# Patient Record
Sex: Male | Born: 1935 | Race: Black or African American | Hispanic: No | Smoking: Never smoker
Health system: Southern US, Community
[De-identification: ages and names within clinical notes are randomized; demographics above are authoritative.]

## PROBLEM LIST (undated history)

## (undated) DIAGNOSIS — R600 Localized edema: Secondary | ICD-10-CM

## (undated) DIAGNOSIS — I5189 Other ill-defined heart diseases: Secondary | ICD-10-CM

## (undated) DIAGNOSIS — M1990 Unspecified osteoarthritis, unspecified site: Secondary | ICD-10-CM

## (undated) DIAGNOSIS — G459 Transient cerebral ischemic attack, unspecified: Secondary | ICD-10-CM

## (undated) DIAGNOSIS — E785 Hyperlipidemia, unspecified: Secondary | ICD-10-CM

## (undated) DIAGNOSIS — R7303 Prediabetes: Secondary | ICD-10-CM

## (undated) DIAGNOSIS — I429 Cardiomyopathy, unspecified: Secondary | ICD-10-CM

## (undated) DIAGNOSIS — E559 Vitamin D deficiency, unspecified: Secondary | ICD-10-CM

## (undated) DIAGNOSIS — I10 Essential (primary) hypertension: Secondary | ICD-10-CM

## (undated) DIAGNOSIS — H409 Unspecified glaucoma: Secondary | ICD-10-CM

## (undated) DIAGNOSIS — D649 Anemia, unspecified: Secondary | ICD-10-CM

## (undated) DIAGNOSIS — F0390 Unspecified dementia without behavioral disturbance: Secondary | ICD-10-CM

## (undated) HISTORY — PX: TONSILLECTOMY: SUR1361

## (undated) HISTORY — DX: Other ill-defined heart diseases: I51.89

## (undated) HISTORY — DX: Hyperlipidemia, unspecified: E78.5

## (undated) HISTORY — DX: Unspecified glaucoma: H40.9

## (undated) HISTORY — DX: Prediabetes: R73.03

## (undated) HISTORY — PX: TRANSURETHRAL RESECTION OF PROSTATE: SHX73

## (undated) HISTORY — PX: CATARACT EXTRACTION: SUR2

## (undated) HISTORY — DX: Anemia, unspecified: D64.9

## (undated) HISTORY — PX: CERVICAL LAMINECTOMY: SHX94

## (undated) HISTORY — DX: Cardiomyopathy, unspecified: I42.9

## (undated) HISTORY — DX: Vitamin D deficiency, unspecified: E55.9

---

## 1985-12-13 HISTORY — PX: LUMBAR SPINE SURGERY: SHX701

## 1998-04-21 ENCOUNTER — Other Ambulatory Visit: Admission: RE | Admit: 1998-04-21 | Discharge: 1998-04-21 | Payer: Self-pay | Admitting: *Deleted

## 1998-04-22 ENCOUNTER — Ambulatory Visit (HOSPITAL_COMMUNITY): Admission: RE | Admit: 1998-04-22 | Discharge: 1998-04-22 | Payer: Self-pay | Admitting: *Deleted

## 1998-04-25 ENCOUNTER — Other Ambulatory Visit: Admission: RE | Admit: 1998-04-25 | Discharge: 1998-04-25 | Payer: Self-pay | Admitting: *Deleted

## 2001-06-02 ENCOUNTER — Encounter: Payer: Self-pay | Admitting: Internal Medicine

## 2001-06-02 ENCOUNTER — Ambulatory Visit (HOSPITAL_COMMUNITY): Admission: RE | Admit: 2001-06-02 | Discharge: 2001-06-02 | Payer: Self-pay | Admitting: Internal Medicine

## 2001-06-26 ENCOUNTER — Ambulatory Visit (HOSPITAL_COMMUNITY): Admission: RE | Admit: 2001-06-26 | Discharge: 2001-06-26 | Payer: Self-pay | Admitting: Gastroenterology

## 2003-09-12 ENCOUNTER — Encounter: Payer: Self-pay | Admitting: Orthopedic Surgery

## 2003-09-13 HISTORY — PX: TOTAL KNEE ARTHROPLASTY: SHX125

## 2003-09-17 ENCOUNTER — Inpatient Hospital Stay (HOSPITAL_COMMUNITY): Admission: RE | Admit: 2003-09-17 | Discharge: 2003-09-20 | Payer: Self-pay | Admitting: Orthopedic Surgery

## 2003-09-17 ENCOUNTER — Encounter: Payer: Self-pay | Admitting: Orthopedic Surgery

## 2003-09-20 ENCOUNTER — Inpatient Hospital Stay (HOSPITAL_COMMUNITY)
Admission: RE | Admit: 2003-09-20 | Discharge: 2003-09-27 | Payer: Self-pay | Admitting: Physical Medicine & Rehabilitation

## 2004-06-17 ENCOUNTER — Ambulatory Visit (HOSPITAL_COMMUNITY): Admission: RE | Admit: 2004-06-17 | Discharge: 2004-06-17 | Payer: Self-pay | Admitting: Internal Medicine

## 2005-10-28 ENCOUNTER — Emergency Department (HOSPITAL_COMMUNITY): Admission: EM | Admit: 2005-10-28 | Discharge: 2005-10-28 | Payer: Self-pay | Admitting: Emergency Medicine

## 2007-06-09 ENCOUNTER — Encounter: Admission: RE | Admit: 2007-06-09 | Discharge: 2007-06-09 | Payer: Self-pay | Admitting: Orthopedic Surgery

## 2009-07-14 ENCOUNTER — Encounter: Admission: RE | Admit: 2009-07-14 | Discharge: 2009-07-14 | Payer: Self-pay | Admitting: Internal Medicine

## 2011-04-30 NOTE — Procedures (Signed)
[facility]  Patient:    Joshua Norton, Joshua Norton                         MRN: [id number] Proc. Date: 06/26/01 Attending:  Petra Kuba, M.D. CC:         Marinus Maw, M.D.   Procedure Report  PROCEDURE:  Colonoscopy.  INDICATIONS:  Patient with a history of colon polyps due for repeat screening.  INFORMED CONSENT:  Consent was signed after risk, benefits, methods and options were thoroughly discussed on multiple occasions in the past.  MEDICINES USED:  Demerol 40 mg, Versed 5 mg.  DESCRIPTION OF PROCEDURE:  Rectal inspection was pertinent for external hemorrhoids.  Digital exam was negative.  The video colonoscope was inserted and easily advanced around the colon to the cecum.  This did require some left abdominal pressure but no position changes.  The cecum was identified by the appendiceal orifice and the ileocecal valve.  No obvious abnormality was seen on insertion.  The scope was slowly withdrawn.  The prep was fairly adequate. Die require lots of washing and suctioning to get adequate visualization.  On slow withdrawal through the colon, other than some occasional diverticula, no polyps were seen.  Once back in the colon, the scope was retroflexed, pertinent for some internal hemorrhoids.  The scope was straightened and readvanced a short ways up the sigmoid.  Air was suctioned and the scope removed.  The patient tolerated the procedure well, and there was no obvious immediate complication.  ENDOSCOPIC DIAGNOSES: 1. External and internal hemorrhoids. 2. Fairly adequate prep. 3. Occasional diverticula, more in the mid colon 4. Otherwise within normal limits to the cecum.  PLAN:  Yearly rectals and guaiacs per Dr. Oneta Rack.  Happy to see back p.r.n. Based on other medical problems, consider repeat screening in five years. DD:  06/26/01 TD:  06/27/01 Job: [id number]

## 2011-04-30 NOTE — Op Note (Signed)
NAME:  VERLE, WHEELING NO.:  [id number]   MEDICAL RECORD NO.:  [id number]                   PATIENT TYPE:  INP   LOCATION:  2899                                 FACILITY:  [facility]   PHYSICIAN:  Burnard Bunting, M.D.                 DATE OF BIRTH:  [birth year]   DATE OF PROCEDURE:  09/17/2003  DATE OF DISCHARGE:                                 OPERATIVE REPORT   PREOPERATIVE DIAGNOSIS:  Left knee arthritis.   POSTOPERATIVE DIAGNOSIS:  Left knee arthritis.   OPERATION PERFORMED:  Left total knee arthroplasty.   SURGEON:  Burnard Bunting, M.D.   ASSISTANT:  Jerolyn Shin. Tresa Res, M.D.   ANESTHESIA:  General endotracheal plus postoperative femoral nerve block  plus intra-articular Marcaine and morphine.   ESTIMATED BLOOD LOSS:  .   DRAINS:  Hemovac times one.   TOURNIQUET TIME:  One hour and 42 minutes at .   DESCRIPTION OF PROCEDURE:  The patient was brought to the operating room  where general endotracheal anesthesia was induced.  Preoperative IV  antibiotics were administered.  The left leg including the foot was prepped  with DuraPrep solution and draped in a sterile manner.  The operative field  was covered with Ioban.  The leg was elevated and exsanguinated with the  Esmarch wrap.  The tourniquet was inflated.  Anterior incision was made.  Median parapatellar arthrotomy was performed.  Precise location of the  arthrotomy was marked with a #1 Vicryl suture at the superomedial border of  the patella.  The lateral aspect of the fat pad was then excised for  visualization.  The lateral patellofemoral ligament was released, distal  anterior aspect of the femur was cleared of soft tissue for visualization of  the anterior Chamfer cut.  The ACL and PCL were released.  Medial periosteal  stripping was performed because of the patient's preoperative varus  contracture.  At this time the knee was flexed and patella was everted.  Distal femoral cut  12 mm was then performed in 5 degrees of valgus.  Collateral ligaments were protected.  The rotational alignment guide was  then placed and the femur was noted to fit well with a size 11.  External  rotation was placed parallel to the epicondylar axis.  The anterior,  posterior and Chamfer cuts were then performed.  At this time the tibia was  then prepared with collateral ligaments well retracted.  A size 9 tibial  tray was noted to fit well on the tibial surface.  Intramedullary alignment  was utilized.  A 10 mm resection was made off of the least affected lateral  tibial plateau.  At this time the box cut was made on the femur.  The  remnant PCL was excised.  The patellar cut was then made using free hand  technique.  A 28 mm cut was then taken down  to a 20 mm patella was taken  down to a 15 mm patella.  A size 9 patellar trial fit nicely on the patella.  At this time the trial inserts were placed.  The patient had excellent range  of motion with no lift off and good patellar tracking.  Rotation of the  tibial tray was marked.  A keel punch was performed.  Cut bony surfaces were  irrigated, components were then cemented into position with a size 9 tibial  tray, 11 femur, 9 patella with 10 mm inserts. Excess cement was removed.  Cement was allowed to harden and the real 10 polyethylene insert was then  placed into position.  Again the patella had good tracking.  The knee had  about 2 to 3 degrees of hyperextension and the collateral ligament stability  was excellent in both full extension, 30 degrees of flexion, 90 degrees of  flexion.  At this time tourniquet was released.  Bleedings points  encountered were controlled using electrocautery.  Joint was thoroughly  irrigated.  __________ patellar arthrotomy was closed with the knee in 30  degrees of flexion using #1 Vicryl suture.  At this time the Hemovac drain  was placed.  The skin was then closed using interrupted inverted 2-0 Vicryl   followed by skin staples.  Knee immobilizer was placed.  It should be noted  that 20mL of 0.25% Marcaine without epinephrine and 8 mg of morphine was  injected into the knee prior to placing the bulky compressive dressing and  knee immobilizer.                                                Burnard Bunting, M.D.    GSD/MEDQ  D:  09/17/2003  T:  09/17/2003  Job:  [id number]

## 2011-04-30 NOTE — Discharge Summary (Signed)
   NAME:  Joshua Norton, Joshua Norton NO.:  [id number]   MEDICAL RECORD NO.:  [id number]                   PATIENT TYPE:  INP   LOCATION:  5017                                 FACILITY:  [facility]   PHYSICIAN:  Burnard Bunting, M.D.                 DATE OF BIRTH:  [birth year]   DATE OF ADMISSION:  09/17/2003  DATE OF DISCHARGE:  09/20/2003                                 DISCHARGE SUMMARY   DISCHARGE DIAGNOSIS:  Left knee arthritis.   SECONDARY DIAGNOSES:  1. Hypertension.  2. Coronary artery disease.  3. Gout.   OPERATIONS/PROCEDURES:  Left total knee replacement performed on September 17, 2003.   HOSPITAL COURSE:  The patient was admitted to the orthopaedic service on  September 17, 2003.  At that time, he underwent left total knee replacement.  The patient tolerated the procedure well without any complications,  transferred to the recovery room in stable condition.  The patient had a  little chest pain postoperatively which was evaluated by cardiology  consultants who felt like it was not cardiac in origin.  The patient was  started on Coumadin for DVT prophylaxis.  The patient's hematocrit was 38 on  postoperative day #2.  He was started on physical therapy and CPM for knee  range of motion.  The patient was up to 65 degrees on the CPM by  postoperative day #3.  Hemoglobin at that time was 10.9.  His incision was  intact at that time.  He was transferred to rehab in good condition on  September 20, 2003.   DISCHARGE MEDICATIONS:  1. Coumadin.  2. Percocet.  3. Preoperative medications.   I will follow up with him in a week.  He should continue weightbearing as  tolerated.                                                 Burnard Bunting, M.D.    GSD/MEDQ  D:  10/06/2003  T:  10/06/2003  Job:  [id number]

## 2011-04-30 NOTE — Discharge Summary (Signed)
NAME:  Joshua Norton, Joshua Norton NO.:  [id number]   MEDICAL RECORD NO.:  [id number]                   PATIENT TYPE:  IPS   LOCATION:  4148                                 FACILITY:  [facility]   PHYSICIAN:  Ellwood Dense, M.D.                DATE OF BIRTH:  11-09-1919   DATE OF ADMISSION:  09/20/2003  DATE OF DISCHARGE:  09/27/2003                                 DISCHARGE SUMMARY   DISCHARGE DIAGNOSES:  1. Left total knee arthroplasty, secondary to degenerative joint disease,     September 17, 2003.  2. Pain management, Coumadin for deep venous thrombosis prophylaxis.  3. History of transient ischemic attack.  4. Hypertension.   HISTORY OF PRESENT ILLNESS:  An 77 year old male, admitted September 17, 2003,  with end-stage degenerative joint disease of the left knee and no relief  with conservative care.   HOSPITAL COURSE:  Underwent a left total knee arthroplasty, September 17, 2003, per Dr. August Saucer.  Placed on Coumadin for deep venous thrombosis  prophylaxis, weightbearing as tolerated.   Postoperative nonspecific chest pain.  Card[facility] services from [facility] consulted.  Cardiac enzymes negative.  EKG:  Normal  sinus rhythm.  Not felt to be cardiac-related.  No further episodes  reported.   He was total assist for transfers, ambulating 10 feet with a rolling walker.   Latest chemistries with INR 1.4; hemoglobin 10.9.  Chest x-ray negative.   Admitted for a comprehensive rehab program.   PAST MEDICAL HISTORY:  See discharge diagnoses.   PAST SURGICAL HISTORY:  1. Cervical spine surgery.  2. Rectal surgery.  3. Tonsillectomy.   ALLERGIES:  None.   SOCIAL HISTORY:  No alcohol or tobacco.   MEDICATIONS PRIOR TO ADMISSION:  1. Toprol XL 50 mg daily.  2. Imdur 60 mg daily.  3. Tiazac 240 mg daily.  4. Accupril 40 mg daily.  5. Aspirin 325 mg daily.  6. Lasix 40 mg daily.   SOCIAL HISTORY:  Lives with grand-daughter and two  great grandchildren in  [place].  Independent with a cane prior to admission and driving.  One-  level home, two steps to entry.  Grand-daughter with multiple sclerosis and  limited assistance.  Grandson next door to assist as needed.   HOSPITAL COURSE/REHABILITATION:  Patient did well while on rehabilitation  services with therapies initiated on a b.i.d. basis.  The following issues  are followed on patient's rehab course:   1. Pertaining to Mr. Knutzen left total knee arthroplasty:  Surgical site     healing nicely.  No signs of infection.  He was weightbearing as     tolerated.  Active range of motion, 85-90 degrees, ambulating extended     distances with a rolling walker.   1. Pain control with the use of Tylox and good results.  He remained on     Coumadin for deep  venous thrombosis prophylaxis, with latest INR of 2.1.     He would complete Coumadin protocol.   1. He had a history of transient ischemic attack.  He would resume aspirin     therapy after Coumadin completed.  Blood pressures monitored on multiple     home medications of Toprol, Imdur, Cardizem, Lasix and Lisinopril.  No     report of orthostatic blood pressure changes.   He had no bladder or bowel disturbances.  Latest urinalysis study showed no  growth.   Overall for his function mobility, he remained independent in his room.  Home Health therapies have been arranged.  Modified independence for  activities of daily living.   LABORATORY DATA:  Latest labs showed an INR of 2.1; hemoglobin 11.7,  hematocrit 34.2; sodium 136, potassium 4.1, BUN 9, creatinine 1.0.   DISCHARGE MEDICATIONS:  1. Include Coumadin daily, latest dose of 5 mg to be completed on October 18, 2003.  2. Toprol XL 50 mg daily.  3. Imdur 60 mg daily.  4. Cardizem 240 mg daily.  5. Lisinopril 40 mg daily.  6. Lasix 40 mg daily.  7. Protonix 40 mg daily.  8. Tylox as needed, pain.   ACTIVITY:  As tolerated.   DIET:  Was  regular.   WOUND CARE:  Cleanse incision daily warm soap and water.    SPECIAL INSTRUCTIONS:  Home Health nurse check prothrombin time on Monday,  September 30, 2003, per protocol.   FOLLOW UP:  Patient should follow up with Dr. Rise Paganini.  Call for  appointment.      Joshua Norton, P.A.                     Ellwood Dense, M.D.    DA/MEDQ  D:  09/26/2003  T:  09/26/2003  Job:  [id number]   cc:   Rise Paganini, M.D.   Elisabeth Most, M.D.  [facility]

## 2011-08-13 ENCOUNTER — Emergency Department (HOSPITAL_COMMUNITY)
Admission: EM | Admit: 2011-08-13 | Discharge: 2011-08-13 | Disposition: A | Discharge status: Home / Self Care | Payer: Medicare HMO | Attending: Emergency Medicine | Admitting: Emergency Medicine

## 2011-08-13 ENCOUNTER — Emergency Department (HOSPITAL_COMMUNITY): Payer: Medicare HMO

## 2011-08-13 DIAGNOSIS — K219 Gastro-esophageal reflux disease without esophagitis: Secondary | ICD-10-CM | POA: Insufficient documentation

## 2011-08-13 DIAGNOSIS — Z79899 Other long term (current) drug therapy: Secondary | ICD-10-CM | POA: Insufficient documentation

## 2011-08-13 DIAGNOSIS — R110 Nausea: Secondary | ICD-10-CM | POA: Insufficient documentation

## 2011-08-13 DIAGNOSIS — I10 Essential (primary) hypertension: Secondary | ICD-10-CM | POA: Insufficient documentation

## 2011-08-13 DIAGNOSIS — R079 Chest pain, unspecified: Secondary | ICD-10-CM | POA: Insufficient documentation

## 2011-08-13 DIAGNOSIS — E7800 Pure hypercholesterolemia, unspecified: Secondary | ICD-10-CM | POA: Insufficient documentation

## 2011-08-13 LAB — DIFFERENTIAL
Basophils Relative: 1 % (ref 0–1)
Eosinophils Absolute: 0.1 10*3/uL (ref 0.0–0.7)
Lymphs Abs: 1.3 10*3/uL (ref 0.7–4.0)
Monocytes Relative: 11 % (ref 3–12)
Neutro Abs: 4.7 10*3/uL (ref 1.7–7.7)
Neutrophils Relative %: 68 % (ref 43–77)

## 2011-08-13 LAB — CBC
Hemoglobin: 14.6 g/dL (ref 13.0–17.0)
MCH: 31.4 pg (ref 26.0–34.0)
Platelets: 250 10*3/uL (ref 150–400)
RBC: 4.65 MIL/uL (ref 4.22–5.81)
WBC: 6.9 10*3/uL (ref 4.0–10.5)

## 2011-08-13 LAB — POCT I-STAT, CHEM 8
BUN: 15 mg/dL (ref 6–23)
Chloride: 97 mEq/L (ref 96–112)
HCT: 45 % (ref 39.0–52.0)
Sodium: 133 mEq/L — ABNORMAL LOW (ref 135–145)
TCO2: 27 mmol/L (ref 0–100)

## 2011-08-13 LAB — POCT I-STAT TROPONIN I: Troponin i, poc: 0.04 ng/mL (ref 0.00–0.08)

## 2012-06-07 ENCOUNTER — Inpatient Hospital Stay (HOSPITAL_COMMUNITY)
Admission: EM | Admit: 2012-06-07 | Discharge: 2012-06-10 | DRG: 293 | Disposition: A | Discharge status: Home Health | Payer: Medicare HMO | Attending: Cardiology | Admitting: Cardiology

## 2012-06-07 ENCOUNTER — Emergency Department (HOSPITAL_COMMUNITY): Payer: Medicare HMO

## 2012-06-07 ENCOUNTER — Encounter (HOSPITAL_COMMUNITY): Payer: Self-pay | Admitting: Radiology

## 2012-06-07 DIAGNOSIS — M1990 Unspecified osteoarthritis, unspecified site: Secondary | ICD-10-CM | POA: Diagnosis present

## 2012-06-07 DIAGNOSIS — I10 Essential (primary) hypertension: Secondary | ICD-10-CM | POA: Diagnosis present

## 2012-06-07 DIAGNOSIS — I509 Heart failure, unspecified: Secondary | ICD-10-CM

## 2012-06-07 DIAGNOSIS — Z96659 Presence of unspecified artificial knee joint: Secondary | ICD-10-CM

## 2012-06-07 DIAGNOSIS — I498 Other specified cardiac arrhythmias: Secondary | ICD-10-CM | POA: Diagnosis present

## 2012-06-07 HISTORY — DX: Unspecified osteoarthritis, unspecified site: M19.90

## 2012-06-07 HISTORY — DX: Essential (primary) hypertension: I10

## 2012-06-07 HISTORY — DX: Localized edema: R60.0

## 2012-06-07 LAB — COMPREHENSIVE METABOLIC PANEL
ALT: 10 U/L (ref 0–53)
Alkaline Phosphatase: 67 U/L (ref 39–117)
BUN: 20 mg/dL (ref 6–23)
CO2: 29 mEq/L (ref 19–32)
Chloride: 90 mEq/L — ABNORMAL LOW (ref 96–112)
GFR calc Af Amer: 58 mL/min — ABNORMAL LOW (ref >90)
Glucose, Bld: 89 mg/dL (ref 70–99)
Potassium: 3.9 mEq/L (ref 3.5–5.1)
Sodium: 131 mEq/L — ABNORMAL LOW (ref 135–145)
Total Bilirubin: 0.6 mg/dL (ref 0.3–1.2)
Total Protein: 7.2 g/dL (ref 6.0–8.3)

## 2012-06-07 LAB — CBC
Hemoglobin: 15.8 g/dL (ref 13.0–17.0)
MCV: 86.4 fL (ref 78.0–100.0)
Platelets: 228 10*3/uL (ref 150–400)
RBC: 5.15 MIL/uL (ref 4.22–5.81)
WBC: 4.6 10*3/uL (ref 4.0–10.5)

## 2012-06-07 LAB — CBC WITH DIFFERENTIAL/PLATELET
Eosinophils Absolute: 0.1 10*3/uL (ref 0.0–0.7)
Hemoglobin: 14.4 g/dL (ref 13.0–17.0)
Lymphocytes Relative: 39 % (ref 12–46)
Lymphs Abs: 1.4 10*3/uL (ref 0.7–4.0)
MCH: 30.9 pg (ref 26.0–34.0)
Monocytes Relative: 11 % (ref 3–12)
Neutro Abs: 1.6 10*3/uL — ABNORMAL LOW (ref 1.7–7.7)
Neutrophils Relative %: 45 % (ref 43–77)
Platelets: 198 10*3/uL (ref 150–400)
RBC: 4.66 MIL/uL (ref 4.22–5.81)
WBC: 3.6 10*3/uL — ABNORMAL LOW (ref 4.0–10.5)

## 2012-06-07 LAB — CREATININE, SERUM
Creatinine, Ser: 1.08 mg/dL (ref 0.50–1.35)
GFR calc Af Amer: 68 mL/min — ABNORMAL LOW (ref >90)

## 2012-06-07 LAB — PRO B NATRIURETIC PEPTIDE: Pro B Natriuretic peptide (BNP): 1327 pg/mL — ABNORMAL HIGH (ref 0–450)

## 2012-06-07 LAB — CARDIAC PANEL(CRET KIN+CKTOT+MB+TROPI)
CK, MB: 3 ng/mL (ref 0.3–4.0)
Relative Index: INVALID (ref 0.0–2.5)
Total CK: 92 U/L (ref 7–232)

## 2012-06-07 MED ORDER — METOPROLOL SUCCINATE ER 100 MG PO TB24
100.0000 mg | ORAL_TABLET | Freq: Every day | ORAL | Status: DC
  Administered 2012-06-08 – 2012-06-09 (×2): 100 mg via ORAL
  Filled 2012-06-07 (×4): qty 1

## 2012-06-07 MED ORDER — HEPARIN SODIUM (PORCINE) 5000 UNIT/ML IJ SOLN
5000.0000 [IU] | Freq: Three times a day (TID) | INTRAMUSCULAR | Status: DC
  Administered 2012-06-07 – 2012-06-10 (×8): 5000 [IU] via SUBCUTANEOUS
  Filled 2012-06-07 (×11): qty 1

## 2012-06-07 MED ORDER — FUROSEMIDE 10 MG/ML IJ SOLN
40.0000 mg | Freq: Two times a day (BID) | INTRAMUSCULAR | Status: DC
  Administered 2012-06-07 – 2012-06-08 (×2): 40 mg via INTRAVENOUS
  Filled 2012-06-07 (×4): qty 4

## 2012-06-07 MED ORDER — POTASSIUM CHLORIDE CRYS ER 20 MEQ PO TBCR
40.0000 meq | EXTENDED_RELEASE_TABLET | Freq: Every day | ORAL | Status: DC
  Administered 2012-06-07 – 2012-06-10 (×4): 40 meq via ORAL
  Filled 2012-06-07 (×4): qty 2

## 2012-06-07 MED ORDER — ONDANSETRON HCL 4 MG/2ML IJ SOLN: 4.0000 mg | Freq: Four times a day (QID) | INTRAMUSCULAR | Status: DC | PRN

## 2012-06-07 MED ORDER — ASPIRIN EC 81 MG PO TBEC
81.0000 mg | DELAYED_RELEASE_TABLET | Freq: Every day | ORAL | Status: DC
  Administered 2012-06-08 – 2012-06-10 (×3): 81 mg via ORAL
  Filled 2012-06-07 (×4): qty 1

## 2012-06-07 MED ORDER — ISOSORBIDE MONONITRATE ER 60 MG PO TB24
120.0000 mg | ORAL_TABLET | Freq: Every day | ORAL | Status: DC
  Administered 2012-06-08 – 2012-06-10 (×3): 120 mg via ORAL
  Filled 2012-06-07 (×4): qty 2

## 2012-06-07 MED ORDER — POLYETHYLENE GLYCOL 3350 17 G PO PACK
17.0000 g | PACK | Freq: Every day | ORAL | Status: DC
  Administered 2012-06-08 – 2012-06-09 (×2): 17 g via ORAL
  Filled 2012-06-07 (×4): qty 1

## 2012-06-07 MED ORDER — SODIUM CHLORIDE 0.9 % IJ SOLN
3.0000 mL | Freq: Two times a day (BID) | INTRAMUSCULAR | Status: DC
  Administered 2012-06-07 – 2012-06-10 (×6): 3 mL via INTRAVENOUS

## 2012-06-07 MED ORDER — CLONIDINE HCL 0.1 MG PO TABS
0.1000 mg | ORAL_TABLET | Freq: Every day | ORAL | Status: DC
  Administered 2012-06-07 – 2012-06-10 (×4): 0.1 mg via ORAL
  Filled 2012-06-07 (×4): qty 1

## 2012-06-07 MED ORDER — QUINAPRIL HCL 10 MG PO TABS: 40.0000 mg | ORAL_TABLET | Freq: Every day | ORAL | Status: DC

## 2012-06-07 MED ORDER — AMLODIPINE BESYLATE 5 MG PO TABS
5.0000 mg | ORAL_TABLET | Freq: Every day | ORAL | Status: DC
Start: 2012-06-08 — End: 2012-06-10
  Administered 2012-06-08 – 2012-06-10 (×3): 5 mg via ORAL
  Filled 2012-06-07 (×4): qty 1

## 2012-06-07 MED ORDER — DOCUSATE SODIUM 100 MG PO CAPS
100.0000 mg | ORAL_CAPSULE | Freq: Two times a day (BID) | ORAL | Status: DC
Start: 2012-06-07 — End: 2012-06-10
  Administered 2012-06-07 – 2012-06-10 (×6): 100 mg via ORAL
  Filled 2012-06-07 (×8): qty 1

## 2012-06-07 MED ORDER — METOLAZONE 2.5 MG PO TABS
2.5000 mg | ORAL_TABLET | Freq: Every day | ORAL | Status: DC
  Administered 2012-06-07 – 2012-06-10 (×4): 2.5 mg via ORAL
  Filled 2012-06-07 (×5): qty 1

## 2012-06-07 MED ORDER — PANTOPRAZOLE SODIUM 40 MG PO TBEC
40.0000 mg | DELAYED_RELEASE_TABLET | Freq: Every day | ORAL | Status: DC
  Administered 2012-06-07 – 2012-06-09 (×3): 40 mg via ORAL
  Filled 2012-06-07 (×3): qty 1

## 2012-06-07 MED ORDER — SODIUM CHLORIDE 0.9 % IJ SOLN: 3.0000 mL | INTRAMUSCULAR | Status: DC | PRN

## 2012-06-07 MED ORDER — LISINOPRIL 40 MG PO TABS
40.0000 mg | ORAL_TABLET | Freq: Every day | ORAL | Status: DC
  Administered 2012-06-08 – 2012-06-09 (×2): 40 mg via ORAL
  Filled 2012-06-07 (×3): qty 1

## 2012-06-07 MED ORDER — SODIUM CHLORIDE 0.9 % IV SOLN: 250.0000 mL | INTRAVENOUS | Status: DC | PRN

## 2012-06-07 MED ORDER — ACETAMINOPHEN 325 MG PO TABS: 650.0000 mg | ORAL_TABLET | ORAL | Status: DC | PRN

## 2012-06-07 NOTE — ED Notes (Signed)
Legs elevated 

## 2012-06-07 NOTE — ED Provider Notes (Signed)
History     CSN: [id number]  Arrival date & time 06/07/12  1253   First MD Initiated Contact with Patient 06/07/12 1342      Chief Complaint  Patient presents with  . Extremity Pain    (Consider location/radiation/quality/duration/timing/severity/associated sxs/prior treatment) Patient is a 78 y.o. male presenting with shortness of breath. The history is provided by the patient (pt complains of right arm pain.  swelling to legs and sob). No language interpreter was used.  Shortness of Breath  The current episode started today. The problem occurs frequently. The problem has been gradually improving. The problem is moderate. Nothing relieves the symptoms. Nothing aggravates the symptoms. Associated symptoms include shortness of breath. Pertinent negatives include no chest pain and no cough. The Heimlich maneuver was not attempted. He has not inhaled smoke recently. He has had no prior steroid use. He has had prior hospitalizations. He has had no prior intubations.    History reviewed. No pertinent past medical history.  History reviewed. No pertinent past surgical history.  History reviewed. No pertinent family history.  History  Substance Use Topics  . Smoking status: Not on file  . Smokeless tobacco: Not on file  . Alcohol Use: Not on file      Review of Systems  Constitutional: Negative for fatigue.  HENT: Negative for congestion, sinus pressure and ear discharge.   Eyes: Negative for discharge.  Respiratory: Positive for shortness of breath. Negative for cough.   Cardiovascular: Negative for chest pain.  Gastrointestinal: Negative for abdominal pain and diarrhea.  Genitourinary: Negative for frequency and hematuria.  Musculoskeletal: Negative for back pain.       Swelling in legs  Skin: Negative for rash.  Neurological: Negative for seizures and headaches.  Hematological: Negative.   Psychiatric/Behavioral: Negative for hallucinations.    Allergies  Review of  patient's allergies indicates no known allergies.  Home Medications   Current Outpatient Rx  Name Route Sig Dispense Refill  . AMLODIPINE BESYLATE 5 MG PO TABS Oral Take 5 mg by mouth daily.    Marland Kitchen CLONIDINE HCL 0.1 MG PO TABS Oral Take 0.1 mg by mouth daily.    Marland Kitchen VITAMIN B-12 PO Oral Take 1 tablet by mouth daily.    . FUROSEMIDE 40 MG PO TABS Oral Take 40 mg by mouth 2 (two) times daily.    . INDOMETHACIN 50 MG PO CAPS Oral Take 50 mg by mouth daily.    . ISOSORBIDE MONONITRATE ER 120 MG PO TB24 Oral Take 120 mg by mouth daily.    Marland Kitchen METOLAZONE 2.5 MG PO TABS Oral Take 2.5 mg by mouth daily as needed. For high blood pressure    . METOPROLOL SUCCINATE ER 100 MG PO TB24 Oral Take 100 mg by mouth daily. Take with or immediately following a meal.    . PANTOPRAZOLE SODIUM 40 MG PO TBEC Oral Take 40 mg by mouth daily.    . QUINAPRIL HCL 40 MG PO TABS Oral Take 40 mg by mouth at bedtime.    Marland Kitchen VITAMIN D (ERGOCALCIFEROL) PO Oral Take 1 tablet by mouth daily.      BP 172/87  Pulse 54  Temp 98.4 F (36.9 C) (Oral)  Resp 18  SpO2 98%  Physical Exam  Constitutional: He is oriented to person, place, and time. He appears well-developed.  HENT:  Head: Normocephalic and atraumatic.  Eyes: Conjunctivae and EOM are normal. No scleral icterus.  Neck: Neck supple. No thyromegaly present.  Cardiovascular: Normal rate and  regular rhythm.  Exam reveals no gallop and no friction rub.   No murmur heard. Pulmonary/Chest: No stridor. He has no wheezes. He has no rales. He exhibits no tenderness.  Abdominal: He exhibits no distension. There is no tenderness. There is no rebound.  Musculoskeletal: Normal range of motion. He exhibits edema.       3 plus edema in lower extr.  Lymphadenopathy:    He has no cervical adenopathy.  Neurological: He is oriented to person, place, and time. Coordination normal.  Skin: No rash noted. No erythema.  Psychiatric: He has a normal mood and affect. His behavior is normal.      ED Course  Procedures (including critical care time)  Labs Reviewed  CBC WITH DIFFERENTIAL - Abnormal; Notable for the following:    WBC 3.6 (*)     MCHC 36.1 (*)     Neutro Abs 1.6 (*)     Basophils Relative 2 (*)     All other components within normal limits  COMPREHENSIVE METABOLIC PANEL - Abnormal; Notable for the following:    Sodium 131 (*)     Chloride 90 (*)     GFR calc non Af Amer 50 (*)     GFR calc Af Amer 58 (*)     All other components within normal limits  PRO B NATRIURETIC PEPTIDE - Abnormal; Notable for the following:    Pro B Natriuretic peptide (BNP) 1327.0 (*)     All other components within normal limits  TROPONIN I   Dg Chest Portable 1 View  06/07/2012  *RADIOLOGY REPORT*  Clinical Data: Swelling of the extremities.  Shortness of breath.  PORTABLE CHEST - 1 VIEW  Comparison: Chest x-ray 08/13/2011.  Findings: Lung volumes are normal.  There are some small nodular densities seen in the right lung, most pronounced at the right base, favored to represent calcified granulomas (similar to prior study from 08/13/2011).  No other larger more suspicious appearing pulmonary nodules or masses are otherwise identified.  No acute consolidative airspace disease.  Linear opacity in the right base is favored to reflect subsegmental atelectasis.  No definite pleural effusions (right costophrenic sulcus is excluded from the margin of the image).  Pulmonary venous congestion without frank pulmonary edema.  Mild cardiomegaly is unchanged. The patient is rotated to the left on today's exam, resulting in distortion of the mediastinal contours and reduced diagnostic sensitivity and specificity for mediastinal pathology.  Atherosclerosis of the thoracic aorta.  IMPRESSION: 1.  Mild cardiomegaly with pulmonary venous congestion without frank pulmonary edema. 2.  Atherosclerosis. 3.  Multiple small dense right-sided pulmonary nodules are favored to represent calcified granulomas.  These are  similar to prior study 08/13/2011.  Original Report Authenticated By: Florencia Reasons, M.D.     No diagnosis found. Cardiology to see pt   Date: 06/07/2012  Rate55  Rhythm: normal sinus rhythm  With pvc  QRS Axis: left  Intervals: normal  ST/T Wave abnormalities: nonspecific ST changes  Conduction Disutrbances:left bundle branch block  Narrative Interpretation:   Old EKG Reviewed: unchanged   MDM          Benny Lennert, MD 06/07/12 1540

## 2012-06-07 NOTE — ED Notes (Signed)
MD at bedside. 

## 2012-06-07 NOTE — H&P (Signed)
[facility]    ADMISSION HISTORY & PHYSICAL   Primary Cardiologist: Nanetta Batty, M.D. Chief Complaint:   Worsening Swelling  Shortness of Breath  HPI:  This is a 78 y.o. male with a past medical history significant for hypertension, chronic edema and osteoarthritis who presented [place] emergency room today due to right arm pain.  He states that he started having this pain over the weekend. It is localized in the left elbow and radiated down toward the left hand it was a sharp pinching type pain. Is not currently having symptoms now. In addition to this symptom he also noticed it over the past weekend he started having worsening of his regular edema, it is actually improved now. He is noted no worsening shortness of breath than from his baseline but denies any PND or orthopnea. He denies any chest pressure or pain with rest or exertion he does note shortness of breath at rest and with exertion. Although he has PVCs on his EKG he denies any palpitations. He has not complained of any symptoms of syncope/near-syncope, or TIA or CVA, but he has noted some intermittent lightheadedness. He also notes intermittent nausea, and chronic constipation same he has maybe one bowel movement a week. He denies any melena, hematochezia, hematuria or dysuria. Cardiovascular ROS: positive for - dyspnea on exertion, edema and shortness of breath negative for - chest pain, irregular heartbeat, loss of consciousness, murmur, orthopnea, paroxysmal nocturnal dyspnea, rapid heart rate or -- he does not feel the PVCs noted on ECG  PMHx:  Past Medical History  Diagnosis Date  . Edema of both legs Pre 2010  . HTN (hypertension), benign     On multiple medications  . Osteoarthritis (arthritis due to wear and tear of joints)     S/p L Knee Arthroplasty   Past Surgical History  Procedure Date  . Left knee arthroplasty 09/2003    Back Surgery many years ago  FAMHx:  Family History    Problem Relation Age of Onset  . Family history unknown: Yes.  But at 78 years old, this would be noncontributory.   SOCHx:   reports that he has never smoked. He has never used smokeless tobacco. He reports that he does not drink alcohol. His drug history not on file.  ALLERGIES:  No Known Allergies  ROS: A comprehensive review of systems was performed -- Pertinent items are noted in HPI.  Otherwise recent uses was negative.  HOME MEDS: Amlodipine 5 mg by mouth daily, Clonidine 0.1 mg daily, vitamin B 12 1 tablet daily, furosemide 40 mm by mouth twice a day, indomethacin 50 mg when necessary, Imdur 120 mg by mouth daily, Zaroxolyn 2.5 mg by mouth twice weekly, metoprolol succinate 100 mg daily, pantoprazole 40 mg by mouth each bedtime, quinapril 40 mg by mouth each bedtime, vitamin D 1 tab daily  LABS/IMAGING: Reviewed Results for orders placed during the hospital encounter of 06/07/12 (from the past 48 hour(s))  CBC WITH DIFFERENTIAL     Status: Abnormal   Collection Time   06/07/12  1:52 PM      Component Value Range Comment   WBC 3.6 (*) 4.0 - 10.5 K/uL    RBC 4.66  4.22 - 5.81 MIL/uL    Hemoglobin 14.4  13.0 - 17.0 g/dL    HCT 40.9  81.1 - 91.4 %    MCV 85.6  78.0 - 100.0 fL    MCH 30.9  26.0 - 34.0 pg  MCHC 36.1 (*) 30.0 - 36.0 g/dL    RDW 96.0  45.4 - 09.8 %    Platelets 198  150 - 400 K/uL    Neutrophils Relative 45  43 - 77 %    Neutro Abs 1.6 (*) 1.7 - 7.7 K/uL    Lymphocytes Relative 39  12 - 46 %    Lymphs Abs 1.4  0.7 - 4.0 K/uL    Monocytes Relative 11  3 - 12 %    Monocytes Absolute 0.4  0.1 - 1.0 K/uL    Eosinophils Relative 4  0 - 5 %    Eosinophils Absolute 0.1  0.0 - 0.7 K/uL    Basophils Relative 2 (*) 0 - 1 %    Basophils Absolute 0.1  0.0 - 0.1 K/uL   COMPREHENSIVE METABOLIC PANEL     Status: Abnormal   Collection Time   06/07/12  1:52 PM      Component Value Range Comment   Sodium 131 (*) 135 - 145 mEq/L    Potassium 3.9  3.5 - 5.1 mEq/L     Chloride 90 (*) 96 - 112 mEq/L    CO2 29  19 - 32 mEq/L    Glucose, Bld 89  70 - 99 mg/dL    BUN 20  6 - 23 mg/dL    Creatinine, Ser 1.19  0.50 - 1.35 mg/dL    Calcium 9.6  8.4 - 14.7 mg/dL    Total Protein 7.2  6.0 - 8.3 g/dL    Albumin 3.7  3.5 - 5.2 g/dL    AST 14  0 - 37 U/L    ALT 10  0 - 53 U/L    Alkaline Phosphatase 67  39 - 117 U/L    Total Bilirubin 0.6  0.3 - 1.2 mg/dL    GFR calc non Af Amer 50 (*) >90 mL/min    GFR calc Af Amer 58 (*) >90 mL/min   PRO B NATRIURETIC PEPTIDE     Status: Abnormal   Collection Time   06/07/12  1:53 PM      Component Value Range Comment   Pro B Natriuretic peptide (BNP) 1327.0 (*) 0 - 450 pg/mL   TROPONIN I     Status: Normal   Collection Time   06/07/12  1:53 PM      Component Value Range Comment   Troponin I <0.30  <0.30 ng/mL    Dg Chest Portable 1 View  06/07/2012  *RADIOLOGY REPORT*  Clinical Data: Swelling of the extremities.  Shortness of breath.  PORTABLE CHEST - 1 VIEW  Comparison: Chest x-ray 08/13/2011.  Findings: Lung volumes are normal.  There are some small nodular densities seen in the right lung, most pronounced at the right base, favored to represent calcified granulomas (similar to prior study from 08/13/2011).  No other larger more suspicious appearing pulmonary nodules or masses are otherwise identified.  No acute consolidative airspace disease.  Linear opacity in the right base is favored to reflect subsegmental atelectasis.  No definite pleural effusions (right costophrenic sulcus is excluded from the margin of the image).  Pulmonary venous congestion without frank pulmonary edema.  Mild cardiomegaly is unchanged. The patient is rotated to the left on today's exam, resulting in distortion of the mediastinal contours and reduced diagnostic sensitivity and specificity for mediastinal pathology.  Atherosclerosis of the thoracic aorta.  IMPRESSION: 1.  Mild cardiomegaly with pulmonary venous congestion without frank pulmonary edema.  2.  Atherosclerosis. 3.  Multiple small dense right-sided pulmonary nodules are favored to represent calcified granulomas.  These are similar to prior study 08/13/2011.  Original Report Authenticated By: Florencia Reasons, M.D.   -- personally reviewed  ECG: Sinus bradycardia  rate 49 beats a minute with PVCs in the form of Ventricular Bigeminy, there is a Left Bundle Branch Pattern with Left Axis Deviation.  EXAM: VITALS: Blood pressure 139/95, pulse 60, temperature 98.4 F (36.9 C), temperature source Oral, resp. rate 18, SpO2 98.00%. General appearance: alert, cooperative, appears stated age, no distress, mildly obese and Pleasant mood and affect Neck: no adenopathy, no carotid bruit, supple, symmetrical, trachea midline, thyroid not enlarged, symmetric, no tenderness/mass/nodules and No clear JVD noted but there is jugular venous pulsations noted along the angle of the jaw and left side. Lungs: clear to auscultation bilaterally, normal percussion bilaterally and Nonlabored, no wheezes rales rhonchi. Heart: Bradycardic with PVCs, normal S1 split S2, no murmurs or gallops. Nondisplaced PMI Abdomen: Soft, nondistended, mildly tender with firm palpable loops of bowel. Clear to percussion with no palpable hepato- spleno-megaly Extremities: edema 4+ bilateral lower extremities below the knees with minimal edema above the knees. and Dry scaly skin but no venous stasis changes noted Pulses: 2+ and symmetric Palpable despite pitting edema Skin: Skin color, texture, turgor normal. No rashes or lesions or Dry scaly skin of the lower extremity Neurologic: Alert and oriented X 3, normal strength and tone. Normal symmetric reflexes. Normal coordination and gait Cranial nerves: normal Most distal examination of the right arm he shows me where the symptom began just distal to the ulnar articulation of the elbow radiating to the wrist.  It is not reproducible by movement or palpation.  IMPRESSION: Mr.  Joshua Norton is presenting with worsening edema and shortness of breath. No prior knowledge of his cardiac function detail to determine if this is CHF what the etiology is.  I do not feel in his right arm pain is of cardiac in nature it is more musculoskeletal versus neurologic impingement. The fact that he had some shortness of breath with pulmonary vascular congestion on chest x-ray raises the question of possible CHF - although I don't have any documentation of cardiac dysfunction.  PLAN:  Admit for overnight observation.  Rule out myocardial injury with cardiac biomarkers and check a 2-D echocardiogram in the morning.  Will change Lasix to IV with Zaroxolyn premedication.  Bilateral leg Ace wraps with SCDs to assist with diuresis.  Blood pressure somewhat elevated, but he has not had his medications today. We will continue all home medications.  Marykay Lex, M.D., M.S. [facility] [address] [place], [place]  [address]  [phone] Pager # [phone]  06/07/2012 6:48 PM

## 2012-06-07 NOTE — ED Notes (Signed)
Advised the patient has bigeminal PVCs with a heart rate of 56.

## 2012-06-07 NOTE — ED Notes (Signed)
Pt presents with bilateral swelling and extremity pain X Saturday. Pt was picked up from pmd  Office. Sinus brady on monitor and 2L.

## 2012-06-08 LAB — CARDIAC PANEL(CRET KIN+CKTOT+MB+TROPI)
CK, MB: 3 ng/mL (ref 0.3–4.0)
Relative Index: INVALID (ref 0.0–2.5)
Total CK: 99 U/L (ref 7–232)

## 2012-06-08 LAB — BASIC METABOLIC PANEL
CO2: 27 mEq/L (ref 19–32)
Chloride: 93 mEq/L — ABNORMAL LOW (ref 96–112)
Creatinine, Ser: 1.08 mg/dL (ref 0.50–1.35)
GFR calc Af Amer: 68 mL/min — ABNORMAL LOW (ref >90)
Sodium: 132 mEq/L — ABNORMAL LOW (ref 135–145)

## 2012-06-08 LAB — TSH: TSH: 0.97 u[IU]/mL (ref 0.350–4.500)

## 2012-06-08 MED ORDER — FUROSEMIDE 40 MG PO TABS
40.0000 mg | ORAL_TABLET | Freq: Two times a day (BID) | ORAL | Status: DC
  Administered 2012-06-08 – 2012-06-09 (×2): 40 mg via ORAL
  Filled 2012-06-08 (×4): qty 1

## 2012-06-08 NOTE — Progress Notes (Signed)
[facility]  Subjective: Right arm pain resolved.  LEE improved.  Breathing better.  Objective: Vital signs in last 24 hours: Temp:  [97.3 F (36.3 C)-98.4 F (36.9 C)] 98.3 F (36.8 C) (06/27 0831) Pulse Rate:  [33-83] 55  (06/27 0831) Resp:  [16-21] 19  (06/27 0831) BP: (129-172)/(48-95) 145/63 mmHg (06/27 0831) SpO2:  [96 %-100 %] 100 % (06/27 0831) Weight:  [99.7 kg (219 lb 12.8 oz)-106.595 kg (235 lb)] 99.7 kg (219 lb 12.8 oz) (06/27 0500)    Intake/Output from previous day: 06/26 0701 - 06/27 0700 In: -  Out: 1275 [Urine:1275] Intake/Output this shift: Total I/O In: 240 [P.O.:240] Out: -   Medications Current Facility-Administered Medications  Medication Dose Route Frequency Provider Last Rate Last Dose  . 0.9 %  sodium chloride infusion  250 mL Intravenous PRN Nada Boozer, NP      . acetaminophen (TYLENOL) tablet 650 mg  650 mg Oral Q4H PRN Nada Boozer, NP      . amLODipine (NORVASC) tablet 5 mg  5 mg Oral Daily Nada Boozer, NP      . aspirin EC tablet 81 mg  81 mg Oral Daily Nada Boozer, NP      . cloNIDine (CATAPRES) tablet 0.1 mg  0.1 mg Oral Daily Nada Boozer, NP   0.1 mg at 06/07/12 2046  . docusate sodium (COLACE) capsule 100 mg  100 mg Oral BID Nada Boozer, NP   100 mg at 06/07/12 2329  . furosemide (LASIX) injection 40 mg  40 mg Intravenous Q12H Nada Boozer, NP   40 mg at 06/07/12 2048  . heparin injection 5,000 Units  5,000 Units Subcutaneous Q8H Nada Boozer, NP   5,000 Units at 06/08/12 [phone]  . isosorbide mononitrate (IMDUR) 24 hr tablet 120 mg  120 mg Oral Daily Nada Boozer, NP      . lisinopril (PRINIVIL,ZESTRIL) tablet 40 mg  40 mg Oral QHS Marykay Lex, MD      . metolazone (ZAROXOLYN) tablet 2.5 mg  2.5 mg Oral Daily Nada Boozer, NP   2.5 mg at 06/07/12 2330  . metoprolol succinate (TOPROL-XL) 24 hr tablet 100 mg  100 mg Oral Daily Nada Boozer, NP      . ondansetron [facility]) injection 4 mg  4 mg Intravenous  Q6H PRN Nada Boozer, NP      . pantoprazole (PROTONIX) EC tablet 40 mg  40 mg Oral Daily Nada Boozer, NP   40 mg at 06/07/12 2046  . polyethylene glycol (MIRALAX / GLYCOLAX) packet 17 g  17 g Oral Daily Nada Boozer, NP      . potassium chloride SA (K-DUR,KLOR-CON) CR tablet 40 mEq  40 mEq Oral Daily Nada Boozer, NP   40 mEq at 06/07/12 2046  . sodium chloride 0.9 % injection 3 mL  3 mL Intravenous Q12H Nada Boozer, NP   3 mL at 06/07/12 2230  . sodium chloride 0.9 % injection 3 mL  3 mL Intravenous PRN Nada Boozer, NP      . DISCONTD: quinapril (ACCUPRIL) tablet 40 mg  40 mg Oral QHS Nada Boozer, NP        PE: General appearance: alert/oriented, cooperative and no distress. Sitting on the edge of the bed. Neck: no JVD Lungs: clear to auscultation bilaterally Heart: regular rate and rhythm, S1, S2 normal, no murmur, click, rub or gallop Abdomen: +BS,  nontender,  Distended. Extremities: 2+ LEE Pulses: Radials 2+ and symmetric Right PT 2+.  Left DP 2+. Skin: warm and dry.  Lab Results:   [facility] 06/07/12 2009 06/07/12 1352  WBC 4.6 3.6*  HGB 15.8 14.4  HCT 44.5 39.9  PLT 228 198   BMET  Basename 06/08/12 0317 06/07/12 2009 06/07/12 1352  NA 132* -- 131*  K 3.7 -- 3.9  CL 93* -- 90*  CO2 27 -- 29  GLUCOSE 129* -- 89  BUN 18 -- 20  CREATININE 1.08 1.08 1.22  CALCIUM 9.8 -- 9.6   Lipid Panel  No results found for this basename: chol, trig, hdl, cholhdl, vldl, ldlcalc   Cardiac Panel (last 3 results)  Basename 06/08/12 0316 06/07/12 2002 06/07/12 1353  CKTOTAL 89 92 --  CKMB 2.8 3.0 --  TROPONINI <0.30 <0.30 <0.30  RELINDX RELATIVE INDEX IS INVALID RELATIVE INDEX IS INVALID --    Studies/Results: @RISRSLT2 @   Assessment/Plan  Principal Problem:  *Edema of both legs -- Acute on chronic worsening Active Problems:  Chronic edema  HTN (hypertension), benign  Arm pain, right  Exertional shortness of breath  Osteoarthritis (arthritis due to wear and tear of  joints)   Plan:  - of urine yesterday.  No PO recorded.  BNP 1327 on admission.  Lasix 40mg  IV bid. Metolazone/ ASA/clonidine/amlodipine/imdur 120mg /lisinopril/Toprolol XL.  2D echo pending.  He has chronic edema.  One more day of IV lasix then switch to PO.  Monitor SCr./K+  Cardiac markers are negative.  A lot of PVCs on tele.Raynelle Fanning.  AM BNP.   LOS: 1 day    HAGER, BRYAN 06/08/2012 9:37 AM   Agree with note written by [facility]  Admitted with CHF. Diuresed. Appears euvolemic. Lungs clear. No periph edema. Transition to PO diuretics. Home AM. 2D pending.   Runell Gess 06/08/2012 1:50 PM

## 2012-06-08 NOTE — Progress Notes (Signed)
Utilization review completed.  

## 2012-06-09 DIAGNOSIS — R600 Localized edema: Secondary | ICD-10-CM

## 2012-06-09 HISTORY — DX: Localized edema: R60.0

## 2012-06-09 LAB — BASIC METABOLIC PANEL
CO2: 28 mEq/L (ref 19–32)
Calcium: 9.7 mg/dL (ref 8.4–10.5)
Creatinine, Ser: 1.26 mg/dL (ref 0.50–1.35)
Glucose, Bld: 84 mg/dL (ref 70–99)

## 2012-06-09 MED ORDER — FUROSEMIDE 40 MG PO TABS
40.0000 mg | ORAL_TABLET | Freq: Every day | ORAL | Status: DC
  Administered 2012-06-10: 40 mg via ORAL
  Filled 2012-06-09: qty 1

## 2012-06-09 NOTE — Progress Notes (Signed)
Pt. Not discharged today secondary to hypotension, pm lasix D/C'd.  If stable in am will be d/c'd needs home health PT and 3 in one [facility].

## 2012-06-09 NOTE — Progress Notes (Signed)
Pt. Bp100/50, P54, L Ingold PA notified and instructed to recheck BP in an hour.  Pt is asymptomatic.  Will cont. To monitor.  Amanda Pea, Charity fundraiser.

## 2012-06-09 NOTE — Progress Notes (Signed)
I discussed this plan with Ms. Ingold.   He needs Home Health set up prior to discharge & BP was a bit low.  Will need to adjust his OP regimen.  Marykay Lex, M.D., M.S. [facility] [address] [place], [place]  [address]  [phone] Pager # [phone]  06/09/2012 9:28 PM

## 2012-06-09 NOTE — Progress Notes (Signed)
  Echocardiogram 2D Echocardiogram has been performed.  Joshua Norton Joshua Norton 06/09/2012, 12:36 PM

## 2012-06-09 NOTE — Progress Notes (Signed)
Patient has accepted [facility] Care Management services for new CHF diagnosis management.  Patient indicated that he needs a scale for weight monitoring.  We can provide a scale upon our initial home assessment.  For any additional questions or new referrals please contact Anibal Henderson BSN RN [facility]ia[facility] at [phone].

## 2012-06-09 NOTE — Evaluation (Signed)
Physical Therapy Evaluation Patient Details Name: Joshua Norton MRN: [id number] DOB: 05-22-1919 Today's Date: 06/09/2012 Time: 7829-5621 PT Time Calculation (min): 17 min  PT Assessment / Plan / Recommendation Clinical Impression  Pt is a 78 y/o male who lives alone and has a grandson who takes care of him intermittently.  Pt demonstrates difficulty with mobility which he attributes to prolonged bed rest.  Pt will benefit from HHPT follow-up and 3 in 1 .     PT Assessment  Patient needs continued PT services    Follow Up Recommendations  Home health PT;Supervision - Intermittent    Barriers to Discharge Decreased caregiver support      Equipment Recommendations  3 in 1 bedside comode    Recommendations for Other Services     Frequency Min 3X/week    Precautions / Restrictions Precautions Precautions: Fall Restrictions Weight Bearing Restrictions: No   Pertinent Vitals/Pain No c/o pain.      Mobility  Bed Mobility Bed Mobility: Supine to Sit Supine to Sit: 5: Supervision Details for Bed Mobility Assistance: supervision for safety as this was first time working with pt.   Transfers Transfers: Sit to Stand;Stand to Sit Sit to Stand: 5: Supervision;With upper extremity assist;From bed Stand to Sit: 4: Min guard;With upper extremity assist;To chair/3-in-1 Details for Transfer Assistance: Cues for hand placement and safet technique.   Ambulation/Gait Ambulation/Gait Assistance: 4: Min guard Ambulation Distance (Feet): 100 Feet Assistive device: Rolling walker Ambulation/Gait Assistance Details: Cues for proper use of RW, difficulty with change in direction, Cues for upright trunk posture.  Gait Pattern: Step-to pattern;Decreased stride length;Narrow base of support;Shuffle;Trendelenburg Stairs: No Wheelchair Mobility Wheelchair Mobility: No    Exercises     PT Diagnosis: Difficulty walking;Generalized weakness;Abnormality of gait  PT Problem List: Decreased  strength;Decreased activity tolerance;Decreased balance;Decreased mobility;Decreased coordination PT Treatment Interventions: Gait training;DME instruction;Functional mobility training;Therapeutic activities;Therapeutic exercise;Balance training   PT Goals Acute Rehab PT Goals PT Goal Formulation: With patient Time For Goal Achievement: 06/16/12 Potential to Achieve Goals: Good Pt will go Sit to Stand: with modified independence PT Goal: Sit to Stand - Progress: Goal set today Pt will go Stand to Sit: with modified independence;with upper extremity assist PT Goal: Stand to Sit - Progress: Goal set today Pt will Ambulate: >150 feet;with least restrictive assistive device;with supervision PT Goal: Ambulate - Progress: Goal set today Pt will Go Up / Down Stairs: 1-2 stairs;with least restrictive assistive device;with supervision PT Goal: Up/Down Stairs - Progress: Met  Visit Information  Last PT Received On: 06/09/12    Subjective Data  Subjective: I feel a little weak from lying around for a week   Prior Functioning  Home Living Lives With: Alone Available Help at Discharge: Family;Available PRN/intermittently (Grandson) Type of Home: House Home Access: Level entry Home Layout: One level Bathroom Shower/Tub: Tub/shower unit;Curtain Firefighter: Standard Bathroom Accessibility: Yes How Accessible: Accessible via walker Home Adaptive Equipment: Walker - rolling;Shower chair with back Prior Function Level of Independence: Independent Able to Take Stairs?: Yes Driving: No Vocation: Retired Musician: No difficulties    Cognition  Overall Cognitive Status: Appears within functional limits for tasks assessed/performed Arousal/Alertness: Awake/alert Orientation Level: Oriented X4 / Intact Behavior During Session: WFL for tasks performed    Extremity/Trunk Assessment Right Upper Extremity Assessment RUE ROM/Strength/Tone: Deficits Left Upper Extremity  Assessment LUE ROM/Strength/Tone: Within functional levels Right Lower Extremity Assessment RLE ROM/Strength/Tone: Deficits RLE ROM/Strength/Tone Deficits: pt presents with mild LE weaknes.  Left Lower Extremity Assessment LLE ROM/Strength/Tone:  Unable to fully assess Trunk Assessment Trunk Assessment: Normal   Balance    End of Session PT - End of Session Equipment Utilized During Treatment: Gait belt Activity Tolerance: Patient tolerated treatment well Patient left: in bed;with call bell/phone within reach;with family/visitor present;with chair alarm set Nurse Communication: Mobility status  GP     Kieli Golladay 06/09/2012, 2:38 PM [place]gr[place] L. Leonetta Mcgivern DPT [phone]

## 2012-06-09 NOTE — Progress Notes (Signed)
Recheck BP130/58, P50 pt asymptomatic. L. Ingold informed.  Instructed nurse that she will hold pt's d/c until tomorrow.  Will cont. To monitor.  Amanda Pea, RN

## 2012-06-09 NOTE — Progress Notes (Addendum)
[facility]  Subjective: Right arm pain resolved.  LEE improved.  Breathing better.  Objective: Vital signs in last 24 hours: Temp:  [97.3 F (36.3 C)-98 F (36.7 C)] 97.3 F (36.3 C) (06/28 0438) Pulse Rate:  [51-62] 55  (06/28 0438) Resp:  [18] 18  (06/28 0438) BP: (113-146)/(48-78) 113/48 mmHg (06/28 0438) SpO2:  [100 %] 100 % (06/28 0438) Weight:  [98.975 kg (218 lb 3.2 oz)] 98.975 kg (218 lb 3.2 oz) (06/28 0438) Last BM Date: 06/04/12 (claims regularly move his bowels almost every week)  Intake/Output from previous day: 06/27 0701 - 06/28 0700 In: 1233 [P.O.:1230; I.V.:3] Out: 1680 [Urine:1680] Intake/Output this shift: Total I/O In: 360 [P.O.:360] Out: 225 [Urine:225]  Medications Current Facility-Administered Medications  Medication Dose Route Frequency Provider Last Rate Last Dose  . 0.9 %  sodium chloride infusion  250 mL Intravenous PRN Nada Boozer, NP      . acetaminophen (TYLENOL) tablet 650 mg  650 mg Oral Q4H PRN Nada Boozer, NP      . amLODipine (NORVASC) tablet 5 mg  5 mg Oral Daily Nada Boozer, NP   5 mg at 06/08/12 1015  . aspirin EC tablet 81 mg  81 mg Oral Daily Nada Boozer, NP   81 mg at 06/08/12 1016  . cloNIDine (CATAPRES) tablet 0.1 mg  0.1 mg Oral Daily Nada Boozer, NP   0.1 mg at 06/08/12 1015  . docusate sodium (COLACE) capsule 100 mg  100 mg Oral BID Nada Boozer, NP   100 mg at 06/08/12 2242  . furosemide (LASIX) tablet 40 mg  40 mg Oral BID Wilburt Finlay, PA   40 mg at 06/08/12 1712  . heparin injection 5,000 Units  5,000 Units Subcutaneous Q8H Nada Boozer, NP   5,000 Units at 06/09/12 0559  . isosorbide mononitrate (IMDUR) 24 hr tablet 120 mg  120 mg Oral Daily Nada Boozer, NP   120 mg at 06/08/12 1015  . lisinopril (PRINIVIL,ZESTRIL) tablet 40 mg  40 mg Oral QHS Marykay Lex, MD   40 mg at 06/08/12 2146  . metolazone (ZAROXOLYN) tablet 2.5 mg  2.5 mg Oral Daily Nada Boozer, NP   2.5 mg at 06/08/12 1016    . metoprolol succinate (TOPROL-XL) 24 hr tablet 100 mg  100 mg Oral Daily Nada Boozer, NP   100 mg at 06/08/12 1016  . ondansetron (ZOFRAN) injection 4 mg  4 mg Intravenous Q6H PRN Nada Boozer, NP      . pantoprazole (PROTONIX) EC tablet 40 mg  40 mg Oral Daily Nada Boozer, NP   40 mg at 06/08/12 1016  . polyethylene glycol (MIRALAX / GLYCOLAX) packet 17 g  17 g Oral Daily Nada Boozer, NP   17 g at 06/08/12 1016  . potassium chloride SA (K-DUR,KLOR-CON) CR tablet 40 mEq  40 mEq Oral Daily Nada Boozer, NP   40 mEq at 06/08/12 1015  . sodium chloride 0.9 % injection 3 mL  3 mL Intravenous Q12H Nada Boozer, NP   3 mL at 06/08/12 2146  . sodium chloride 0.9 % injection 3 mL  3 mL Intravenous PRN Nada Boozer, NP      . DISCONTD: furosemide (LASIX) injection 40 mg  40 mg Intravenous Q12H Nada Boozer, NP   40 mg at 06/08/12 1056   PE: General appearance: alert/oriented, cooperative and no distress. Sitting on the edge of the bed. Neck: no JVD Lungs: clear to auscultation bilaterally Heart: regular rate and  rhythm, S1, S2 normal, no murmur, click, rub or gallop Abdomen: +BS,  nontender,  Distended. Extremities: 2+ LEE Pulses: Radials 2+ and symmetric Right PT 2+.  Left DP 2+. Skin: warm and dry.  Lab Results:   [facility] 06/07/12 2009 06/07/12 1352  WBC 4.6 3.6*  HGB 15.8 14.4  HCT 44.5 39.9  PLT 228 198   BMET  Basename 06/09/12 0538 06/08/12 0317 06/07/12 2009 06/07/12 1352  NA 129* 132* -- 131*  K 4.1 3.7 -- 3.9  CL 90* 93* -- 90*  CO2 28 27 -- 29  GLUCOSE 84 129* -- 89  BUN 20 18 -- 20  CREATININE 1.26 1.08 1.08 --  CALCIUM 9.7 9.8 -- 9.6   Cardiac Panel (last 3 results)  Basename 06/08/12 1323 06/08/12 0316 06/07/12 2002  CKTOTAL 99 89 92  CKMB 3.0 2.8 3.0  TROPONINI <0.30 <0.30 <0.30  RELINDX RELATIVE INDEX IS INVALID RELATIVE INDEX IS INVALID RELATIVE INDEX IS INVALID   Studies/Results: Na - 129, Cl 90 with Cr up to 1.26  Assessment/Plan  Principal  Problem:  *Edema of both legs -- Acute on chronic worsening Active Problems:  Exertional shortness of breath  Chronic edema  HTN (hypertension), benign  Arm pain, right  Osteoarthritis (arthritis due to wear and tear of joints)   Presumed CHF exacerbation on admission - but unclear if Systolic or Diastolic as Echo yet to be performed.   Diuresed well since admission. With Na & Cl decreasing with Cr increasing, will change back to PO Lasix today. 2D echo pending - will need to see these results before d/c to ensure appropriate d/c therapy.  He has chronic edema.  & PVC are also chronic.  Baseline weakness & lives alone.  Does have grandchildren who help out, but are not around all day. Will c/s PT/OT & SW - ? If he could get help with Home Health PT/OT -- they could help place compression stockings on during the day.   LOS: 2 days   Avryl Roehm W 06/09/2012 9:04 AM

## 2012-06-10 LAB — BASIC METABOLIC PANEL
BUN: 24 mg/dL — ABNORMAL HIGH (ref 6–23)
CO2: 27 mEq/L (ref 19–32)
Chloride: 93 mEq/L — ABNORMAL LOW (ref 96–112)
Creatinine, Ser: 1.45 mg/dL — ABNORMAL HIGH (ref 0.50–1.35)
Glucose, Bld: 93 mg/dL (ref 70–99)

## 2012-06-10 MED ORDER — ASPIRIN 81 MG PO TBEC: 81.0000 mg | DELAYED_RELEASE_TABLET | Freq: Every day | ORAL | Status: AC

## 2012-06-10 MED ORDER — FUROSEMIDE 40 MG PO TABS: 40.0000 mg | ORAL_TABLET | Freq: Two times a day (BID) | ORAL | Status: DC

## 2012-06-10 MED ORDER — FUROSEMIDE 40 MG PO TABS: 40.0000 mg | ORAL_TABLET | Freq: Every day | ORAL | Status: DC

## 2012-06-10 NOTE — Progress Notes (Signed)
Cm spoke with pt concerning dc planning. Per pt choice [facility] to provide Hh services upon discharge. [facility] notified of new referral. H/P, demographics, MD orders faxed to [facility] at [phone]. Pt lives alone, grandson drops by to assist in home care. Patient has access to [facility]. No other needs requested.    Leonie Green [phone]

## 2012-06-10 NOTE — Progress Notes (Signed)
[facility]  Subjective: Felt a little SOB today but no orthopnea.  Objective: Vital signs in last 24 hours: Temp:  [96.8 F (36 C)-98.7 F (37.1 C)] 98.7 F (37.1 C) (06/29 0539) Pulse Rate:  [47-60] 60  (06/29 0539) Resp:  [18-20] 20  (06/29 0539) BP: (100-149)/(50-64) 149/64 mmHg (06/29 0539) SpO2:  [97 %-99 %] 99 % (06/29 0539) Weight:  [99.4 kg (219 lb 2.2 oz)] 99.4 kg (219 lb 2.2 oz) (06/29 0539) Last BM Date: 06/04/12  Intake/Output from previous day: 06/28 0701 - 06/29 0700 In: 1200 [P.O.:1200] Out: 1475 [Urine:1475] Intake/Output this shift: Total I/O In: 240 [P.O.:240] Out: -   Medications Current Facility-Administered Medications  Medication Dose Route Frequency Provider Last Rate Last Dose  . 0.9 %  sodium chloride infusion  250 mL Intravenous PRN Nada Boozer, NP      . acetaminophen (TYLENOL) tablet 650 mg  650 mg Oral Q4H PRN Nada Boozer, NP      . amLODipine (NORVASC) tablet 5 mg  5 mg Oral Daily Nada Boozer, NP   5 mg at 06/10/12 0956  . aspirin EC tablet 81 mg  81 mg Oral Daily Nada Boozer, NP   81 mg at 06/10/12 0956  . cloNIDine (CATAPRES) tablet 0.1 mg  0.1 mg Oral Daily Nada Boozer, NP   0.1 mg at 06/10/12 0956  . docusate sodium (COLACE) capsule 100 mg  100 mg Oral BID Nada Boozer, NP   100 mg at 06/10/12 0956  . furosemide (LASIX) tablet 40 mg  40 mg Oral Daily Nada Boozer, NP   40 mg at 06/10/12 0956  . heparin injection 5,000 Units  5,000 Units Subcutaneous Q8H Nada Boozer, NP   5,000 Units at 06/10/12 [phone]  . isosorbide mononitrate (IMDUR) 24 hr tablet 120 mg  120 mg Oral Daily Nada Boozer, NP   120 mg at 06/10/12 0956  . lisinopril (PRINIVIL,ZESTRIL) tablet 40 mg  40 mg Oral QHS Marykay Lex, MD   40 mg at 06/09/12 2225  . metolazone (ZAROXOLYN) tablet 2.5 mg  2.5 mg Oral Daily Nada Boozer, NP   2.5 mg at 06/10/12 0955  . metoprolol succinate (TOPROL-XL) 24 hr tablet 100 mg  100 mg Oral Daily Nada Boozer, NP   100 mg at 06/09/12 1044  . ondansetron (ZOFRAN) injection 4 mg  4 mg Intravenous Q6H PRN Nada Boozer, NP      . pantoprazole (PROTONIX) EC tablet 40 mg  40 mg Oral Daily Nada Boozer, NP   40 mg at 06/09/12 1248  . polyethylene glycol (MIRALAX / GLYCOLAX) packet 17 g  17 g Oral Daily Nada Boozer, NP   17 g at 06/09/12 1045  . potassium chloride SA (K-DUR,KLOR-CON) CR tablet 40 mEq  40 mEq Oral Daily Nada Boozer, NP   40 mEq at 06/10/12 0955  . sodium chloride 0.9 % injection 3 mL  3 mL Intravenous Q12H Nada Boozer, NP   3 mL at 06/10/12 0958  . sodium chloride 0.9 % injection 3 mL  3 mL Intravenous PRN Nada Boozer, NP      . DISCONTD: furosemide (LASIX) tablet 40 mg  40 mg Oral BID Wilburt Finlay, PA   40 mg at 06/09/12 1914    PE: General appearance: alert, cooperative and no distress Lungs: clear to auscultation bilaterally Heart: regular rate and rhythm, S1, S2 normal, no murmur, click, rub or gallop Abdomen: +BS, No distension or pain. Extremities: 1-2+ LEE Pulses:  Radials 2+ and symmetric Neurologic: Grossly normal  Lab Results:   Basename 06/07/12 2009 06/07/12 1352  WBC 4.6 3.6*  HGB 15.8 14.4  HCT 44.5 39.9  PLT 228 198   BMET  Basename 06/10/12 0526 06/09/12 0538 06/08/12 0317  NA 133* 129* 132*  K 4.7 4.1 3.7  CL 93* 90* 93*  CO2 27 28 27   GLUCOSE 93 84 129*  BUN 24* 20 18  CREATININE 1.45* 1.26 1.08  CALCIUM 9.6 9.7 9.8    Assessment/Plan  Principal Problem:  *Edema of both legs -- Acute on chronic worsening Active Problems:  Chronic edema  HTN (hypertension), benign  Arm pain, right  Exertional shortness of breath  Osteoarthritis (arthritis due to wear and tear of joints)   Plan:  Improved BP.  Improved Na/Cl.  Worsening SCr.  - diuresed in the last 48 hrs.  -75ml in the last 24hrs.  DC home today with BMET as OP.  Extender followup next Wednesday.  Lasix 40mg  daily.  Home dosing was  BID.   LOS: 3 days    HAGER,  BRYAN 06/10/2012 11:52 AM  ATTENDING ATTESTATION:  I have seen and examined the patient along with Wilburt Finlay, PA.  I have reviewed the chart, notes and new data.  I agree with Bryan's note.  Mr. Hufstetler is doing well today.  Sx are stable.  His Cr is just a bit elevated, so I agree with backing off of the lasix for today & perhaps tomorrow, to restart @ home dose by Monday.  I agree with OP BMP & close f/u as noted above.     He has HH set up & is ready for discharge.  Marykay Lex, M.D., M.S. [facility] [address] [place], [place]  [address]  [phone]  06/10/2012 3:17 PM

## 2012-06-10 NOTE — Progress Notes (Signed)
CSW received inappropriate referral for home health needs. CSW informed RN case manager regarding patient needs. .No further Clinical Social Work needs, signing off.   Catha Gosselin, LCSWA  Weekend coverage [phone] .06/10/2012 8:51am

## 2012-06-12 NOTE — Discharge Summary (Signed)
Physician Discharge Summary  Patient ID: Joshua Norton MRN: [id number] DOB/AGE: [id number] 76 y.o.  Admit date: 06/07/2012 Discharge date: 06/12/2012  Admission Diagnoses:    Discharge Diagnoses:  Principal Problem:  *Edema of both legs -- Acute on chronic worsening Active Problems:  Chronic edema  HTN (hypertension), benign  Arm pain, right  Exertional shortness of breath  Osteoarthritis (arthritis due to wear and tear of joints)    Discharged Condition: stable  Hospital Course:   This is a 78 y.o. male with a past medical history significant for hypertension, chronic edema and osteoarthritis who presented [place] emergency room due to right arm pain. He states that he started having this pain over the weekend. It is localized in the left elbow and radiated down toward the left hand it was a sharp pinching type pain.   In addition to this symptom he also noticed over the past weekend he started having worsening of his regular edema, it is actually improved now. He is noted no worsening shortness of breath than from his baseline but denies any PND or orthopnea. He denied any chest pressure or pain with rest or exertion he does note shortness of breath at rest and with exertion. Although he has PVCs on his EKG he denied any palpitations. He has not complained of any symptoms of syncope/near-syncope, or TIA or CVA, but he has noted some intermittent lightheadedness. He also notes intermittent nausea, and chronic constipation same he has maybe one bowel movement a week. He denied any melena, hematochezia, hematuria or dysuria.  He was admitted and started on IV lasix along with matolazone.  Bilateral Ace wraps with SCDs were applied.  We checked a 2D echo which revealed an EF of 40-45%, peak PA pressure .  Right arm pain resolved.  He continued to diurese well.  Lasix was changed to PO.  The patient became hypotensive so evening dose of lasix was held.  He was discharged the following day  in stable condition with improved BP after being seen by Dr. Herbie Baltimore.  HH was arranged.  The patient will get a BMET as an OP during his followup appt.   Outpatient consideration:  BMET(He rides the [employer] shuttle so i did not get the BMET prior to visit.)  Consults: Case manager  Significant Diagnostic Studies:   06/09/12, 2D Echo, Study Conclusions  - Left ventricle: The cavity size was moderately dilated. Wall thickness was normal. RWT is 0.33 g/m2, consistent with eccentric hypertrophy. Total LV mass is 251g. This may represent hypertensive cardiomyopathy. Systolic function was mildly to moderately reduced. The estimated ejection fraction was in the range of 40% to 45%, with beat to beat variation and significant ventricular ectopy. There is incoordinate septal motion and moderate global hypokinesis. An LV apical false tendon (normal variant) is seen. - Aortic valve: Sclerosis without stenosis. Trace to mild central regurgitation. - Mitral valve: Mildly thickened leaflets . Mild late systolic prolapse of both leaflets. Trivial MR. - Right ventricle: The cavity size was normal. Wall thickness was normal. The moderator band was prominent. Systolic function was normal. - Right atrium: The atrium was mildly dilated. - Tricuspid valve: Trivial regurgitation. - Pulmonary arteries: PA peak pressure: 32mm Hg (S). - Systemic veins: The IVC measures <1.2 cm and collapses spontaneously, suggesting dehydration and a low RA pressure of 2 mmHg.  PORTABLE CHEST - 1 VIEW  Comparison: Chest x-ray 08/13/2011.  Findings: Lung volumes are normal. There are some small nodular  densities seen in the right  lung, most pronounced at the right  base, favored to represent calcified granulomas (similar to prior  study from 08/13/2011). No other larger more suspicious appearing  pulmonary nodules or masses are otherwise identified. No acute  consolidative airspace disease. Linear opacity in the right  base  is favored to reflect subsegmental atelectasis. No definite  pleural effusions (right costophrenic sulcus is excluded from the  margin of the image). Pulmonary venous congestion without frank  pulmonary edema. Mild cardiomegaly is unchanged. The patient is  rotated to the left on today's exam, resulting in distortion of the  mediastinal contours and reduced diagnostic sensitivity and  specificity for mediastinal pathology. Atherosclerosis of the  thoracic aorta.  IMPRESSION:  1. Mild cardiomegaly with pulmonary venous congestion without  frank pulmonary edema.  2. Atherosclerosis.  3. Multiple small dense right-sided pulmonary nodules are favored  to represent calcified granulomas. These are similar to prior  study 08/13/2011.   Treatments: lasix, Matolazone   Discharge Exam: Blood pressure 130/56, pulse 82, temperature 97.4 F (36.3 C), temperature source Oral, resp. rate 20, height 5\' 7"  (1.702 m), weight 99.4 kg (219 lb 2.2 oz), SpO2 97.00%.   Disposition: 01-Home or Self Care  Discharge Orders    Future Orders Please Complete By Expires   Diet - low sodium heart healthy      Increase activity slowly        Medication List  As of 06/12/2012 11:03 AM   TAKE these medications         amLODipine 5 MG tablet   Commonly known as: NORVASC   Take 5 mg by mouth daily.      aspirin 81 MG EC tablet   Take 1 tablet (81 mg total) by mouth daily.      cloNIDine 0.1 MG tablet   Commonly known as: CATAPRES   Take 0.1 mg by mouth daily.      furosemide 40 MG tablet   Commonly known as: LASIX   Take 1 tablet (40 mg total) by mouth 2 (two) times daily.      indomethacin 50 MG capsule   Commonly known as: INDOCIN   Take 50 mg by mouth daily.      isosorbide mononitrate 120 MG 24 hr tablet   Commonly known as: IMDUR   Take 120 mg by mouth daily.      metolazone 2.5 MG tablet   Commonly known as: ZAROXOLYN   Take 2.5 mg by mouth daily as needed. For high blood pressure        metoprolol succinate 100 MG 24 hr tablet   Commonly known as: TOPROL-XL   Take 100 mg by mouth daily. Take with or immediately following a meal.      pantoprazole 40 MG tablet   Commonly known as: PROTONIX   Take 40 mg by mouth daily.      quinapril 40 MG tablet   Commonly known as: ACCUPRIL   Take 40 mg by mouth at bedtime.      VITAMIN B-12 PO   Take 1 tablet by mouth daily.      VITAMIN D (ERGOCALCIFEROL) PO   Take 1 tablet by mouth daily.           Follow-up Information    Follow up with Abelino Derrick, PA on 06/14/2012. (at 3:00 pm)    Contact information:   [address] [place] [address] [phone]          Signed: Wilburt Finlay  06/12/2012, 11:03 AM  ATTENDING ATTESTATION:  I saw and examined the patient along with Wilburt Finlay, PA on the day of discharge. I have reviewed the chart, notes and new data. I agree with Bryan's discharge summary note above.  Mr. Fetch is doing well today. Sx are stable. His Cr is just a bit elevated, so I agree with backing off of the lasix for today & perhaps tomorrow, to restart @ home dose by Monday.  I agree with OP BMP & close f/u as noted above.  He has HH set up & is ready for discharge   Varnell Donate W, M.D., M.S. [facility] [address] [place], [place]  [address]  [phone] Pager # [phone]  06/12/2012 7:01 PM

## 2013-04-23 ENCOUNTER — Encounter: Payer: Self-pay | Admitting: Cardiovascular Disease

## 2013-04-24 ENCOUNTER — Ambulatory Visit (INDEPENDENT_AMBULATORY_CARE_PROVIDER_SITE_OTHER): Payer: Medicare Other | Admitting: Cardiology

## 2013-04-24 ENCOUNTER — Encounter: Payer: Self-pay | Admitting: Cardiology

## 2013-04-24 VITALS — BP 140/60 | HR 55 | Ht 66.0 in | Wt 220.6 lb

## 2013-04-24 DIAGNOSIS — I493 Ventricular premature depolarization: Secondary | ICD-10-CM

## 2013-04-24 DIAGNOSIS — I10 Essential (primary) hypertension: Secondary | ICD-10-CM

## 2013-04-24 DIAGNOSIS — R609 Edema, unspecified: Secondary | ICD-10-CM

## 2013-04-24 DIAGNOSIS — I4949 Other premature depolarization: Secondary | ICD-10-CM

## 2013-04-24 DIAGNOSIS — I422 Other hypertrophic cardiomyopathy: Secondary | ICD-10-CM

## 2013-04-24 DIAGNOSIS — I420 Dilated cardiomyopathy: Secondary | ICD-10-CM | POA: Insufficient documentation

## 2013-04-24 LAB — BASIC METABOLIC PANEL
BUN: 17 mg/dL (ref 6–23)
CO2: 26 mEq/L (ref 19–32)
Calcium: 9.7 mg/dL (ref 8.4–10.5)
Chloride: 93 mEq/L — ABNORMAL LOW (ref 96–112)
Creat: 1.13 mg/dL (ref 0.50–1.35)
Glucose, Bld: 89 mg/dL (ref 70–99)
Potassium: 4.9 mEq/L (ref 3.5–5.3)
Sodium: 132 mEq/L — ABNORMAL LOW (ref 135–145)

## 2013-04-24 MED ORDER — METOPROLOL SUCCINATE ER 50 MG PO TB24: 50.0000 mg | ORAL_TABLET | Freq: Every day | ORAL | Status: DC

## 2013-04-24 NOTE — Assessment & Plan Note (Signed)
Compensated. Remote negative Negative nuclear study

## 2013-04-24 NOTE — Assessment & Plan Note (Signed)
This appears to be unchanged. It does  Not appear to be bothersome to the pt

## 2013-04-24 NOTE — Assessment & Plan Note (Signed)
He is in bigeminy today

## 2013-04-24 NOTE — Progress Notes (Signed)
04/24/2013 Joshua Norton   09-21-1919  [id number]  Primary Physicia MCKEOWN,WILLIAM DAVID, MD Primary Cardiologist: Dr Allyson Sabal  HPI:  79 y/o with a history of chronic edema and HTN. He lives alone but his grandson checks on him. He does not drive, he took the [employer] bus here today. Vernona Rieger saw him last week and put him on Zaroxolyn prn for edema, the pt takes it when he feels he is not urinating enough. He denies any unusual SOB or orthopnea. His LE edema is unchanged and doesn't seem to bother him much. He did not bring his medications so we will call his pharmacy as there are some discrepancies. He does see Dr Oneta Rack regularly.   Current Outpatient Prescriptions  Medication Sig Dispense Refill  . amLODipine (NORVASC) 5 MG tablet Take 5 mg by mouth daily.      Marland Kitchen aspirin EC 81 MG EC tablet Take 1 tablet (81 mg total) by mouth daily.      . AZOPT 1 % ophthalmic suspension Place 1 drop into the right eye daily.      . cloNIDine (CATAPRES) 0.1 MG tablet Take 0.1 mg by mouth daily.      . Cyanocobalamin (VITAMIN B-12 PO) Take 1 tablet by mouth daily.      . furosemide (LASIX) 40 MG tablet Take 1 tablet (40 mg total) by mouth 2 (two) times daily.  30 tablet  5  . indomethacin (INDOCIN) 50 MG capsule Take 50 mg by mouth daily.      . isosorbide mononitrate (IMDUR) 120 MG 24 hr tablet Take 120 mg by mouth daily.      . meloxicam (MOBIC) 15 MG tablet Take 1 tablet by mouth daily.      . metolazone (ZAROXOLYN) 2.5 MG tablet Take 2.5 mg by mouth daily as needed. For high blood pressure      . metoprolol succinate (TOPROL-XL) 100 MG 24 hr tablet Take 100 mg by mouth daily. Take with or immediately following a meal.      . pantoprazole (PROTONIX) 40 MG tablet Take 40 mg by mouth daily.      . quinapril (ACCUPRIL) 40 MG tablet Take 40 mg by mouth at bedtime.      Marland Kitchen VITAMIN D, ERGOCALCIFEROL, PO Take 1 tablet by mouth daily.       No current facility-administered medications for this visit.    No Known  Allergies  History   Social History  . Marital Status: Widowed    Spouse Name: N/A    Number of Children: N/A  . Years of Education: N/A   Occupational History  . Not on file.   Social History Main Topics  . Smoking status: Never Smoker   . Smokeless tobacco: Never Used  . Alcohol Use: No  . Drug Use: Not on file  . Sexually Active: Not on file   Other Topics Concern  . Not on file   Social History Narrative  . No narrative on file     Review of Systems: General: negative for chills, fever, night sweats or weight changes.  Cardiovascular: negative for chest pain, dyspnea on exertion, edema, orthopnea, palpitations, paroxysmal nocturnal dyspnea or shortness of breath Dermatological: negative for rash Respiratory: negative for cough or wheezing Urologic: negative for hematuria Abdominal: negative for nausea, vomiting, diarrhea, bright red blood per rectum, melena, or hematemesis Neurologic: negative for visual changes, syncope, or dizziness All other systems reviewed and are otherwise negative except as noted above.    Blood pressure  140/60, pulse 55, height 5\' 6"  (1.676 m), weight 220 lb 9.6 oz (100.064 kg).  General appearance: alert, cooperative, appears stated age and no distress Lungs: significant scoliosis. crackles Rt base Heart: regular rate and rhythm and 2/6 systolic murmur Extremities: 2-3 chronic pitting edema to his knees. No ulcers noted  EKG  EKG: unchanged from previous tracings. NSR SB PVCs. In Bigeminy, bradycardic  ASSESSMENT AND PLAN:   Chronic edema This appears to be unchanged. It does  Not appear to be bothersome to the pt  HTN (hypertension), benign Controlled, on multiple medications  Congestive dilated cardiomyopathy- EF 40-45% 2D 6/13 Compensated. Remote negative Negative nuclear study  PVC's (premature ventricular contractions) He is in bigeminy today     PLAN  His edema appears to be chronic and doesn't seem to bother him. He  is bradycardic and has frequent PVCs that may be secondary to this. I will check BMP today, he is on ACE and his last K+ was 5.4. I'm also going to decrease Toprol to 50mg . Follow up in 3 mos.   Leoni Goodness KPA-C 04/24/2013 11:15 AM

## 2013-04-24 NOTE — Patient Instructions (Addendum)
Your physician recommends that you schedule a follow-up appointment in: 3 mos Dr Diona Fanti Your physician recommends that you return for lab work in: [facility] drawn today

## 2013-04-24 NOTE — Assessment & Plan Note (Signed)
Controlled, on multiple medications

## 2013-04-26 ENCOUNTER — Telehealth: Payer: Self-pay | Admitting: *Deleted

## 2013-04-27 NOTE — Telephone Encounter (Signed)
Patient informed on yesterday.

## 2013-08-23 ENCOUNTER — Ambulatory Visit (INDEPENDENT_AMBULATORY_CARE_PROVIDER_SITE_OTHER): Payer: Medicare Other | Admitting: Cardiology

## 2013-08-23 ENCOUNTER — Encounter: Payer: Self-pay | Admitting: Cardiology

## 2013-08-23 VITALS — BP 100/60 | HR 56 | Ht 66.0 in | Wt 223.0 lb

## 2013-08-23 DIAGNOSIS — I10 Essential (primary) hypertension: Secondary | ICD-10-CM

## 2013-08-23 DIAGNOSIS — R609 Edema, unspecified: Secondary | ICD-10-CM

## 2013-08-23 DIAGNOSIS — I420 Dilated cardiomyopathy: Secondary | ICD-10-CM

## 2013-08-23 DIAGNOSIS — I498 Other specified cardiac arrhythmias: Secondary | ICD-10-CM

## 2013-08-23 DIAGNOSIS — M79601 Pain in right arm: Secondary | ICD-10-CM

## 2013-08-23 DIAGNOSIS — M79609 Pain in unspecified limb: Secondary | ICD-10-CM

## 2013-08-23 DIAGNOSIS — R001 Bradycardia, unspecified: Secondary | ICD-10-CM | POA: Insufficient documentation

## 2013-08-23 DIAGNOSIS — I428 Other cardiomyopathies: Secondary | ICD-10-CM

## 2013-08-23 DIAGNOSIS — R600 Localized edema: Secondary | ICD-10-CM

## 2013-08-23 MED ORDER — METOPROLOL SUCCINATE ER 50 MG PO TB24: 50.0000 mg | ORAL_TABLET | Freq: Every day | ORAL | Status: DC

## 2013-08-23 NOTE — Assessment & Plan Note (Signed)
No change 

## 2013-08-23 NOTE — Patient Instructions (Signed)
Avoid salt. Decrease Toprol to 50 mg daily. See Dr Allyson Sabal in 6 months.

## 2013-08-23 NOTE — Assessment & Plan Note (Signed)
HR in low 50s- he denies syncope or pre syncope

## 2013-08-23 NOTE — Assessment & Plan Note (Deleted)
No CHF symptoms 

## 2013-08-23 NOTE — Progress Notes (Signed)
08/23/2013 Joshua Norton   06/12/1919  [id number]  Primary Physicia MCKEOWN,WILLIAM DAVID, MD Primary Cardiologist: Dr Allyson Sabal  HPI:  79 y/o with a history of chronic edema and HTN. He lives alone but his 41 y/o grandson checks on him. He does not drive, he took the [employer] bus here today.He denies any unusual SOB or orthopnea. His LE edema is unchanged and doesn't seem to bother him much. He denies any syncope or pre syncope.  He does see Dr Oneta Rack regularly. He says he tries to avoid salt but admits he is not always compliant. His HR is in the low 50s, he is on Toprol 100 mg daily.     Current Outpatient Prescriptions  Medication Sig Dispense Refill  . amLODipine (NORVASC) 5 MG tablet Take 5 mg by mouth daily. Patient takes 1/2 tablet      . AZOPT 1 % ophthalmic suspension Place 1 drop into the right eye daily.      . cloNIDine (CATAPRES) 0.1 MG tablet Take 0.1 mg by mouth daily.      . Cyanocobalamin (VITAMIN B-12 PO) Take 1 tablet by mouth daily.      . fexofenadine (ALLEGRA) 180 MG tablet Take 180 mg by mouth daily.      . furosemide (LASIX) 40 MG tablet Take 1 tablet (40 mg total) by mouth 2 (two) times daily.  30 tablet  5  . isosorbide mononitrate (IMDUR) 120 MG 24 hr tablet Take 120 mg by mouth daily.      Marland Kitchen LUMIGAN 0.01 % SOLN       . meloxicam (MOBIC) 15 MG tablet Take 1 tablet by mouth daily.      . metolazone (ZAROXOLYN) 2.5 MG tablet Take 2.5 mg by mouth daily as needed. For high blood pressure      . pantoprazole (PROTONIX) 40 MG tablet Take 40 mg by mouth daily.      . quinapril (ACCUPRIL) 40 MG tablet Take 40 mg by mouth at bedtime.      Marland Kitchen VITAMIN D, ERGOCALCIFEROL, PO Take 1 tablet by mouth daily.       No current facility-administered medications for this visit.    No Known Allergies  History   Social History  . Marital Status: Widowed    Spouse Name: N/A    Number of Children: N/A  . Years of Education: N/A   Occupational History  . Not on file.   Social  History Main Topics  . Smoking status: Never Smoker   . Smokeless tobacco: Never Used  . Alcohol Use: No  . Drug Use: Not on file  . Sexual Activity: Not on file   Other Topics Concern  . Not on file   Social History Narrative  . No narrative on file     Review of Systems: General: negative for chills, fever, night sweats or weight changes.  Cardiovascular: negative for chest pain, dyspnea on exertion, edema, orthopnea, palpitations, paroxysmal nocturnal dyspnea or shortness of breath Dermatological: negative for rash Respiratory: negative for cough or wheezing Urologic: negative for hematuria Abdominal: negative for nausea, vomiting, diarrhea, bright red blood per rectum, melena, or hematemesis Neurologic: negative for visual changes, syncope, or dizziness All other systems reviewed and are otherwise negative except as noted above.    Blood pressure 100/60, pulse 56, height 5\' 6"  (1.676 m), weight 223 lb (101.152 kg).  General appearance: alert, cooperative and no distress Lungs: clear to auscultation bilaterally Heart: regular rate and rhythm Extremities: 2+ chronic edema bilateraly  EKG NSR, SB, LAD, IVCD  ASSESSMENT AND PLAN:   Bradycardia HR in low 50s- he denies syncope or pre syncope  HTN (hypertension), benign Controlled  Edema of both legs - No change  Congestive dilated cardiomyopathy- EF 40-45% 2D 6/13 No CHF symptoms   PLAN  I cut Joshua Norton's Toprol back to 50 mg. We talked about the importance of a low salt diet. He can follow up with Dr Allyson Sabal in 6 months.  Joshua Norton KPA-C 08/23/2013 12:07 PM

## 2013-08-23 NOTE — Assessment & Plan Note (Signed)
Controlled.  

## 2013-08-23 NOTE — Assessment & Plan Note (Signed)
No CHF symptoms 

## 2013-09-18 ENCOUNTER — Other Ambulatory Visit: Payer: Self-pay

## 2013-09-18 MED ORDER — METOPROLOL SUCCINATE ER 50 MG PO TB24: 50.0000 mg | ORAL_TABLET | Freq: Every day | ORAL | Status: DC

## 2013-09-18 NOTE — Addendum Note (Signed)
Addended by: Neta Ehlers on: 09/18/2013 06:33 PM   Modules accepted: Orders

## 2013-09-18 NOTE — Telephone Encounter (Signed)
Rx was sent to pharmacy electronically. 

## 2013-10-03 ENCOUNTER — Other Ambulatory Visit (HOSPITAL_COMMUNITY): Payer: Self-pay | Admitting: Emergency Medicine

## 2013-10-03 ENCOUNTER — Ambulatory Visit (HOSPITAL_COMMUNITY)
Admission: RE | Admit: 2013-10-03 | Discharge: 2013-10-03 | Disposition: A | Discharge status: Home / Self Care | Payer: Medicare Other | Source: Ambulatory Visit | Attending: Emergency Medicine | Admitting: Emergency Medicine

## 2013-10-03 DIAGNOSIS — R059 Cough, unspecified: Secondary | ICD-10-CM | POA: Insufficient documentation

## 2013-10-03 DIAGNOSIS — R05 Cough: Secondary | ICD-10-CM | POA: Insufficient documentation

## 2013-10-03 DIAGNOSIS — R5381 Other malaise: Secondary | ICD-10-CM | POA: Insufficient documentation

## 2013-10-19 ENCOUNTER — Ambulatory Visit: Payer: Self-pay

## 2013-11-18 ENCOUNTER — Encounter: Payer: Self-pay | Admitting: Internal Medicine

## 2013-11-18 DIAGNOSIS — H409 Unspecified glaucoma: Secondary | ICD-10-CM | POA: Insufficient documentation

## 2013-11-18 DIAGNOSIS — R7303 Prediabetes: Secondary | ICD-10-CM | POA: Insufficient documentation

## 2013-11-18 DIAGNOSIS — E559 Vitamin D deficiency, unspecified: Secondary | ICD-10-CM | POA: Insufficient documentation

## 2013-11-18 DIAGNOSIS — D649 Anemia, unspecified: Secondary | ICD-10-CM | POA: Insufficient documentation

## 2013-11-19 ENCOUNTER — Encounter (HOSPITAL_COMMUNITY): Payer: Self-pay | Admitting: Emergency Medicine

## 2013-11-19 ENCOUNTER — Emergency Department (HOSPITAL_COMMUNITY)
Admission: EM | Admit: 2013-11-19 | Discharge: 2013-11-19 | Disposition: A | Discharge status: Home / Self Care | Payer: Medicare Other | Attending: Emergency Medicine | Admitting: Emergency Medicine

## 2013-11-19 ENCOUNTER — Encounter: Payer: Self-pay | Admitting: Internal Medicine

## 2013-11-19 ENCOUNTER — Emergency Department (HOSPITAL_COMMUNITY): Payer: Medicare Other

## 2013-11-19 DIAGNOSIS — I428 Other cardiomyopathies: Secondary | ICD-10-CM | POA: Insufficient documentation

## 2013-11-19 DIAGNOSIS — M25519 Pain in unspecified shoulder: Secondary | ICD-10-CM | POA: Insufficient documentation

## 2013-11-19 DIAGNOSIS — M25511 Pain in right shoulder: Secondary | ICD-10-CM

## 2013-11-19 DIAGNOSIS — Y92009 Unspecified place in unspecified non-institutional (private) residence as the place of occurrence of the external cause: Secondary | ICD-10-CM | POA: Insufficient documentation

## 2013-11-19 DIAGNOSIS — I519 Heart disease, unspecified: Secondary | ICD-10-CM | POA: Insufficient documentation

## 2013-11-19 DIAGNOSIS — D649 Anemia, unspecified: Secondary | ICD-10-CM | POA: Insufficient documentation

## 2013-11-19 DIAGNOSIS — Z79899 Other long term (current) drug therapy: Secondary | ICD-10-CM | POA: Insufficient documentation

## 2013-11-19 DIAGNOSIS — R296 Repeated falls: Secondary | ICD-10-CM | POA: Insufficient documentation

## 2013-11-19 DIAGNOSIS — W19XXXA Unspecified fall, initial encounter: Secondary | ICD-10-CM

## 2013-11-19 DIAGNOSIS — H409 Unspecified glaucoma: Secondary | ICD-10-CM | POA: Insufficient documentation

## 2013-11-19 DIAGNOSIS — E559 Vitamin D deficiency, unspecified: Secondary | ICD-10-CM | POA: Insufficient documentation

## 2013-11-19 DIAGNOSIS — I10 Essential (primary) hypertension: Secondary | ICD-10-CM | POA: Insufficient documentation

## 2013-11-19 DIAGNOSIS — Y9389 Activity, other specified: Secondary | ICD-10-CM | POA: Insufficient documentation

## 2013-11-19 DIAGNOSIS — E785 Hyperlipidemia, unspecified: Secondary | ICD-10-CM | POA: Insufficient documentation

## 2013-11-19 DIAGNOSIS — M1990 Unspecified osteoarthritis, unspecified site: Secondary | ICD-10-CM | POA: Insufficient documentation

## 2013-11-19 DIAGNOSIS — Z888 Allergy status to other drugs, medicaments and biological substances status: Secondary | ICD-10-CM | POA: Insufficient documentation

## 2013-11-19 NOTE — ED Notes (Signed)
Per pt fell this am in the kitchen on his right arm. Denies LOC or hitting head. sts he was trying to turn the light switch on and slipped. Pt has some mobility in right arm.

## 2013-11-19 NOTE — ED Provider Notes (Signed)
CSN: [id number]     Arrival date & time 11/19/13  4098 History   First MD Initiated Contact with Patient 11/19/13 [phone]     Chief Complaint  Patient presents with  . Fall   (Consider location/radiation/quality/duration/timing/severity/associated sxs/prior Treatment) HPI Joshua Norton is a 79 y.o. male who presented to the emergency department for concern of a fall.  Patient reports reaching for light switch and falling.  No head hit or LOC.  Not using walker as he is supposed to.  No amnesia.  Immediate onset sharp pain in R shoulder.  Worse with movement.  Better with nothing.  No pain radiation.  Moderate in severity.  No other symptoms.  Past Medical History  Diagnosis Date  . Edema of both legs 06/09/2012    2D Echo - EF 40-45, left ventricle cavity moderately dilated  . HTN (hypertension), benign     On multiple medications  . Osteoarthritis (arthritis due to wear and tear of joints)     S/p L Knee Arthroplasty  . Diastolic dysfunction     Chronic  . Cardiomyopathy     EF 40-45% 2D 6/13  . Prediabetes   . Hyperlipidemia   . Anemia   . Vitamin D deficiency   . Glaucoma    Past Surgical History  Procedure Laterality Date  . Left knee arthroplasty  09/2003  . Transurethral resection of prostate    . Cataract extraction    . Cervical laminectomy     Family History  Problem Relation Age of Onset  . Breast cancer Mother   . Stroke Brother    History  Substance Use Topics  . Smoking status: Never Smoker   . Smokeless tobacco: Never Used  . Alcohol Use: No    Review of Systems  Constitutional: Negative for fever and chills.  HENT: Negative for congestion and sore throat.   Respiratory: Negative for cough.   Gastrointestinal: Negative for nausea, vomiting, abdominal pain, diarrhea and constipation.  Endocrine: Negative for polyuria.  Genitourinary: Negative for dysuria and hematuria.  Musculoskeletal: Negative for neck pain.  Skin: Negative for rash.  Neurological:  Negative for headaches.  Psychiatric/Behavioral: Negative.   All other systems reviewed and are negative.    Allergies  Doxazosin  Home Medications   Current Outpatient Rx  Name  Route  Sig  Dispense  Refill  . amLODipine (NORVASC) 5 MG tablet   Oral   Take 5 mg by mouth daily. Patient takes 1/2 tablet         . AZOPT 1 % ophthalmic suspension   Right Eye   Place 1 drop into the right eye daily.         . cloNIDine (CATAPRES) 0.1 MG tablet   Oral   Take 0.1 mg by mouth daily.         . Cyanocobalamin (VITAMIN B-12 PO)   Oral   Take 1 tablet by mouth daily.         . fexofenadine (ALLEGRA) 180 MG tablet   Oral   Take 180 mg by mouth daily.         . furosemide (LASIX) 40 MG tablet   Oral   Take 1 tablet (40 mg total) by mouth 2 (two) times daily.   30 tablet   5     Hold 06/11/12.   Marland Kitchen isosorbide mononitrate (IMDUR) 120 MG 24 hr tablet   Oral   Take 120 mg by mouth daily.         Marland Kitchen  LUMIGAN 0.01 % SOLN               . meloxicam (MOBIC) 15 MG tablet   Oral   Take 1 tablet by mouth daily.         . metolazone (ZAROXOLYN) 2.5 MG tablet   Oral   Take 2.5 mg by mouth daily as needed. For high blood pressure         . metoprolol succinate (TOPROL-XL) 50 MG 24 hr tablet   Oral   Take 1 tablet (50 mg total) by mouth daily. Take with or immediately following a meal.   90 tablet   3   . pantoprazole (PROTONIX) 40 MG tablet   Oral   Take 40 mg by mouth daily.         . quinapril (ACCUPRIL) 40 MG tablet   Oral   Take 40 mg by mouth at bedtime.         Marland Kitchen VITAMIN D, ERGOCALCIFEROL, PO   Oral   Take 1 tablet by mouth daily.          BP 148/70  Pulse 63  Temp(Src) 97.9 F (36.6 C) (Oral)  Resp 18  Ht 5\' 6"  (1.676 m)  Wt 214 lb (97.07 kg)  BMI 34.56 kg/m2  SpO2 100% Physical Exam  Nursing note and vitals reviewed. Constitutional: He is oriented to person, place, and time. He appears well-developed and well-nourished. No  distress.  HENT:  Head: Normocephalic and atraumatic.  Right Ear: External ear normal.  Left Ear: External ear normal.  Mouth/Throat: Oropharynx is clear and moist. No oropharyngeal exudate.  Eyes: Conjunctivae are normal. Pupils are equal, round, and reactive to light. Right eye exhibits no discharge.  Neck: Normal range of motion. Neck supple. No tracheal deviation present.  Cardiovascular: Normal rate, regular rhythm and intact distal pulses.   Pulmonary/Chest: Effort normal. No respiratory distress. He has no wheezes. He has no rales.  Abdominal: Soft. He exhibits no distension. There is no tenderness. There is no rebound and no guarding.  Musculoskeletal:       Right shoulder: He exhibits decreased range of motion (2/2 pain). He exhibits no bony tenderness.  Neurological: He is alert and oriented to person, place, and time.  Skin: Skin is warm and dry. No rash noted. He is not diaphoretic.  Psychiatric: He has a normal mood and affect.    ED Course  Procedures (including critical care time) Labs Review Labs Reviewed - No data to display Imaging Review No results found.  EKG Interpretation   None       MDM   1. Fall, initial encounter   2. Shoulder pain, right     Pradeep Beaubrun is a 79 y.o. male who sustained a mechanical ground level fall.  Now has isolated R shoulder pain.  No other injury.  Still able to ambulate.  No evidence of trauma to C/A/P, spine, or other extremities.  XR of R shoulder without fx or dislocation.  Sling applied for comfort.  Safe for discharge home.  Patient discharged.    Arloa Koh, MD 11/19/13 1538

## 2013-11-19 NOTE — Progress Notes (Signed)
Patient ID: Joshua Norton, male   DOB: 05/31/1919, 79 y.o.   MRN: [id number]   This encounter was created in error - please disregard.

## 2013-11-22 ENCOUNTER — Ambulatory Visit (INDEPENDENT_AMBULATORY_CARE_PROVIDER_SITE_OTHER): Payer: Medicare Other | Admitting: Emergency Medicine

## 2013-11-22 ENCOUNTER — Encounter: Payer: Self-pay | Admitting: Emergency Medicine

## 2013-11-22 VITALS — BP 122/64 | HR 64 | Temp 98.2°F | Resp 18 | Wt 217.0 lb

## 2013-11-22 DIAGNOSIS — M25519 Pain in unspecified shoulder: Secondary | ICD-10-CM

## 2013-11-22 DIAGNOSIS — M25511 Pain in right shoulder: Secondary | ICD-10-CM

## 2013-11-22 DIAGNOSIS — R5381 Other malaise: Secondary | ICD-10-CM

## 2013-11-22 LAB — CBC WITH DIFFERENTIAL/PLATELET
Eosinophils Absolute: 0.1 10*3/uL (ref 0.0–0.7)
HCT: 37.8 % — ABNORMAL LOW (ref 39.0–52.0)
Hemoglobin: 13.2 g/dL (ref 13.0–17.0)
Lymphs Abs: 1.3 10*3/uL (ref 0.7–4.0)
MCH: 30.6 pg (ref 26.0–34.0)
MCHC: 34.9 g/dL (ref 30.0–36.0)
MCV: 87.7 fL (ref 78.0–100.0)
Monocytes Absolute: 0.8 10*3/uL (ref 0.1–1.0)
Monocytes Relative: 16 % — ABNORMAL HIGH (ref 3–12)
Neutro Abs: 2.9 10*3/uL (ref 1.7–7.7)
Neutrophils Relative %: 55 % (ref 43–77)
RBC: 4.31 MIL/uL (ref 4.22–5.81)
WBC: 5.1 10*3/uL (ref 4.0–10.5)

## 2013-11-22 LAB — BASIC METABOLIC PANEL WITH GFR
BUN: 34 mg/dL — ABNORMAL HIGH (ref 6–23)
CO2: 29 mEq/L (ref 19–32)
Chloride: 92 mEq/L — ABNORMAL LOW (ref 96–112)
Creat: 1.29 mg/dL (ref 0.50–1.35)
GFR, Est Non African American: 48 mL/min — ABNORMAL LOW
Potassium: 4.3 mEq/L (ref 3.5–5.3)

## 2013-11-22 LAB — TSH: TSH: 1.068 u[IU]/mL (ref 0.350–4.500)

## 2013-11-22 NOTE — Patient Instructions (Signed)

## 2013-11-23 NOTE — ED Provider Notes (Signed)
I saw and evaluated the patient, reviewed the resident's note and I agree with the findings and plan.  EKG Interpretation   None       Pt with mechanical fall without LOC or headinjury.  Well appearing here and only pain in the right shoulder.  Normal x-ray and concern for rotator cuff injury.  Pt given f/u and d/ced home.  Gwyneth Sprout, MD 11/23/13 [phone]

## 2013-11-25 NOTE — Progress Notes (Signed)
Subjective:    Patient ID: Joshua Norton, male    DOB: March 23, 1919, 79 y.o.   MRN: [id number]  HPI Comments: 79 yo male with recent negative ER evaluation for fall onto right shoulder. Patient notes accidentally had lights turned off and he was disoriented leading him to fall. No falls since episode. He notes difficulty moving arm. He is feeling well overall.  He recently had abnormal labs and notes he does not always take Lasix AD.   He had negative workup at urology for hematuria but willhave recheck in 6 months.   Current Outpatient Prescriptions on File Prior to Visit  Medication Sig Dispense Refill  . amLODipine (NORVASC) 5 MG tablet Take 2.5 mg by mouth daily. Patient takes 1/2 tablet      . AZOPT 1 % ophthalmic suspension Place 1 drop into the right eye daily.      . cloNIDine (CATAPRES) 0.1 MG tablet Take 0.1 mg by mouth daily.      . Cyanocobalamin (VITAMIN B-12 PO) Take 1 tablet by mouth daily.      Marland Kitchen dutasteride (AVODART) 0.5 MG capsule Take 0.5 mg by mouth daily.      . fexofenadine (ALLEGRA) 180 MG tablet Take 180 mg by mouth daily.      . finasteride (PROSCAR) 5 MG tablet Take 5 mg by mouth daily.      . furosemide (LASIX) 40 MG tablet Take 1 tablet (40 mg total) by mouth 2 (two) times daily.  30 tablet  5  . isosorbide mononitrate (IMDUR) 120 MG 24 hr tablet Take 120 mg by mouth daily.      Marland Kitchen LUMIGAN 0.01 % SOLN Place 1 drop into both eyes at bedtime.       . meloxicam (MOBIC) 15 MG tablet Take 1 tablet by mouth daily.      . metolazone (ZAROXOLYN) 2.5 MG tablet Take 2.5 mg by mouth daily as needed. For high blood pressure      . metoprolol succinate (TOPROL-XL) 50 MG 24 hr tablet Take 1 tablet (50 mg total) by mouth daily. Take with or immediately following a meal.  90 tablet  3  . pantoprazole (PROTONIX) 40 MG tablet Take 40 mg by mouth daily.      . quinapril (ACCUPRIL) 40 MG tablet Take 40 mg by mouth at bedtime.      Marland Kitchen VITAMIN D, ERGOCALCIFEROL, PO Take 1 tablet by  mouth daily.       No current facility-administered medications on file prior to visit.   ALLERGIES Doxazosin  Past Medical History  Diagnosis Date  . Edema of both legs 06/09/2012    2D Echo - EF 40-45, left ventricle cavity moderately dilated  . HTN (hypertension), benign     On multiple medications  . Osteoarthritis (arthritis due to wear and tear of joints)     S/p L Knee Arthroplasty  . Diastolic dysfunction     Chronic  . Cardiomyopathy     EF 40-45% 2D 6/13  . Prediabetes   . Hyperlipidemia   . Anemia   . Vitamin D deficiency   . Glaucoma       Review of Systems  Musculoskeletal: Positive for myalgias.       Right shoulder weakness  All other systems reviewed and are negative.    BP 122/64  Pulse 64  Temp(Src) 98.2 F (36.8 C) (Temporal)  Resp 18  Wt 217 lb (98.431 kg)     Objective:   Physical Exam  Nursing note and vitals reviewed. Constitutional: He is oriented to person, place, and time. He appears well-developed and well-nourished.  HENT:  Head: Normocephalic and atraumatic.  Right Ear: External ear normal.  Left Ear: External ear normal.  Nose: Nose normal.  Eyes: Conjunctivae and EOM are normal.  Neck: Normal range of motion. Neck supple. No JVD present. No thyromegaly present.  Cardiovascular: Normal rate, regular rhythm, normal heart sounds and intact distal pulses.   2 + pitting bil LE  Pulmonary/Chest: Effort normal and breath sounds normal.  Abdominal: Soft. Bowel sounds are normal. He exhibits no distension and no mass. There is no tenderness. There is no rebound and no guarding.  Musculoskeletal: He exhibits no edema and no tenderness.  Right shoulder un able to control ROm decreased strength with lowering arm  Lymphadenopathy:    He has no cervical adenopathy.  Neurological: He is alert and oriented to person, place, and time. He has normal reflexes. No cranial nerve deficit. Coordination normal.  Skin: Skin is warm and dry.   Psychiatric: He has a normal mood and affect. His behavior is normal. Judgment and thought content normal.          Assessment & Plan:  1. Recent right shoulder injury- Stretches/ exercises explained if no improvement by Monday refer to Ortho pt w/c 2. Recent abnormal BMP- need recheck 3. Hematuria with recent Urology eval- he will continue to monitor for SX has f/u 6 month with Urology

## 2013-11-30 ENCOUNTER — Ambulatory Visit: Payer: Self-pay

## 2013-12-26 ENCOUNTER — Encounter: Payer: Self-pay | Admitting: Internal Medicine

## 2013-12-26 ENCOUNTER — Ambulatory Visit (INDEPENDENT_AMBULATORY_CARE_PROVIDER_SITE_OTHER): Payer: Medicare HMO | Admitting: Internal Medicine

## 2013-12-26 VITALS — BP 116/68 | HR 60 | Temp 97.9°F | Resp 18 | Wt 226.4 lb

## 2013-12-26 DIAGNOSIS — R7309 Other abnormal glucose: Secondary | ICD-10-CM

## 2013-12-26 DIAGNOSIS — I10 Essential (primary) hypertension: Secondary | ICD-10-CM

## 2013-12-26 DIAGNOSIS — E559 Vitamin D deficiency, unspecified: Secondary | ICD-10-CM

## 2013-12-26 DIAGNOSIS — Z79899 Other long term (current) drug therapy: Secondary | ICD-10-CM

## 2013-12-26 DIAGNOSIS — E782 Mixed hyperlipidemia: Secondary | ICD-10-CM

## 2013-12-26 LAB — CBC WITH DIFFERENTIAL/PLATELET
BASOS ABS: 0.1 10*3/uL (ref 0.0–0.1)
Basophils Relative: 1 % (ref 0–1)
Eosinophils Absolute: 0.1 10*3/uL (ref 0.0–0.7)
Eosinophils Relative: 2 % (ref 0–5)
HCT: 38.6 % — ABNORMAL LOW (ref 39.0–52.0)
HEMOGLOBIN: 13.3 g/dL (ref 13.0–17.0)
Lymphocytes Relative: 29 % (ref 12–46)
Lymphs Abs: 1.4 10*3/uL (ref 0.7–4.0)
MCH: 31.1 pg (ref 26.0–34.0)
MCHC: 34.5 g/dL (ref 30.0–36.0)
MCV: 90.4 fL (ref 78.0–100.0)
Monocytes Absolute: 0.7 10*3/uL (ref 0.1–1.0)
Monocytes Relative: 14 % — ABNORMAL HIGH (ref 3–12)
NEUTROS ABS: 2.6 10*3/uL (ref 1.7–7.7)
Neutrophils Relative %: 54 % (ref 43–77)
PLATELETS: 267 10*3/uL (ref 150–400)
RBC: 4.27 MIL/uL (ref 4.22–5.81)
RDW: 13.5 % (ref 11.5–15.5)
WBC: 4.8 10*3/uL (ref 4.0–10.5)

## 2013-12-26 LAB — LIPID PANEL
CHOL/HDL RATIO: 2.7 ratio
Cholesterol: 139 mg/dL (ref 0–200)
HDL: 52 mg/dL (ref >39)
LDL Cholesterol: 72 mg/dL (ref 0–99)
TRIGLYCERIDES: 77 mg/dL (ref <150)
VLDL: 15 mg/dL (ref 0–40)

## 2013-12-26 LAB — HEPATIC FUNCTION PANEL
ALT: 12 U/L (ref 0–53)
AST: 16 U/L (ref 0–37)
Albumin: 3.7 g/dL (ref 3.5–5.2)
Alkaline Phosphatase: 126 U/L — ABNORMAL HIGH (ref 39–117)
BILIRUBIN DIRECT: 0.2 mg/dL (ref 0.0–0.3)
BILIRUBIN INDIRECT: 0.4 mg/dL (ref 0.0–0.9)
TOTAL PROTEIN: 6.8 g/dL (ref 6.0–8.3)
Total Bilirubin: 0.6 mg/dL (ref 0.3–1.2)

## 2013-12-26 LAB — BASIC METABOLIC PANEL WITH GFR
BUN: 19 mg/dL (ref 6–23)
CALCIUM: 9 mg/dL (ref 8.4–10.5)
CO2: 28 mEq/L (ref 19–32)
Chloride: 94 mEq/L — ABNORMAL LOW (ref 96–112)
Creat: 0.96 mg/dL (ref 0.50–1.35)
GFR, EST AFRICAN AMERICAN: 79 mL/min
GFR, EST NON AFRICAN AMERICAN: 68 mL/min
Glucose, Bld: 77 mg/dL (ref 70–99)
POTASSIUM: 4.7 meq/L (ref 3.5–5.3)
Sodium: 130 mEq/L — ABNORMAL LOW (ref 135–145)

## 2013-12-26 LAB — MAGNESIUM: Magnesium: 1.7 mg/dL (ref 1.5–2.5)

## 2013-12-26 NOTE — Patient Instructions (Signed)
Hypertension As your heart beats, it forces blood through your arteries. This force is your blood pressure. If the pressure is too high, it is called hypertension (HTN) or high blood pressure. HTN is dangerous because you may have it and not know it. High blood pressure may mean that your heart has to work harder to pump blood. Your arteries may be narrow or stiff. The extra work puts you at risk for heart disease, stroke, and other problems.  Blood pressure consists of two numbers, a higher number over a lower, 110/72, for example. It is stated as "110 over 72." The ideal is below 120 for the top number (systolic) and under 80 for the bottom (diastolic). Write down your blood pressure today. You should pay close attention to your blood pressure if you have certain conditions such as:  Heart failure.  Prior heart attack.  Diabetes  Chronic kidney disease.  Prior stroke.  Multiple risk factors for heart disease. To see if you have HTN, your blood pressure should be measured while you are seated with your arm held at the level of the heart. It should be measured at least twice. A one-time elevated blood pressure reading (especially in the Emergency Department) does not mean that you need treatment. There may be conditions in which the blood pressure is different between your right and left arms. It is important to see your caregiver soon for a recheck. Most people have essential hypertension which means that there is not a specific cause. This type of high blood pressure may be lowered by changing lifestyle factors such as:  Stress.  Smoking.  Lack of exercise.  Excessive weight.  Drug/tobacco/alcohol use.  Eating less salt. Most people do not have symptoms from high blood pressure until it has caused damage to the body. Effective treatment can often prevent, delay or reduce that damage. TREATMENT  When a cause has been identified, treatment for high blood pressure is directed at the  cause. There are a large number of medications to treat HTN. These fall into several categories, and your caregiver will help you select the medicines that are best for you. Medications may have side effects. You should review side effects with your caregiver. If your blood pressure stays high after you have made lifestyle changes or started on medicines,   Your medication(s) may need to be changed.  Other problems may need to be addressed.  Be certain you understand your prescriptions, and know how and when to take your medicine.  Be sure to follow up with your caregiver within the time frame advised (usually within two weeks) to have your blood pressure rechecked and to review your medications.  If you are taking more than one medicine to lower your blood pressure, make sure you know how and at what times they should be taken. Taking two medicines at the same time can result in blood pressure that is too low. SEEK IMMEDIATE MEDICAL CARE IF:  You develop a severe headache, blurred or changing vision, or confusion.  You have unusual weakness or numbness, or a faint feeling.  You have severe chest or abdominal pain, vomiting, or breathing problems. MAKE SURE YOU:   Understand these instructions.  Will watch your condition.  Will get help right away if you are not doing well or get worse. Document Released: 11/29/2005 Document Revised: 02/21/2012 Document Reviewed: 07/19/2008 [employer] Patient Information 2014 [employer].  Diabetes and Exercise Exercising regularly is important. It is not just about losing weight. It   has many health benefits, such as:  Improving your overall fitness, flexibility, and endurance.  Increasing your bone density.  Helping with weight control.  Decreasing your body fat.  Increasing your muscle strength.  Reducing stress and tension.  Improving your overall health. People with diabetes who exercise gain additional benefits because  exercise:  Reduces appetite.  Improves the body's use of blood sugar (glucose).  Helps lower or control blood glucose.  Decreases blood pressure.  Helps control blood lipids (such as cholesterol and triglycerides).  Improves the body's use of the hormone insulin by:  Increasing the body's insulin sensitivity.  Reducing the body's insulin needs.  Decreases the risk for heart disease because exercising:  Lowers cholesterol and triglycerides levels.  Increases the levels of good cholesterol (such as high-density lipoproteins [HDL]) in the body.  Lowers blood glucose levels. YOUR ACTIVITY PLAN  Choose an activity that you enjoy and set realistic goals. Your health care provider or diabetes educator can help you make an activity plan that works for you. You can break activities into 2 or 3 sessions throughout the day. Doing so is as good as one long session. Exercise ideas include:  Taking the dog for a walk.  Taking the stairs instead of the elevator.  Dancing to your favorite song.  Doing your favorite exercise with a friend. RECOMMENDATIONS FOR EXERCISING WITH TYPE 1 OR TYPE 2 DIABETES   Check your blood glucose before exercising. If blood glucose levels are greater than 240 mg/dL, check for urine ketones. Do not exercise if ketones are present.  Avoid injecting insulin into areas of the body that are going to be exercised. For example, avoid injecting insulin into:  The arms when playing tennis.  The legs when jogging.  Keep a record of:  Food intake before and after you exercise.  Expected peak times of insulin action.  Blood glucose levels before and after you exercise.  The type and amount of exercise you have done.  Review your records with your health care provider. Your health care provider will help you to develop guidelines for adjusting food intake and insulin amounts before and after exercising.  If you take insulin or oral hypoglycemic agents, watch  for signs and symptoms of hypoglycemia. They include:  Dizziness.  Shaking.  Sweating.  Chills.  Confusion.  Drink plenty of water while you exercise to prevent dehydration or heat stroke. Body water is lost during exercise and must be replaced.  Talk to your health care provider before starting an exercise program to make sure it is safe for you. Remember, almost any type of activity is better than none. Document Released: 02/19/2004 Document Revised: 08/01/2013 Document Reviewed: 05/08/2013 [employer] Patient Information 2014 [employer].  Cholesterol Cholesterol is a white, waxy, fat-like protein needed by your body in small amounts. The liver makes all the cholesterol you need. It is carried from the liver by the blood through the blood vessels. Deposits (plaque) may build up on blood vessel walls. This makes the arteries narrower and stiffer. Plaque increases the risk for heart attack and stroke. You cannot feel your cholesterol level even if it is very high. The only way to know is by a blood test to check your lipid (fats) levels. Once you know your cholesterol levels, you should keep a record of the test results. Work with your caregiver to to keep your levels in the desired range. WHAT THE RESULTS MEAN:  Total cholesterol is a rough measure of all the cholesterol   in your blood.  LDL is the so-called bad cholesterol. This is the type that deposits cholesterol in the walls of the arteries. You want this level to be low.  HDL is the good cholesterol because it cleans the arteries and carries the LDL away. You want this level to be high.  Triglycerides are fat that the body can either burn for energy or store. High levels are closely linked to heart disease. DESIRED LEVELS:  Total cholesterol below 200.  LDL below 100 for people at risk, below 70 for very high risk.  HDL above 50 is good, above 60 is best.  Triglycerides below 150. HOW TO LOWER YOUR  CHOLESTEROL:  Diet.  Choose fish or white meat chicken and turkey, roasted or baked. Limit fatty cuts of red meat, fried foods, and processed meats, such as sausage and lunch meat.  Eat lots of fresh fruits and vegetables. Choose whole grains, beans, pasta, potatoes and cereals.  Use only small amounts of olive, corn or canola oils. Avoid butter, mayonnaise, shortening or palm kernel oils. Avoid foods with trans-fats.  Use skim/nonfat milk and low-fat/nonfat yogurt and cheeses. Avoid whole milk, cream, ice cream, egg yolks and cheeses. Healthy desserts include angel food cake, ginger snaps, animal crackers, hard candy, popsicles, and low-fat/nonfat frozen yogurt. Avoid pastries, cakes, pies and cookies.  Exercise.  A regular program helps decrease LDL and raises HDL.  Helps with weight control.  Do things that increase your activity level like gardening, walking, or taking the stairs.  Medication.  May be prescribed by your caregiver to help lowering cholesterol and the risk for heart disease.  You may need medicine even if your levels are normal if you have several risk factors. HOME CARE INSTRUCTIONS   Follow your diet and exercise programs as suggested by your caregiver.  Take medications as directed.  Have blood work done when your caregiver feels it is necessary. MAKE SURE YOU:   Understand these instructions.  Will watch your condition.  Will get help right away if you are not doing well or get worse. Document Released: 08/24/2001 Document Revised: 02/21/2012 Document Reviewed: 09/12/2013 [employer] Patient Information 2014 [employer].  Vitamin D Deficiency Vitamin D is an important vitamin that your body needs. Having too little of it in your body is called a deficiency. A very bad deficiency can make your bones soft and can cause a condition called rickets.  Vitamin D is important to your body for different reasons, such as:   It helps your body absorb 2  minerals called calcium and phosphorus.  It helps make your bones healthy.  It may prevent some diseases, such as diabetes and multiple sclerosis.  It helps your muscles and heart. You can get vitamin D in several ways. It is a natural part of some foods. The vitamin is also added to some dairy products and cereals. Some people take vitamin D supplements. Also, your body makes vitamin D when you are in the sun. It changes the sun's rays into a form of the vitamin that your body can use. CAUSES   Not eating enough foods that contain vitamin D.  Not getting enough sunlight.  Having certain digestive system diseases that make it hard to absorb vitamin D. These diseases include Crohn's disease, chronic pancreatitis, and cystic fibrosis.  Having a surgery in which part of the stomach or small intestine is removed.  Being obese. Fat cells pull vitamin D out of your blood. That means that obese people   may not have enough vitamin D left in their blood and in other body tissues.  Having chronic kidney or liver disease. RISK FACTORS Risk factors are things that make you more likely to develop a vitamin D deficiency. They include:  Being older.  Not being able to get outside very much.  Living in a nursing home.  Having had broken bones.  Having weak or thin bones (osteoporosis).  Having a disease or condition that changes how your body absorbs vitamin D.  Having dark skin.  Some medicines such as seizure medicines or steroids.  Being overweight or obese. SYMPTOMS Mild cases of vitamin D deficiency may not have any symptoms. If you have a very bad case, symptoms may include:  Bone pain.  Muscle pain.  Falling often.  Broken bones caused by a minor injury, due to osteoporosis. DIAGNOSIS A blood test is the best way to tell if you have a vitamin D deficiency. TREATMENT Vitamin D deficiency can be treated in different ways. Treatment for vitamin D deficiency depends on what is  causing it. Options include:  Taking vitamin D supplements.  Taking a calcium supplement. Your caregiver will suggest what dose is best for you. HOME CARE INSTRUCTIONS  Take any supplements that your caregiver prescribes. Follow the directions carefully. Take only the suggested amount.  Have your blood tested 2 months after you start taking supplements.  Eat foods that contain vitamin D. Healthy choices include:  Fortified dairy products, cereals, or juices. Fortified means vitamin D has been added to the food. Check the label on the package to be sure.  Fatty fish like salmon or trout.  Eggs.  Oysters.  Do not use a tanning bed.  Keep your weight at a healthy level. Lose weight if you need to.  Keep all follow-up appointments. Your caregiver will need to perform blood tests to make sure your vitamin D deficiency is going away. SEEK MEDICAL CARE IF:  You have any questions about your treatment.  You continue to have symptoms of vitamin D deficiency.  You have nausea or vomiting.  You are constipated.  You feel confused.  You have severe abdominal or back pain. MAKE SURE YOU:  Understand these instructions.  Will watch your condition.  Will get help right away if you are not doing well or get worse. Document Released: 02/21/2012 Document Revised: 03/26/2013 Document Reviewed: 02/21/2012 [employer] Patient Information 2014 [employer].  

## 2013-12-26 NOTE — Progress Notes (Signed)
Patient ID: Joshua Norton, male   DOB: [birth year], 80 y.o.   MRN: [id number]   This very nice 80 y.o. male presents for 1 month follow up of abnormal  Labs with BUN/Creat 34/1.29 and GFR calc 48 consistant with moderate renal insufficiency.with Hypertension, ASHD, Hyperlipidemia, Pre-Diabetes and Vitamin D Deficiency.    HTN predates since 57. BP has been controlled at home. Today's BP: 116/68 mmHg . In 2013, he was hospitalized and tx'd for CHF and has done well since. Patient denies any cardiac type chest pain, palpitations, dyspnea/orthopnea/PND, dizziness, claudication, or dependent edema.   Hyperlipidemia is controlled with diet & meds. Last Cholesterol was 143, Triglycerides were 81, HDL  50 and LDL  77 in Oct 2014. Patient denies myalgias or other med SE's.    Also, the patient has history of PreDiabetes/insulin resistance since Feb 2013 with an A1c 6.4 % and with last A1c of  6.0 % in Oct 2014. Patient denies any symptoms of reactive hypoglycemia, diabetic polys, paresthesias or visual blurring.   Further, Patient has history of Vitamin D Deficiency with last vitamin D of 74 in Oct 2014. Patient supplements vitamin D without any suspected side-effects.    Medication List       amLODipine 5 MG tablet  Commonly known as:  NORVASC  Take 2.5 mg by mouth daily. Patient takes 1/2 tablet     AZOPT 1 % ophthalmic suspension  Generic drug:  brinzolamide  Place 1 drop into the right eye daily.     cloNIDine 0.1 MG tablet  Commonly known as:  CATAPRES  Take 0.1 mg by mouth daily.     dutasteride 0.5 MG capsule  Commonly known as:  AVODART  Take 0.5 mg by mouth daily.     fexofenadine 180 MG tablet  Commonly known as:  ALLEGRA  Take 180 mg by mouth daily.     finasteride 5 MG tablet  Commonly known as:  PROSCAR  Take 5 mg by mouth daily.     furosemide 40 MG tablet  Commonly known as:  LASIX  Take 1 tablet (40 mg total) by mouth 2 (two) times daily.     isosorbide mononitrate  120 MG 24 hr tablet  Commonly known as:  IMDUR  Take 120 mg by mouth daily.     LUMIGAN 0.01 % Soln  Generic drug:  bimatoprost  Place 1 drop into both eyes at bedtime.     meloxicam 15 MG tablet  Commonly known as:  MOBIC  Take 1 tablet by mouth daily.     metolazone 2.5 MG tablet  Commonly known as:  ZAROXOLYN  Take 2.5 mg by mouth daily as needed. For high blood pressure     metoprolol succinate 50 MG 24 hr tablet  Commonly known as:  TOPROL-XL  Take 1 tablet (50 mg total) by mouth daily. Take with or immediately following a meal.     pantoprazole 40 MG tablet  Commonly known as:  PROTONIX  Take 40 mg by mouth daily.     quinapril 40 MG tablet  Commonly known as:  ACCUPRIL  Take 40 mg by mouth at bedtime.     VITAMIN B-12 PO  Take 1 tablet by mouth daily.     VITAMIN D (ERGOCALCIFEROL) PO  Take 1 tablet by mouth daily.         Allergies  Allergen Reactions  . Doxazosin Swelling    PMHx:   Past Medical History  Diagnosis Date  . Edema  of both legs 06/09/2012    2D Echo - EF 40-45, left ventricle cavity moderately dilated  . HTN (hypertension), benign     On multiple medications  . Osteoarthritis (arthritis due to wear and tear of joints)     S/p L Knee Arthroplasty  . Diastolic dysfunction     Chronic  . Cardiomyopathy     EF 40-45% 2D 6/13  . Prediabetes   . Hyperlipidemia   . Anemia   . Vitamin D deficiency   . Glaucoma     FHx:    Reviewed / unchanged  SHx:    Reviewed / unchanged  Systems Review: Constitutional: Denies fever, chills, wt changes, headaches, insomnia, fatigue, night sweats, change in appetite. Eyes: Denies redness, blurred vision, diplopia, discharge, itchy, watery eyes.  ENT: Denies discharge, congestion, post nasal drip, epistaxis, sore throat, earache, hearing loss, dental pain, tinnitus, vertigo, sinus pain, snoring.  CV: Denies chest pain, palpitations, irregular heartbeat, syncope, dyspnea, diaphoresis, orthopnea, PND,  claudication, edema. Respiratory: denies cough, dyspnea, DOE, pleurisy, hoarseness, laryngitis, wheezing.  Gastrointestinal: Denies dysphagia, odynophagia, heartburn, reflux, water brash, abdominal pain or cramps, nausea, vomiting, bloating, diarrhea, constipation, hematemesis, melena, hematochezia,  or hemorrhoids. Genitourinary: Denies dysuria, frequency, urgency, nocturia, hesitancy, discharge, hematuria, flank pain. Musculoskeletal: Denies arthralgias, myalgias, stiffness, jt. swelling, pain, limp, strain/sprain.  Skin: Denies pruritus, rash, hives, warts, acne, eczema, change in skin lesion(s). Neuro: No weakness, tremor, incoordination, spasms, paresthesia, or pain. Psychiatric: Denies confusion, memory loss, or sensory loss. Endo: Denies change in weight, skin, hair change.  Heme/Lymph: No excessive bleeding, bruising, orenlarged lymph nodes.  BP: 116/68  Pulse: 60  Temp: 97.9 F (36.6 C)  Resp: 18    Estimated body mass index is 36.56 kg/(m^2) as calculated from the following:   Height as of 11/19/13: 5\' 6"  (1.676 m).   Weight as of this encounter: 226 lb 6.4 oz (102.694 kg).  On Exam: Appears well nourished - in no distress. Eyes: PERRLA, EOMs, conjunctiva no swelling or erythema. Sinuses: No frontal/maxillary tenderness ENT/Mouth: EAC's clear, TM's nl w/o erythema, bulging. Nares clear w/o erythema, swelling, exudates. Oropharynx clear without erythema or exudates. Oral hygiene is good. Tongue normal, non obstructing. Hearing intact.  Neck: Supple. Thyroid nl. Car 2+/2+ without bruits, nodes or JVD. Chest: Respirations nl with BS clear & equal w/o rales, rhonchi, wheezing or stridor.  Cor: Heart sounds normal w/ regular rate and rhythm without sig. murmurs, gallops, clicks, or rubs. Peripheral pulses normal and equal  without edema.  Abdomen: Soft & bowel sounds normal. Non-tender w/o guarding, rebound, hernias, masses, or organomegaly.  Lymphatics: Unremarkable.   Musculoskeletal: Full ROM all peripheral extremities, joint stability, 5/5 strength, and normal gait.  Skin: Warm, dry without exposed rashes, lesions, ecchymosis apparent.  Neuro: Cranial nerves intact, reflexes equal bilaterally. Sensory-motor testing grossly intact. Tendon reflexes grossly intact.  Pysch: Alert & oriented x 3. Insight and judgement nl & appropriate. No ideations.  Assessment and Plan:  1. Hypertension  & ASHD - Continue monitor blood pressure at home. Continue diet/meds same.  2. Hyperlipidemia - Continue diet/meds, exercise,& lifestyle modifications. Continue monitor periodic cholesterol/liver & renal functions   3. Pre-diabetes/Insulin Resistance - Continue diet, exercise, lifestyle modifications. Monitor appropriate labs.  4. Vitamin D Deficiency - Continue supplementation.  Recommended regular exercise, BP monitoring, weight control, and discussed med and SE's. Recommended labs to assess and monitor clinical status. Further disposition pending results of labs.

## 2013-12-27 LAB — INSULIN, FASTING: Insulin fasting, serum: 8 u[IU]/mL (ref 3–28)

## 2013-12-27 LAB — VITAMIN D 25 HYDROXY (VIT D DEFICIENCY, FRACTURES): VIT D 25 HYDROXY: 72 ng/mL (ref 30–89)

## 2013-12-27 LAB — TSH: TSH: 1.188 u[IU]/mL (ref 0.350–4.500)

## 2013-12-27 LAB — HEMOGLOBIN A1C
Hgb A1c MFr Bld: 6.3 % — ABNORMAL HIGH (ref <5.7)
MEAN PLASMA GLUCOSE: 134 mg/dL — AB (ref <117)

## 2013-12-28 ENCOUNTER — Other Ambulatory Visit: Payer: Self-pay | Admitting: Internal Medicine

## 2013-12-28 ENCOUNTER — Other Ambulatory Visit: Payer: Self-pay | Admitting: Physician Assistant

## 2013-12-28 MED ORDER — MELOXICAM 15 MG PO TABS: 15.0000 mg | ORAL_TABLET | Freq: Every day | ORAL | Status: DC

## 2013-12-28 MED ORDER — ISOSORBIDE MONONITRATE ER 120 MG PO TB24: 120.0000 mg | ORAL_TABLET | Freq: Every day | ORAL | Status: DC

## 2014-03-23 ENCOUNTER — Encounter (HOSPITAL_COMMUNITY): Payer: Self-pay | Admitting: Emergency Medicine

## 2014-03-23 ENCOUNTER — Emergency Department (HOSPITAL_COMMUNITY)
Admission: EM | Admit: 2014-03-23 | Discharge: 2014-03-24 | Disposition: A | Discharge status: Home / Self Care | Payer: Medicare HMO | Attending: Emergency Medicine | Admitting: Emergency Medicine

## 2014-03-23 ENCOUNTER — Emergency Department (HOSPITAL_COMMUNITY): Payer: Medicare HMO

## 2014-03-23 DIAGNOSIS — S4980XA Other specified injuries of shoulder and upper arm, unspecified arm, initial encounter: Secondary | ICD-10-CM | POA: Insufficient documentation

## 2014-03-23 DIAGNOSIS — W19XXXA Unspecified fall, initial encounter: Secondary | ICD-10-CM

## 2014-03-23 DIAGNOSIS — W230XXA Caught, crushed, jammed, or pinched between moving objects, initial encounter: Secondary | ICD-10-CM | POA: Insufficient documentation

## 2014-03-23 DIAGNOSIS — M25511 Pain in right shoulder: Secondary | ICD-10-CM

## 2014-03-23 DIAGNOSIS — M1710 Unilateral primary osteoarthritis, unspecified knee: Secondary | ICD-10-CM | POA: Insufficient documentation

## 2014-03-23 DIAGNOSIS — D649 Anemia, unspecified: Secondary | ICD-10-CM | POA: Insufficient documentation

## 2014-03-23 DIAGNOSIS — I10 Essential (primary) hypertension: Secondary | ICD-10-CM | POA: Insufficient documentation

## 2014-03-23 DIAGNOSIS — Y990 Civilian activity done for income or pay: Secondary | ICD-10-CM | POA: Insufficient documentation

## 2014-03-23 DIAGNOSIS — S99919A Unspecified injury of unspecified ankle, initial encounter: Secondary | ICD-10-CM

## 2014-03-23 DIAGNOSIS — E559 Vitamin D deficiency, unspecified: Secondary | ICD-10-CM | POA: Insufficient documentation

## 2014-03-23 DIAGNOSIS — E785 Hyperlipidemia, unspecified: Secondary | ICD-10-CM | POA: Insufficient documentation

## 2014-03-23 DIAGNOSIS — IMO0002 Reserved for concepts with insufficient information to code with codable children: Secondary | ICD-10-CM | POA: Insufficient documentation

## 2014-03-23 DIAGNOSIS — R296 Repeated falls: Secondary | ICD-10-CM | POA: Insufficient documentation

## 2014-03-23 DIAGNOSIS — S8990XA Unspecified injury of unspecified lower leg, initial encounter: Secondary | ICD-10-CM | POA: Insufficient documentation

## 2014-03-23 DIAGNOSIS — R609 Edema, unspecified: Secondary | ICD-10-CM | POA: Insufficient documentation

## 2014-03-23 DIAGNOSIS — S46909A Unspecified injury of unspecified muscle, fascia and tendon at shoulder and upper arm level, unspecified arm, initial encounter: Secondary | ICD-10-CM | POA: Insufficient documentation

## 2014-03-23 DIAGNOSIS — Y9289 Other specified places as the place of occurrence of the external cause: Secondary | ICD-10-CM | POA: Insufficient documentation

## 2014-03-23 DIAGNOSIS — S99929A Unspecified injury of unspecified foot, initial encounter: Secondary | ICD-10-CM

## 2014-03-23 DIAGNOSIS — Z79899 Other long term (current) drug therapy: Secondary | ICD-10-CM | POA: Insufficient documentation

## 2014-03-23 DIAGNOSIS — Z791 Long term (current) use of non-steroidal anti-inflammatories (NSAID): Secondary | ICD-10-CM | POA: Insufficient documentation

## 2014-03-23 DIAGNOSIS — Z96659 Presence of unspecified artificial knee joint: Secondary | ICD-10-CM | POA: Insufficient documentation

## 2014-03-23 DIAGNOSIS — Y9389 Activity, other specified: Secondary | ICD-10-CM | POA: Insufficient documentation

## 2014-03-23 DIAGNOSIS — Z8669 Personal history of other diseases of the nervous system and sense organs: Secondary | ICD-10-CM | POA: Insufficient documentation

## 2014-03-23 NOTE — ED Notes (Signed)
Pt to department via EMS- pt reports that he fell earlier this afternoon. States that his right ankle became trapped under the door. Pt with swelling to the ankle, pulse pedal pulses. Bp- 178/88 Hr-82

## 2014-03-23 NOTE — ED Provider Notes (Signed)
CSN: [id number]     Arrival date & time 03/23/14  41 History   First MD Initiated Contact with Patient 03/23/14 1917     Chief Complaint  Patient presents with  . Fall     (Consider location/radiation/quality/duration/timing/severity/associated sxs/prior Treatment) Patient is a 80 y.o. male presenting with fall. The history is provided by the patient. No language interpreter was used.  Fall Associated symptoms include arthralgias (right shoulder and foot pain ). Pertinent negatives include no chills, fever or weakness.  Joshua Norton is a 80 y/o M with PMhx of chronic edema, hypertension, cardiomyopathy, hyperlipidemia, glaucoma presenting to the ED after a fall that occurred this evening. Patient currently lives at home alone, walks with a walker. Reported that his right leg gave out and patient fell, reported that his right foot got stuck inbetween the doorway - reported that his right foot is sore. Reported that he has been having right shoulder pain that increased after the fall today, described as just a pain. Reported that he has been having right shoulder pain since he had a fall back during Christmas time of 2014. Patient reported that he called EMS. Stated that he was on the floor for one hour secondary to discomfort. Denied head injury, loss of consciousness, neck pain, back pain, knee pain, wrist pain, chest pain, shortness of breath, difficulty breathing, numbness, tingling, sudden loss of vision, blurred vision, dizziness, syncope, weakness. PCP Dr. [name]  Past Medical History  Diagnosis Date  . Edema of both legs 06/09/2012    2D Echo - EF 40-45, left ventricle cavity moderately dilated  . HTN (hypertension), benign     On multiple medications  . Osteoarthritis (arthritis due to wear and tear of joints)     S/p L Knee Arthroplasty  . Diastolic dysfunction     Chronic  . Cardiomyopathy     EF 40-45% 2D 6/13  . Prediabetes   . Hyperlipidemia   . Anemia   . Vitamin D  deficiency   . Glaucoma    Past Surgical History  Procedure Laterality Date  . Left knee arthroplasty  09/2003  . Transurethral resection of prostate    . Cataract extraction    . Cervical laminectomy     Family History  Problem Relation Age of Onset  . Breast cancer Mother   . Stroke Brother    History  Substance Use Topics  . Smoking status: Never Smoker   . Smokeless tobacco: Never Used  . Alcohol Use: No    Review of Systems  Constitutional: Negative for fever and chills.  Respiratory: Negative for chest tightness and shortness of breath.   Musculoskeletal: Positive for arthralgias (right shoulder and foot pain ).  Neurological: Negative for syncope and weakness.  All other systems reviewed and are negative.     Allergies  Doxazosin  Home Medications   Current Outpatient Rx  Name  Route  Sig  Dispense  Refill  . amLODipine (NORVASC) 5 MG tablet   Oral   Take 5 mg by mouth daily.         . AZOPT 1 % ophthalmic suspension   Right Eye   Place 1 drop into the right eye daily.         . cloNIDine (CATAPRES) 0.1 MG tablet   Oral   Take 0.1 mg by mouth daily.         . Cyanocobalamin (VITAMIN B-12 PO)   Oral   Take 1 tablet by mouth daily.         Marland Kitchen  dutasteride (AVODART) 0.5 MG capsule   Oral   Take 0.5 mg by mouth daily.         . fexofenadine (ALLEGRA) 180 MG tablet   Oral   Take 180 mg by mouth daily.         . finasteride (PROSCAR) 5 MG tablet   Oral   Take 5 mg by mouth daily.         . furosemide (LASIX) 40 MG tablet   Oral   Take 40 mg by mouth daily.         . isosorbide mononitrate (IMDUR) 120 MG 24 hr tablet   Oral   Take 1 tablet (120 mg total) by mouth daily.   90 tablet   1   . LUMIGAN 0.01 % SOLN   Both Eyes   Place 1 drop into both eyes at bedtime.          . meloxicam (MOBIC) 15 MG tablet   Oral   Take 1 tablet (15 mg total) by mouth daily.   90 tablet   1   . metolazone (ZAROXOLYN) 2.5 MG tablet    Oral   Take 2.5 mg by mouth every other day.         . metoprolol succinate (TOPROL-XL) 50 MG 24 hr tablet   Oral   Take 1 tablet (50 mg total) by mouth daily. Take with or immediately following a meal.   90 tablet   3   . pantoprazole (PROTONIX) 20 MG tablet   Oral   Take 20 mg by mouth daily.         . quinapril (ACCUPRIL) 40 MG tablet   Oral   Take 40 mg by mouth at bedtime.         Marland Kitchen VITAMIN D, ERGOCALCIFEROL, PO   Oral   Take 2 tablets by mouth daily.           BP 132/59  Pulse 73  Temp(Src) 98.6 F (37 C) (Oral)  Resp 16  Ht 5\' 7"  (1.702 m)  Wt 226 lb (102.513 kg)  BMI 35.39 kg/m2  SpO2 98% Physical Exam  Nursing note and vitals reviewed. Constitutional: He is oriented to person, place, and time. He appears well-developed and well-nourished. No distress.  HENT:  Head: Normocephalic and atraumatic. Not macrocephalic and not microcephalic. Head is without raccoon's eyes, without Battle's sign, without abrasion, without contusion and without laceration.  Mouth/Throat: Oropharynx is clear and moist. No oropharyngeal exudate.  Negative facial trauma Negative palpation hematomas  Eyes: Conjunctivae and EOM are normal. Pupils are equal, round, and reactive to light. Right eye exhibits no discharge. Left eye exhibits no discharge.  Negative nystagmus Visual fields grossly intact  Neck: Normal range of motion. Neck supple. No tracheal deviation present.  Negative neck stiffness Negative nuchal rigidity Negative pain upon palpation to the C-spine  Cardiovascular: Normal rate, regular rhythm and normal heart sounds.  Exam reveals no friction rub.   No murmur heard. Leg edema identified-bilateral leg swelling with pitting edema - negative cellulitic findings  Pulmonary/Chest: Effort normal and breath sounds normal. No respiratory distress. He has no wheezes. He has no rales. He exhibits no tenderness.  Patient able to speak in full sentences without  difficulty Negative use of accessory muscles Negative stridor  Musculoskeletal: Normal range of motion. He exhibits tenderness.  Negative swelling, erythema, inflammation, lesions, sores, deformities, sunken in appearance noted to the right shoulder. Discomfort upon palpation to the anterior aspect of the  right shoulder. Negative clavicular tenting identified. Negative discomfort upon palpation to the clavicle. Full range of motion noted to the right shoulder-full flexion, extension, abduction, abduction. Full range of motion noted to the right elbow. Full pronation and supination. Full range of motion to the right wrist with negative discomfort upon palpation. Negative deformities identified to the right upper extremity. Full range of motion to the digits without difficulty the right hand. Negative erythema, negative deformities noted to the right ankle. Full ROM noted to the right ankle, foot and digits.   Lymphadenopathy:    He has no cervical adenopathy.  Neurological: He is alert and oriented to person, place, and time. No cranial nerve deficit. He exhibits normal muscle tone. Coordination normal.  Cranial nerves III-XII grossly intact Strength 5+/5+ to upper and lower extremities bilaterally with resistance applied, equal distribution noted Strength intact to the MCP, PIP, DIP joints of the right hand Sensation intact with differentiation sharp and dull touch Negative arm drift Fine motor skills intact  Skin: Skin is warm and dry. No rash noted. He is not diaphoretic. No erythema.  Psychiatric: He has a normal mood and affect. His behavior is normal. Thought content normal.    ED Course  Procedures (including critical care time)  10:50 PM Patient seen and assessed by attending physician who reported that patient able to ambulate without difficulty. Recommended right tib/fib plain film to be performed and if negative patient can be discharged.   Labs Review Labs Reviewed - No data to  display Imaging Review Dg Chest 1 View  03/23/2014   CLINICAL DATA:  Trauma.  Fall.  EXAM: CHEST - 1 VIEW  COMPARISON:  10/03/2013  FINDINGS: Shallow inspiration. Normal heart size and pulmonary vascularity. Prominence of the aortic shadow is likely due to patient rotation and appears stable since previous study. Stable soft tissue prominence about the trachea likely indicates enlarged thyroid gland. No focal airspace disease or consolidation in the lungs. No pneumothorax or effusion.  IMPRESSION: No evidence of active pulmonary disease. No significant change since previous study.   Electronically Signed   By: [name] M.D.   On: 03/23/2014 22:22   Dg Pelvis 1-2 Views  03/23/2014   CLINICAL DATA:  Trauma.  Fall.  EXAM: PELVIS - 1-2 VIEW  COMPARISON:  None.  FINDINGS: There is no evidence of pelvic fracture or diastasis. No other pelvic bone lesions are seen.  IMPRESSION: Negative.   Electronically Signed   By: [name] M.D.   On: 03/23/2014 22:45   Dg Shoulder Right  03/23/2014   CLINICAL DATA:  Fall.  EXAM: RIGHT SHOULDER - 2+ VIEW  COMPARISON:  None.  FINDINGS: There is no evidence of fracture or dislocation. There is no evidence of arthropathy or other focal bone abnormality. Soft tissues are unremarkable.  IMPRESSION: No acute fracture deformity or dislocation.   Electronically Signed   By: Elon Alas   On: 03/23/2014 22:43   Dg Elbow Complete Right  03/23/2014   CLINICAL DATA:  Right elbow pain after a fall.  EXAM: RIGHT ELBOW - COMPLETE 3+ VIEW  COMPARISON:  None.  FINDINGS: Degenerative changes in the right elbow. Old appearing ununited ossicle over the coronoid process. No displaced fractures identified. No significant effusion.  IMPRESSION: No acute bony abnormalities.   Electronically Signed   By: [name] M.D.   On: 03/23/2014 22:24   Dg Wrist Complete Right  03/23/2014   CLINICAL DATA:  Right wrist pain after a fall.  EXAM: RIGHT WRIST - COMPLETE 3+ VIEW   COMPARISON:  None.  FINDINGS: Diffuse bone demineralization. No evidence of acute fracture or dislocation in the right wrist. The soft tissues are unremarkable.  IMPRESSION: Negative.   Electronically Signed   By: [name] M.D.   On: 03/23/2014 22:24   Dg Tibia/fibula Right  03/24/2014   CLINICAL DATA:  Right tib-fib pain  EXAM: RIGHT TIBIA AND FIBULA - 2 VIEW  COMPARISON:  None.  FINDINGS: Moderate severe osteoarthritis involves the right knee. No acute fracture or subluxation identified. There is mild diffuse soft tissue swelling noted.  IMPRESSION: 1. No acute findings. 2. Osteoarthritis involves the right knee.   Electronically Signed   By: Kerby Moors M.D.   On: 03/24/2014 00:47   Dg Foot Complete Right  03/23/2014   CLINICAL DATA:  Trauma.  EXAM: RIGHT FOOT COMPLETE - 3+ VIEW  COMPARISON:  None.  FINDINGS: The bones appear osteopenic. There is no evidence of fracture or dislocation. Second through fifth hammertoe deformities are identified. There is no evidence of arthropathy or other focal bone abnormality. Soft tissues are unremarkable.  IMPRESSION: 1. No acute findings. 2. Hammertoe deformities.   Electronically Signed   By: Kerby Moors M.D.   On: 03/23/2014 22:37     EKG Interpretation None      MDM   Final diagnoses:  None   Medications  acetaminophen (TYLENOL) tablet 650 mg (not administered)   Filed Vitals:   03/23/14 2015 03/23/14 2030 03/23/14 2045 03/23/14 2100  BP: 144/67 128/45 146/68 132/59  Pulse: 69 75 73 73  Temp:      TempSrc:      Resp:    16  Height:      Weight:      SpO2: 98% 98% 98% 98%    Patient presenting to the ED after a fall that occurred this evening. Reported that his right leg gave out and that the patient fell with his right foot getting caught between the door way in to work. Reported right foot soreness. Most discomfort to the right shoulder-patient reported that the pain is localized from the shoulder down to the wrist of the  right side, stated that he has been having issues with his right shoulder since his fall back in Christmas of 2014. Denied head injury, loss of consciousness, dizziness, syncopal episode. Alert and oriented. GCS 15. Negative facial trauma-negative palpation hematomas. Negative upon palpation to the C-spine-neck supple full range of motion Heart rate and rhythm normal. Lungs clear to auscultation to upper and lower lobes bilaterally-negative signs of respiratory distress-patient stable to speak in full sentences without difficulty. Negative pain upon palpation to the chest wall. Radial and DP pulses 2+ bilaterally. Chronic leg edema bilaterally identified with 2+ pitting edema region from the dorsal aspects of the feet bilaterally to just below the knee bilaterally. Negative signs of cellulitic infection. Negative warmth upon palpation. Negative red streaks. Negative deformities identified to the right shoulder-negative tenting of the clavicle. Negative crepitus upon motion. Full rage of motion to the upper right extremity without difficulty. Discomfort upon palpation to the anterior aspect of the right shoulder. Full range of motion to the right ankle and digits of the right foot without difficulty. The station intact with differentiation sharp and dull touch. Strength intact with equal distribution-strength intact MCP, PIP, DIP joints of the right hand. Plain film of right foot negative acute osseous injury. Right shoulder pain film negative acute osseous injury. Plain film of  right elbow negative acute osseous injury. Plain film of right wrist negative acute osseous injury. Chest x-ray negative for acute cardiac pulmonary disease. Pelvis plain film negative for acute osseous injury. Plain film of right tib-fib negative for acute osseous injury, osteoarthritis identified in the right knee. Patient seen and assessed by attending physician who agreed with plain films. Plan to attempt to walk the patient. Patient  seen and assessed by attending physician - both this provider and attending physician reviewed imaging in great detail. Attending physician ambulated patient, as per attending physician patient ambulated well.  Negative acute osseous injuries identified to plain films. Patient able to ambulate without difficulty. Patient stable, afebrile. Patient not septic appearing. Negative findings of cellulitic infection noted to the lower tremors bilaterally-chronic edema noted. Patient has close followup. Patient seen and assessed by attending physician who cleared patient for discharge. Discharged patient. Discussed with patient to rest and stay hydrated. Discussed with patient to avoid any physical or strenuous activity. Referred patient to orthopedics and primary care provider to be reassessed within the next 24-48 hours. Discussed with patient to closely monitor symptoms and if symptoms are to worsen or change to report back to the ED - strict return instructions given.  Patient agreed to plan of care, understood, all questions answered.     Jamse Mead, PA-C 03/24/14 [id number]

## 2014-03-24 ENCOUNTER — Emergency Department (HOSPITAL_COMMUNITY): Payer: Medicare HMO

## 2014-03-24 MED ORDER — ACETAMINOPHEN 325 MG PO TABS
650.0000 mg | ORAL_TABLET | Freq: Once | ORAL | Status: AC
  Administered 2014-03-24: 650 mg via ORAL
  Filled 2014-03-24: qty 2

## 2014-03-24 NOTE — Discharge Instructions (Signed)
Please call your doctor for a followup appointment within 24-48 hours. When you talk to your doctor please let them know that you were seen in the emergency department and have them acquire all of your records so that they can discuss the findings with you and formulate a treatment plan to fully care for your new and ongoing problems. Please call and setup an appointment with your primary care provider to be reassessed within the next 24-48 hours Please call and set up appointment with orthopedics to be reassessed if pain continues Please avoid any physical or strenuous activity Can use Tylenol as needed for discomfort-no more than 4000 mg/4 g per day for this can lead to Tylenol overdose and liver failure. Please continue to monitor symptoms closely and if symptoms are to worsen or change (fever greater than 101, chills, nausea, vomiting, chest pain, shortness of breath, difficulty breathing, numbness, tingling, worsening pain, fall, headaches, dizziness, neck pain, neck stiffness, swelling to the legs that worsens, red streaks, warmth to the touch the legs) please report back to the ED immediately  Fall Prevention and Home Safety Falls cause injuries and can affect all age groups. It is possible to use preventive measures to significantly decrease the likelihood of falls. There are many simple measures which can make your home safer and prevent falls. OUTDOORS  Repair cracks and edges of walkways and driveways.  Remove high doorway thresholds.  Trim shrubbery on the main path into your home.  Have good outside lighting.  Clear walkways of tools, rocks, debris, and clutter.  Check that handrails are not broken and are securely fastened. Both sides of steps should have handrails.  Have leaves, snow, and ice cleared regularly.  Use sand or salt on walkways during winter months.  In the garage, clean up grease or oil spills. BATHROOM  Install night lights.  Install grab bars by the  toilet and in the tub and shower.  Use non-skid mats or decals in the tub or shower.  Place a plastic non-slip stool in the shower to sit on, if needed.  Keep floors dry and clean up all water on the floor immediately.  Remove soap buildup in the tub or shower on a regular basis.  Secure bath mats with non-slip, double-sided rug tape.  Remove throw rugs and tripping hazards from the floors. BEDROOMS  Install night lights.  Make sure a bedside light is easy to reach.  Do not use oversized bedding.  Keep a telephone by your bedside.  Have a firm chair with side arms to use for getting dressed.  Remove throw rugs and tripping hazards from the floor. KITCHEN  Keep handles on pots and pans turned toward the center of the stove. Use back burners when possible.  Clean up spills quickly and allow time for drying.  Avoid walking on wet floors.  Avoid hot utensils and knives.  Position shelves so they are not too high or low.  Place commonly used objects within easy reach.  If necessary, use a sturdy step stool with a grab bar when reaching.  Keep electrical cables out of the way.  Do not use floor polish or wax that makes floors slippery. If you must use wax, use non-skid floor wax.  Remove throw rugs and tripping hazards from the floor. STAIRWAYS  Never leave objects on stairs.  Place handrails on both sides of stairways and use them. Fix any loose handrails. Make sure handrails on both sides of the stairways are as long  as the stairs.  Check carpeting to make sure it is firmly attached along stairs. Make repairs to worn or loose carpet promptly.  Avoid placing throw rugs at the top or bottom of stairways, or properly secure the rug with carpet tape to prevent slippage. Get rid of throw rugs, if possible.  Have an electrician put in a light switch at the top and bottom of the stairs. OTHER FALL PREVENTION TIPS  Wear low-heel or rubber-soled shoes that are  supportive and fit well. Wear closed toe shoes.  When using a stepladder, make sure it is fully opened and both spreaders are firmly locked. Do not climb a closed stepladder.  Add color or contrast paint or tape to grab bars and handrails in your home. Place contrasting color strips on first and last steps.  Learn and use mobility aids as needed. Install an electrical emergency response system.  Turn on lights to avoid dark areas. Replace light bulbs that burn out immediately. Get light switches that glow.  Arrange furniture to create clear pathways. Keep furniture in the same place.  Firmly attach carpet with non-skid or double-sided tape.  Eliminate uneven floor surfaces.  Select a carpet pattern that does not visually hide the edge of steps.  Be aware of all pets. OTHER HOME SAFETY TIPS  Set the water temperature for 120 F (48.8 C).  Keep emergency numbers on or near the telephone.  Keep smoke detectors on every level of the home and near sleeping areas. Document Released: 11/19/2002 Document Revised: 05/30/2012 Document Reviewed: 02/18/2012 [facility] Patient Information 2014 [place].   Arthralgia Arthralgia is joint pain. A joint is a place where two bones meet. Joint pain can happen for many reasons. The joint can be bruised, stiff, infected, or weak from aging. Pain usually goes away after resting and taking medicine for soreness.  HOME CARE  Rest the joint as told by your doctor.  Keep the sore joint raised (elevated) for the first 24 hours.  Put ice on the joint area.  Put ice in a plastic bag.  Place a towel between your skin and the bag.  Leave the ice on for 15-20 minutes, 03-04 times a day.  Wear your splint, casting, elastic bandage, or sling as told by your doctor.  Only take medicine as told by your doctor. Do not take aspirin.  Use crutches as told by your doctor. Do not put weight on the joint until told to by your doctor. GET HELP RIGHT  AWAY IF:   You have bruising, puffiness (swelling), or more pain.  Your fingers or toes turn blue or start to lose feeling (numb).  Your medicine does not lessen the pain.  Your pain becomes severe.  You have a temperature by mouth above 102 F (38.9 C), not controlled by medicine.  You cannot move or use the joint. MAKE SURE YOU:   Understand these instructions.  Will watch your condition.  Will get help right away if you are not doing well or get worse. Document Released: 11/17/2009 Document Revised: 02/21/2012 Document Reviewed: 11/17/2009 [facility] Patient Information 2014 [place], [place].

## 2014-03-24 NOTE — ED Provider Notes (Signed)
Medical screening examination/treatment/procedure(s) were conducted as a shared visit with non-physician practitioner(s) and myself.  I personally evaluated the patient during the encounter.   EKG Interpretation None      Joshua Norton is a 80 y.o. male here with fall. He was walking to the mail box and his right foot got caught in the door way and he felt weak and fell on the floor. No head injury. Unable to get up afterwards. Denies syncope. Chronically ill appearing. Bilateral legs swollen (chronic), tender R ankle/foot. xrays showed no fracture. Able to bear weight with walker. Will d/c home.    Wandra Arthurs, MD 03/24/14 1504

## 2014-03-24 NOTE — ED Notes (Signed)
Pt came to the ED because he fell at home. L ankle pain. Moderate swelling to left leg. Pain currently 5 out of 10. CNS intact. Did not hit head or LOC during fall. Alert and oriented at this time. No cardiac or respiratory distress. Will continue to monitor.

## 2014-03-24 NOTE — ED Notes (Signed)
Call Granddaughter when PTAR leaves with PT @ [phone]

## 2014-03-24 NOTE — ED Notes (Signed)
[facility] picked up pt. No questions or concerns at this time. No cardiac or respiratory distress at this time. Discharge papers were given to granddaughter. No belongings in room.

## 2014-03-24 NOTE — ED Notes (Signed)
Called family to pick up pt. Pt cannot stand without assistance. Family stated they cannot take him home and requested PTAR.

## 2014-03-27 ENCOUNTER — Ambulatory Visit: Payer: Self-pay | Admitting: Physician Assistant

## 2014-03-28 ENCOUNTER — Ambulatory Visit (INDEPENDENT_AMBULATORY_CARE_PROVIDER_SITE_OTHER): Payer: Commercial Managed Care - HMO | Admitting: Physician Assistant

## 2014-03-28 VITALS — BP 122/78 | HR 60 | Temp 98.2°F | Resp 16 | Ht 67.0 in

## 2014-03-28 DIAGNOSIS — Z0000 Encounter for general adult medical examination without abnormal findings: Secondary | ICD-10-CM

## 2014-03-28 DIAGNOSIS — Z79899 Other long term (current) drug therapy: Secondary | ICD-10-CM

## 2014-03-28 DIAGNOSIS — L97419 Non-pressure chronic ulcer of right heel and midfoot with unspecified severity: Secondary | ICD-10-CM

## 2014-03-28 DIAGNOSIS — R296 Repeated falls: Secondary | ICD-10-CM

## 2014-03-28 DIAGNOSIS — I10 Essential (primary) hypertension: Secondary | ICD-10-CM

## 2014-03-28 DIAGNOSIS — Z1331 Encounter for screening for depression: Secondary | ICD-10-CM

## 2014-03-28 DIAGNOSIS — R0600 Dyspnea, unspecified: Secondary | ICD-10-CM

## 2014-03-28 DIAGNOSIS — R7303 Prediabetes: Secondary | ICD-10-CM

## 2014-03-28 DIAGNOSIS — E559 Vitamin D deficiency, unspecified: Secondary | ICD-10-CM

## 2014-03-28 DIAGNOSIS — E782 Mixed hyperlipidemia: Secondary | ICD-10-CM

## 2014-03-28 LAB — CBC WITH DIFFERENTIAL/PLATELET
BASOS PCT: 1 % (ref 0–1)
Basophils Absolute: 0 10*3/uL (ref 0.0–0.1)
Eosinophils Absolute: 0.1 10*3/uL (ref 0.0–0.7)
Eosinophils Relative: 3 % (ref 0–5)
HCT: 34.6 % — ABNORMAL LOW (ref 39.0–52.0)
Hemoglobin: 11.9 g/dL — ABNORMAL LOW (ref 13.0–17.0)
Lymphocytes Relative: 23 % (ref 12–46)
Lymphs Abs: 1.1 10*3/uL (ref 0.7–4.0)
MCH: 29.8 pg (ref 26.0–34.0)
MCHC: 34.4 g/dL (ref 30.0–36.0)
MCV: 86.5 fL (ref 78.0–100.0)
MONO ABS: 0.7 10*3/uL (ref 0.1–1.0)
Monocytes Relative: 15 % — ABNORMAL HIGH (ref 3–12)
NEUTROS PCT: 58 % (ref 43–77)
Neutro Abs: 2.7 10*3/uL (ref 1.7–7.7)
Platelets: 343 10*3/uL (ref 150–400)
RBC: 4 MIL/uL — ABNORMAL LOW (ref 4.22–5.81)
RDW: 13.2 % (ref 11.5–15.5)
WBC: 4.6 10*3/uL (ref 4.0–10.5)

## 2014-03-28 LAB — MAGNESIUM: MAGNESIUM: 2 mg/dL (ref 1.5–2.5)

## 2014-03-28 LAB — HEPATIC FUNCTION PANEL
ALT: 17 U/L (ref 0–53)
AST: 25 U/L (ref 0–37)
Albumin: 3.5 g/dL (ref 3.5–5.2)
Alkaline Phosphatase: 238 U/L — ABNORMAL HIGH (ref 39–117)
Bilirubin, Direct: 0.2 mg/dL (ref 0.0–0.3)
Indirect Bilirubin: 0.4 mg/dL (ref 0.2–1.2)
TOTAL PROTEIN: 6.9 g/dL (ref 6.0–8.3)
Total Bilirubin: 0.6 mg/dL (ref 0.2–1.2)

## 2014-03-28 LAB — TSH: TSH: 0.728 u[IU]/mL (ref 0.350–4.500)

## 2014-03-28 LAB — LIPID PANEL
CHOLESTEROL: 121 mg/dL (ref 0–200)
HDL: 44 mg/dL (ref >39)
LDL Cholesterol: 63 mg/dL (ref 0–99)
Total CHOL/HDL Ratio: 2.8 Ratio
Triglycerides: 71 mg/dL (ref <150)
VLDL: 14 mg/dL (ref 0–40)

## 2014-03-28 LAB — BASIC METABOLIC PANEL WITH GFR
BUN: 18 mg/dL (ref 6–23)
CALCIUM: 8.9 mg/dL (ref 8.4–10.5)
CO2: 26 mEq/L (ref 19–32)
CREATININE: 0.97 mg/dL (ref 0.50–1.35)
Chloride: 94 mEq/L — ABNORMAL LOW (ref 96–112)
GFR, EST AFRICAN AMERICAN: 78 mL/min
GFR, Est Non African American: 67 mL/min
GLUCOSE: 89 mg/dL (ref 70–99)
Potassium: 4.8 mEq/L (ref 3.5–5.3)
SODIUM: 127 meq/L — AB (ref 135–145)

## 2014-03-28 LAB — HEMOGLOBIN A1C
HEMOGLOBIN A1C: 6.2 % — AB (ref <5.7)
MEAN PLASMA GLUCOSE: 131 mg/dL — AB (ref <117)

## 2014-03-28 NOTE — Progress Notes (Signed)
Subjective:  Joshua Norton is a 80 y.o. male who presents for Medicare Annual Wellness Visit and 3 month follow up for HTN, hyperlipidemia, prediabetes, and vitamin D Def.  Date of last medicare wellness visit was is unknown.  His blood pressure has been controlled at home, today their BP is BP: 122/78 mmHg He does not workout. He denies chest pain, shortness of breath, dizziness.  He is on cholesterol medication and denies myalgias. His cholesterol is at goal. The cholesterol last visit was:   Lab Results  Component Value Date   CHOL 139 12/26/2013   HDL 52 12/26/2013   LDLCALC 72 12/26/2013   TRIG 77 12/26/2013   CHOLHDL 2.7 12/26/2013   He has not been working on diet and exercise for prediabetes, and denies polydipsia and polyuria. Last A1C in the office was:  Lab Results  Component Value Date   HGBA1C 6.3* 12/26/2013   Patient is on Vitamin D supplement.   He has fallen 2-3 times over the last 5 months. One time he was going out to his mailbox and walking back and his legs got weak and he fell. He is still at his house and lives alone.  He is out of a few of his medications.  He has 3+ edema bilateral legs, he is in a wheel chair right now and walks with walker at home.  Names of Other Physician/Practitioners you currently use: 1.  [facility] here for primary care 2. Dr. Einar Gip, eye doctor, last visit q 6 months Patient Care Team: Unk Pinto, MD as PCP - General (Internal Medicine)  Medication Review: Current Outpatient Prescriptions on File Prior to Visit  Medication Sig Dispense Refill  . amLODipine (NORVASC) 5 MG tablet Take 5 mg by mouth daily.      . AZOPT 1 % ophthalmic suspension Place 1 drop into the right eye daily.      . cloNIDine (CATAPRES) 0.1 MG tablet Take 0.1 mg by mouth daily.      . Cyanocobalamin (VITAMIN B-12 PO) Take 1 tablet by mouth daily.      Marland Kitchen dutasteride (AVODART) 0.5 MG capsule Take 0.5 mg by mouth daily.       . fexofenadine (ALLEGRA) 180 MG tablet Take 180 mg by mouth daily.      . finasteride (PROSCAR) 5 MG tablet Take 5 mg by mouth daily.      . furosemide (LASIX) 40 MG tablet Take 40 mg by mouth daily.      . isosorbide mononitrate (IMDUR) 120 MG 24 hr tablet Take 1 tablet (120 mg total) by mouth daily.  90 tablet  1  . LUMIGAN 0.01 % SOLN Place 1 drop into both eyes at bedtime.       . meloxicam (MOBIC) 15 MG tablet Take 1 tablet (15 mg total) by mouth daily.  90 tablet  1  . metolazone (ZAROXOLYN) 2.5 MG tablet Take 2.5 mg by mouth every other day.      . metoprolol succinate (TOPROL-XL) 50 MG 24 hr tablet Take 1 tablet (50 mg total) by mouth daily. Take with or immediately following a meal.  90 tablet  3  . pantoprazole (PROTONIX) 20 MG tablet Take 20 mg by mouth daily.      . quinapril (ACCUPRIL) 40 MG tablet Take 40 mg by mouth at bedtime.      Marland Kitchen VITAMIN D, ERGOCALCIFEROL, PO Take 2 tablets by mouth daily.        No current  facility-administered medications on file prior to visit.    Current Problems (verified) Patient Active Problem List   Diagnosis Date Noted  . Mixed hyperlipidemia 12/26/2013  . Prediabetes   . Anemia   . Vitamin D deficiency   . Glaucoma   . Bradycardia 08/23/2013  . Congestive dilated cardiomyopathy- EF 40-45% 2D 6/13 04/24/2013  . PVC's (premature ventricular contractions) 04/24/2013  . HTN (hypertension), benign 06/07/2012    Class: Diagnosis of  . Osteoarthritis (arthritis due to wear and tear of joints)  06/07/2012    Screening Tests Health Maintenance  Topic Date Due  . Zostavax  12/08/1981  . Colonoscopy  12/13/2009  . Influenza Vaccine  07/13/2014  . Tetanus/tdap  11/14/2022  . Pneumococcal Polysaccharide Vaccine Age 16 And Over  Completed    Immunization History  Administered Date(s) Administered  . Influenza Split 10/02/2013  . Pneumococcal Polysaccharide-23 11/14/2012  . Tdap 11/14/2012    Preventative care: Last colonoscopy:  2001- DECLINES ANOTHER  Prior vaccinations: TD or Tdap: 2013  Influenza: 2014  Pneumococcal: 2013 Shingles/Zostavax: declines  History reviewed: allergies, current medications, past family history, past medical history, past social history, past surgical history and problem list   Risk Factors: Tobacco History  Substance Use Topics  . Smoking status: Never Smoker   . Smokeless tobacco: Never Used  . Alcohol Use: No   He does not smoke.  Patient is not a former smoker. Are there smokers in your home (other than you)?  No  Alcohol Current alcohol use: none  Caffeine Current caffeine use: coffee 1-2 /day  Exercise Current exercise habits: The patient does not participate in regular exercise at present.  Current exercise: none  Nutrition/Diet Current diet: in general, an "unhealthy" diet  Cardiac risk factors: advanced age (older than 61 for men, 32 for women), diabetes mellitus, dyslipidemia, family history of premature cardiovascular disease, hypertension, male gender, microalbuminuria, obesity (BMI >= 30 kg/m2) and sedentary lifestyle.  Depression Screen (Note: if answer to either of the following is "Yes", a more complete depression screening is indicated)   Q1: Over the past two weeks, have you felt down, depressed or hopeless? No  Q2: Over the past two weeks, have you felt little interest or pleasure in doing things? No  Have you lost interest or pleasure in daily life? No  Do you often feel hopeless? No  Do you cry easily over simple problems? No  Activities of Daily Living In your present state of health, do you have any difficulty performing the following activities?:  Driving? Yes Managing money?  Yes Feeding yourself? No Getting from bed to chair? No Climbing a flight of stairs? Yes Preparing food and eating?: Yes Bathing or showering? Yes Getting dressed: No Getting to the toilet? Yes Using the toilet:No Moving around from place to place: Yes In the  past year have you fallen or had a near fall?:Yes   Are you sexually active?  No  Do you have more than one partner?  No  Vision Difficulties: Yes  Hearing Difficulties: Yes Do you often ask people to speak up or repeat themselves? Yes Do you experience ringing or noises in your ears? No Do you have difficulty understanding soft or whispered voices? Yes  Cognition  Do you feel that you have a problem with memory?Yes  Do you often misplace items? Yes  Do you feel safe at home?  Yes  Advanced directives Does patient have a [place]? Yes Does patient have a  Living Will? Yes   Objective:   Blood pressure 122/78, pulse 60, temperature 98.2 F (36.8 C), resp. rate 16, height 5\' 7"  (1.702 m). There is no weight on file to calculate BMI.  General appearance: alert, no distress, WD/WN, male Cognitive Testing  Alert? Yes  Normal Appearance?Yes  Oriented to person? Yes  Place? Yes   Time? Yes  Recall of three objects?  No  Can perform simple calculations? No  Displays appropriate judgment?Yes  Can read the correct time from a watch face?No  General appearance: cooperative and moderately obese Head: Normocephalic, without obvious abnormality, atraumatic Eyes: conjunctivae/corneas clear. PERRL, EOM's intact. Fundi benign. Ears: normal TM's and external ear canals both ears Neck: no adenopathy, no carotid bruit, no JVD, supple, symmetrical, trachea midline and thyroid not enlarged, symmetric, no tenderness/mass/nodules Lungs: diminished breath sounds diffuse and wheezes bibasilar Heart: regular rate and rhythm and systolic murmur: holosystolic 3/6, blowing at 2nd left intercostal space Abdomen: soft, non-tender; bowel sounds normal; no masses,  no organomegaly and obses Extremities: edema 3+ pitting , no edema, redness or tenderness in the calves or thighs and right heel ulcer, no erythema, non tender, likely stage 1-2 Pulses: decreased bilateral pulses in feet  secondary to edema Skin: Skin color, texture, turgor normal. No rashes or lesions Neurologic: Mental status: alertness: alert, orientation: time, date, person, place Sensory: sensation decreased bilateral feet to his ankles Motor: diffusely decreased Gait: in wheel chair   Assessment:   1. HTN (hypertension), benign  2. Prediabetes Discussed general issues about diabetes pathophysiology and management., Educational material distributed., Suggested low cholesterol diet., Encouraged aerobic exercise., Discussed foot care., Reminded to get yearly retinal exam. - Hemoglobin A1c - HM DIABETES FOOT EXAM  3. Vitamin D deficiency  4. Mixed hyperlipidemia - Lipid panel  5. Falls frequently Patient lives alone, frequent falls, decreased sensation bilateral feet, with ulcer on right heel and 3+ edema- very high risk- difficult to leave the house due to these conditions. We will send out home health for  - Ambulatory referral to [place]  6. Ulcer of right heel Wound assessment - Ambulatory referral to [facility]  7. Dyspnea Bilateral edema, will check BNP? If from inactivity vs CHF - Brain natriuretic peptide [id number]  8. Encounter for long-term (current) use of other medications - Magnesium   Plan:   During the course of the visit the patient was educated and counseled about appropriate screening and preventive services including:    Pneumococcal vaccine   Influenza vaccine  Td vaccine  Screening electrocardiogram  Colorectal cancer screening  Diabetes screening  Glaucoma screening  Nutrition counseling   Screening recommendations, referrals: Vaccinations: Tdap vaccine not indicated Influenza vaccine not indicated Pneumococcal vaccine not indicated Shingles vaccine not indicated Hep B vaccine not indicated  Nutrition assessed and recommended  Colonoscopy not indicated Recommended yearly ophthalmology/optometry visit for glaucoma screening and  checkup Recommended yearly dental visit for hygiene and checkup Advanced directives - requested  Conditions/risks identified: BMI: Discussed weight loss, diet, and increase physical activity.  Increase physical activity: AHA recommends 150 minutes of physical activity a week.  Medications reviewed Diabetes is at goal, ACE/ARB therapy: Yes. Urinary Incontinence is an issue: discussed non pharmacology and pharmacology options.  Fall risk: high- discussed PT, home fall assessment, medications.    Medicare Attestation I have personally reviewed: The patient's medical and social history Their use of alcohol, tobacco or illicit drugs Their current medications and supplements The patient's functional ability including ADLs,fall risks, home safety  risks, cognitive, and hearing and visual impairment Diet and physical activities Evidence for depression or mood disorders  The patient's weight, height, BMI, and visual acuity have been recorded in the chart.  I have made referrals, counseling, and provided education to the patient based on review of the above and I have provided the patient with a written personalized care plan for preventive services.     Vicie Mutters, PA-C   03/28/2014

## 2014-03-28 NOTE — Patient Instructions (Signed)
Preventative Care for Adults, Male       REGULAR HEALTH EXAMS:  A routine yearly physical is a good way to check in with your primary care provider about your health and preventive screening. It is also an opportunity to share updates about your health and any concerns you have, and receive a thorough all-over exam.   Most health insurance companies pay for at least some preventative services.  Check with your health plan for specific coverages.  WHAT PREVENTATIVE SERVICES DO MEN NEED?  Adult men should have their weight and blood pressure checked regularly.   Men age 35 and older should have their cholesterol levels checked regularly.  Beginning at age 50 and continuing to age 75, men should be screened for colorectal cancer.  Certain people should may need continued testing until age 85.  Other cancer screening may include exams for testicular and prostate cancer.  Updating vaccinations is part of preventative care.  Vaccinations help protect against diseases such as the flu.  Lab tests are generally done as part of preventative care to screen for anemia and blood disorders, to screen for problems with the kidneys and liver, to screen for bladder problems, to check blood sugar, and to check your cholesterol level.  Preventative services generally include counseling about diet, exercise, avoiding tobacco, drugs, excessive alcohol consumption, and sexually transmitted infections.    GENERAL RECOMMENDATIONS FOR GOOD HEALTH:  Healthy diet:  Eat a variety of foods, including fruit, vegetables, animal or vegetable protein, such as meat, fish, chicken, and eggs, or beans, lentils, tofu, and grains, such as rice.  Drink plenty of water daily.  Decrease saturated fat in the diet, avoid lots of red meat, processed foods, sweets, fast foods, and fried foods.  Exercise:  Aerobic exercise helps maintain good heart health. At least 30-40 minutes of moderate-intensity exercise is recommended.  For example, a brisk walk that increases your heart rate and breathing. This should be done on most days of the week.   Find a type of exercise or a variety of exercises that you enjoy so that it becomes a part of your daily life.  Examples are running, walking, swimming, water aerobics, and biking.  For motivation and support, explore group exercise such as aerobic class, spin class, Zumba, Yoga,or  martial arts, etc.    Set exercise goals for yourself, such as a certain weight goal, walk or run in a race such as a 5k walk/run.  Speak to your primary care provider about exercise goals.  Disease prevention:  If you smoke or chew tobacco, find out from your caregiver how to quit. It can literally save your life, no matter how long you have been a tobacco user. If you do not use tobacco, never begin.   Maintain a healthy diet and normal weight. Increased weight leads to problems with blood pressure and diabetes.   The Body Mass Index or BMI is a way of measuring how much of your body is fat. Having a BMI above 27 increases the risk of heart disease, diabetes, hypertension, stroke and other problems related to obesity. Your caregiver can help determine your BMI and based on it develop an exercise and dietary program to help you achieve or maintain this important measurement at a healthful level.  High blood pressure causes heart and blood vessel problems.  Persistent high blood pressure should be treated with medicine if weight loss and exercise do not work.   Fat and cholesterol leaves deposits in your arteries   that can block them. This causes heart disease and vessel disease elsewhere in your body.  If your cholesterol is found to be high, or if you have heart disease or certain other medical conditions, then you may need to have your cholesterol monitored frequently and be treated with medication.   Ask if you should have a stress test if your history suggests this. A stress test is a test done on  a treadmill that looks for heart disease. This test can find disease prior to there being a problem.  Avoid drinking alcohol in excess (more than two drinks per day).  Avoid use of street drugs. Do not share needles with anyone. Ask for professional help if you need assistance or instructions on stopping the use of alcohol, cigarettes, and/or drugs.  Brush your teeth twice a day with fluoride toothpaste, and floss once a day. Good oral hygiene prevents tooth decay and gum disease. The problems can be painful, unattractive, and can cause other health problems. Visit your dentist for a routine oral and dental check up and preventive care every 6-12 months.   Look at your skin regularly.  Use a mirror to look at your back. Notify your caregivers of changes in moles, especially if there are changes in shapes, colors, a size larger than a pencil eraser, an irregular border, or development of new moles.  Safety:  Use seatbelts 100% of the time, whether driving or as a passenger.  Use safety devices such as hearing protection if you work in environments with loud noise or significant background noise.  Use safety glasses when doing any work that could send debris in to the eyes.  Use a helmet if you ride a bike or motorcycle.  Use appropriate safety gear for contact sports.  Talk to your caregiver about gun safety.  Use sunscreen with a SPF (or skin protection factor) of 15 or greater.  Lighter skinned people are at a greater risk of skin cancer. Don't forget to also wear sunglasses in order to protect your eyes from too much damaging sunlight. Damaging sunlight can accelerate cataract formation.   Practice safe sex. Use condoms. Condoms are used for birth control and to help reduce the spread of sexually transmitted infections (or STIs).  Some of the STIs are gonorrhea (the clap), chlamydia, syphilis, trichomonas, herpes, HPV (human papilloma virus) and HIV (human immunodeficiency virus) which causes AIDS.  The herpes, HIV and HPV are viral illnesses that have no cure. These can result in disability, cancer and death.   Keep carbon monoxide and smoke detectors in your home functioning at all times. Change the batteries every 6 months or use a model that plugs into the wall.   Vaccinations:  Stay up to date with your tetanus shots and other required immunizations. You should have a booster for tetanus every 10 years. Be sure to get your flu shot every year, since 5%-20% of the [place] population comes down with the flu. The flu vaccine changes each year, so being vaccinated once is not enough. Get your shot in the fall, before the flu season peaks.   Other vaccines to consider:  Pneumococcal vaccine to protect against certain types of pneumonia.  This is normally recommended for adults age 65 or older.  However, adults younger than 78 years old with certain underlying conditions such as diabetes, heart or lung disease should also receive the vaccine.  Shingles vaccine to protect against Varicella Zoster if you are older than age 60, or younger   than 78 years old with certain underlying illness.  Hepatitis A vaccine to protect against a form of infection of the liver by a virus acquired from food.  Hepatitis B vaccine to protect against a form of infection of the liver by a virus acquired from blood or body fluids, particularly if you work in health care.  If you plan to travel internationally, check with your local health department for specific vaccination recommendations.  Cancer Screening:  Most routine colon cancer screening begins at the age of 50. On a yearly basis, doctors may provide special easy to use take-home tests to check for hidden blood in the stool. Sigmoidoscopy or colonoscopy can detect the earliest forms of colon cancer and is life saving. These tests use a small camera at the end of a tube to directly examine the colon. Speak to your caregiver about this at age 50, when routine  screening begins (and is repeated every 5 years unless early forms of pre-cancerous polyps or small growths are found).   At the age of 50 men usually start screening for prostate cancer every year. Screening may begin at a younger age for those with higher risk. Those at higher risk include African-Americans or having a family history of prostate cancer. There are two types of tests for prostate cancer:   Prostate-specific antigen (PSA) testing. Recent studies raise questions about prostate cancer using PSA and you should discuss this with your caregiver.   Digital rectal exam (in which your doctor's lubricated and gloved finger feels for enlargement of the prostate through the anus).   Screening for testicular cancer.  Do a monthly exam of your testicles. Gently roll each testicle between your thumb and fingers, feeling for any abnormal lumps. The best time to do this is after a hot shower or bath when the tissues are looser. Notify your caregivers of any lumps, tenderness or changes in size or shape immediately.     

## 2014-03-29 LAB — BRAIN NATRIURETIC PEPTIDE: Brain Natriuretic Peptide: 115.4 pg/mL — ABNORMAL HIGH (ref 0.0–100.0)

## 2014-04-09 ENCOUNTER — Ambulatory Visit: Payer: Self-pay | Admitting: Physician Assistant

## 2014-04-09 ENCOUNTER — Telehealth: Payer: Self-pay

## 2014-04-09 NOTE — Telephone Encounter (Signed)
Spoke with Gilmore Laroche at [facility], [phone], she is helping to care for patient and she was asking for orders for social/home worker, orders for unna boots or surepress compression may be best per the physical therapist, Vicie Mutters, PA gave a verbal order for all requested and also requested that blood draw of BMP, CBD and LFT be done, Gilmore Laroche states if not done today she will have done next visit as stat which would be Thursday 04-11-14.

## 2014-04-15 ENCOUNTER — Other Ambulatory Visit: Payer: Self-pay | Admitting: *Deleted

## 2014-04-15 MED ORDER — DUTASTERIDE 0.5 MG PO CAPS: 0.5000 mg | ORAL_CAPSULE | Freq: Every day | ORAL | Status: DC

## 2014-04-15 MED ORDER — METOPROLOL SUCCINATE ER 50 MG PO TB24: 50.0000 mg | ORAL_TABLET | Freq: Every day | ORAL | Status: DC

## 2014-04-15 MED ORDER — FINASTERIDE 5 MG PO TABS: 5.0000 mg | ORAL_TABLET | Freq: Every day | ORAL | Status: DC

## 2014-04-25 ENCOUNTER — Other Ambulatory Visit: Payer: Self-pay | Admitting: Internal Medicine

## 2014-05-02 ENCOUNTER — Telehealth: Payer: Self-pay | Admitting: *Deleted

## 2014-05-02 NOTE — Telephone Encounter (Signed)
Spoke with Joshua Norton from [facility] regarding low BP of 90/70 and 85/65 but Joshua Norton in no distress.  Advised Joshua Norton increase fluid and increase salt intake.   Per Dr [name] stop Furosemide and metalozone due to possible dehydration and come for OV tomorrow to check labs.  Joshua Norton aware and appointment scheduled.

## 2014-05-03 ENCOUNTER — Ambulatory Visit (INDEPENDENT_AMBULATORY_CARE_PROVIDER_SITE_OTHER): Payer: Commercial Managed Care - HMO | Admitting: Internal Medicine

## 2014-05-03 ENCOUNTER — Encounter: Payer: Self-pay | Admitting: Internal Medicine

## 2014-05-03 VITALS — BP 114/68 | HR 64 | Temp 98.2°F | Resp 16

## 2014-05-03 DIAGNOSIS — R531 Weakness: Secondary | ICD-10-CM

## 2014-05-03 DIAGNOSIS — I10 Essential (primary) hypertension: Secondary | ICD-10-CM

## 2014-05-03 DIAGNOSIS — Z79899 Other long term (current) drug therapy: Secondary | ICD-10-CM

## 2014-05-03 DIAGNOSIS — R5383 Other fatigue: Secondary | ICD-10-CM

## 2014-05-03 DIAGNOSIS — R5381 Other malaise: Secondary | ICD-10-CM

## 2014-05-03 LAB — CBC WITH DIFFERENTIAL/PLATELET
Basophils Absolute: 0 10*3/uL (ref 0.0–0.1)
Basophils Relative: 1 % (ref 0–1)
EOS ABS: 0 10*3/uL (ref 0.0–0.7)
Eosinophils Relative: 1 % (ref 0–5)
HCT: 34.3 % — ABNORMAL LOW (ref 39.0–52.0)
HEMOGLOBIN: 12.2 g/dL — AB (ref 13.0–17.0)
Lymphocytes Relative: 24 % (ref 12–46)
Lymphs Abs: 1.1 10*3/uL (ref 0.7–4.0)
MCH: 29.8 pg (ref 26.0–34.0)
MCHC: 35.6 g/dL (ref 30.0–36.0)
MCV: 83.9 fL (ref 78.0–100.0)
MONOS PCT: 14 % — AB (ref 3–12)
Monocytes Absolute: 0.6 10*3/uL (ref 0.1–1.0)
NEUTROS ABS: 2.7 10*3/uL (ref 1.7–7.7)
NEUTROS PCT: 60 % (ref 43–77)
PLATELETS: 431 10*3/uL — AB (ref 150–400)
RBC: 4.09 MIL/uL — ABNORMAL LOW (ref 4.22–5.81)
RDW: 13.2 % (ref 11.5–15.5)
WBC: 4.5 10*3/uL (ref 4.0–10.5)

## 2014-05-03 NOTE — Patient Instructions (Signed)
Leave off your fluid pills  Lasix (furosemide) & Metolazone(Zaroxolyn)  Until we get you blood tests back  =================================================================  In the meantime drink lotd of fluidsDehydration, Elderly Dehydration is when you lose more fluids from the body than you take in. Vital organs such as the kidneys, brain, and heart cannot function without a proper amount of fluids and salt. Any loss of fluids from the body can cause dehydration.  Older adults are at a higher risk of dehydration than younger adults. As we age, our bodies are less able to conserve water and do not respond to temperature changes as well. Also, older adults do not become thirsty as easily or quickly. Because of this, older adults often do not realize they need to increase fluids to avoid dehydration.  CAUSES   Vomiting.  Diarrhea.  Excessive sweating.  Excessive urination.  Fever.  Certain medicines, such as blood pressure medicines called diuretics.  Poorly controlled blood sugars. SIGNS AND SYMPTOMS  Mild dehydration:  Thirst.  Dry lips.  Slightly dry mouth. Moderate dehydration:  Very dry mouth.  Sunken eyes.  Skin does not bounce back quickly when lightly pinched and released.  Dark urine and decreased urine production.  Decreased tear production.  Headache. Severe dehydration:  Very dry mouth.  Extreme thirst.  Rapid, weak pulse (more than 100 beats per minute at rest).  Cold hands and feet.  Not able to sweat in spite of heat.  Rapid breathing.  Blue lips.  Confusion and lethargy.  Difficulty being awakened.  Minimal urine production.  No tears. DIAGNOSIS  Your health care provider will diagnose dehydration based on your symptoms and your exam. Blood and urine tests will help confirm the diagnosis. The diagnostic evaluation should also identify the cause of dehydration. TREATMENT  Treatment of mild or moderate dehydration can often be  done at home by increasing the amount of fluids that you drink. It is best to drink small amounts of fluid more often. Drinking too much at one time can make vomiting worse. Severe dehydration needs to be treated at the hospital. You may be given IV fluids that contain water and electrolytes. HOME CARE INSTRUCTIONS   Ask your health care provider about specific rehydration instructions.  Drink enough fluids to keep your urine clear or pale yellow.  Drink small amounts frequently if you have nausea and vomiting.  Eat as you normally do.  Avoid:  Foods or drinks high in sugar.  Carbonated drinks.  Juice.  Extremely hot or cold fluids.  Drinks with caffeine.  Fatty, greasy foods.  Alcohol.  Tobacco.  Overeating.  Gelatin desserts.  Wash your hands well to avoid spreading bacteria and viruses.  Only take over-the-counter or prescription medicines for pain, discomfort, or fever as directed by your health care provider.  Ask your health care provider if you should continue all prescribed and over-the-counter medicines.  Keep all follow-up appointments with your health care provider. SEEK MEDICAL CARE IF:  You have abdominal pain, and it increases or stays in one area (localizes).  You have a rash, stiff neck, or severe headache.  You are irritable, sleepy, or difficult to awaken.  You are weak, dizzy, or extremely thirsty. SEEK IMMEDIATE MEDICAL CARE IF:   You are unable to keep fluids down, or you get worse despite treatment.  You have frequent episodes of vomiting or diarrhea.  You have blood or green matter (bile) in your vomit.  You have blood in your stool, or your stool looks black  and tarry.  You have not urinated in 6 8 hours, or you have only urinated a small amount of very dark urine.  You have a fever.  You faint. MAKE SURE YOU:   Understand these instructions.  Will watch your condition.  Will get help right away if you are not doing well or  get worse. Document Released: 02/19/2004 Document Revised: 09/19/2013 Document Reviewed: 08/06/2013 [facility] Patient Information 2014 [place].

## 2014-05-03 NOTE — Progress Notes (Signed)
Subjective:    Patient ID: Joshua Norton, male    DOB: May 08, 1919, 80 y.o.   MRN: [id number]  HPI This very nice 80 yo WBM with multiple medical problems presents today as the day before a home health nurse found the patient to be having Sys BP's 85-90 and he was completely asymptomatic. Instructions were to increase fluids and stop his diuretics.    Medication List       amLODipine 5 MG tablet  Commonly known as:  NORVASC  Take 5 mg by mouth daily.     AZOPT 1 % ophthalmic suspension  Generic drug:  brinzolamide  Place 1 drop into the right eye daily.     cloNIDine 0.1 MG tablet  Commonly known as:  CATAPRES  Take 0.1 mg by mouth daily.     fexofenadine 180 MG tablet  Commonly known as:  ALLEGRA  Take 180 mg by mouth daily.     finasteride 5 MG tablet  Commonly known as:  PROSCAR  Take 1 tablet (5 mg total) by mouth daily.     furosemide 40 MG tablet ---------------------------------HOLDING DOSE  Commonly known as:  LASIX  Take 40 mg by mouth daily.     isosorbide mononitrate 120 MG 24 hr tablet  Commonly known as:  IMDUR  Take 1 tablet (120 mg total) by mouth daily.     LUMIGAN 0.01 % Soln  Generic drug:  bimatoprost  Place 1 drop into both eyes at bedtime.     meloxicam 15 MG tablet  Commonly known as:  MOBIC  Take 1 tablet (15 mg total) by mouth daily.     metolazone 2.5 MG tablet-----------------------------------HOLDING DOSE  Commonly known as:  ZAROXOLYN  Take 2.5 mg by mouth every other day.     metoprolol succinate 50 MG 24 hr tablet  Commonly known as:  TOPROL-XL  Take 1 tablet (50 mg total) by mouth daily. Take with or immediately following a meal.     pantoprazole 20 MG tablet  Commonly known as:  PROTONIX  Take 20 mg by mouth daily.     quinapril 40 MG tablet  Commonly known as:  ACCUPRIL  Take 40 mg by mouth at bedtime.     VITAMIN B-12 PO  Take 1 tablet by mouth daily.     VITAMIN D (ERGOCALCIFEROL) PO  Take 2 tablets by mouth daily.     Allergies  Allergen Reactions  . Doxazosin Swelling   Past Medical History  Diagnosis Date  . Edema of both legs 06/09/2012    2D Echo - EF 40-45, left ventricle cavity moderately dilated  . HTN (hypertension), benign     On multiple medications  . Osteoarthritis (arthritis due to wear and tear of joints)     S/p L Knee Arthroplasty  . Diastolic dysfunction     Chronic  . Cardiomyopathy     EF 40-45% 2D 6/13  . Prediabetes   . Hyperlipidemia   . Anemia   . Vitamin D deficiency   . Glaucoma    Review of Systems  In addition to the HPI above,  No Fever-chills,  No Headache, No changes with Vision or hearing,  No problems swallowing food or Liquids,  No Chest pain or productive Cough or Shortness of Breath,  No Abdominal pain, No Nausea or Vommitting, Bowel movements are regular,  No Blood in stool or Urine,  No dysuria,  No new skin rashes or bruises,  No new joints pains-aches,  No  new weakness, tingling, numbness in any extremity,  No recent weight loss,  No polyuria, polydypsia or polyphagia,  A full 10 point Review of Systems was done, except as stated above, all other Review of Systems were negative  Objective:   Physical Exam  BP 114/68  Pulse 64  Temp(Src) 98.2 F (36.8 C) (Temporal)  Resp 16  HEENT - Eac's patent. TM's Nl.EOM's full. PERRLA. NasoOroPharynx clear. Neck - supple. Nl Thyroid. No bruits nodes JVD Chest - Clear equal BS Cor - Nl HS. RRR w/o sig MGR. PP obscured by  1(+)  edema. Abd - No palpable organomegaly, masses or tenderness. BS nl. MS- FROM. w/o deformities. Muscle power tone and bulk consistent w/age. Gait not tested Neuro - No obvious Cr N abnormalities. Sensory, motor and Cerebellar functions appear Nl w/o focal abnormalities.  Assessment & Plan:   1. HTN (hypertension), benign  2. Weakness  - CBC with Differential - BASIC METABOLIC PANEL WITH GFR  Disposition pending lab results.

## 2014-05-04 LAB — BASIC METABOLIC PANEL WITH GFR
BUN: 40 mg/dL — ABNORMAL HIGH (ref 6–23)
CALCIUM: 8.6 mg/dL (ref 8.4–10.5)
CO2: 29 mEq/L (ref 19–32)
Chloride: 85 mEq/L — ABNORMAL LOW (ref 96–112)
Creat: 1.35 mg/dL (ref 0.50–1.35)
GFR, EST AFRICAN AMERICAN: 52 mL/min — AB
GFR, Est Non African American: 45 mL/min — ABNORMAL LOW
GLUCOSE: 96 mg/dL (ref 70–99)
POTASSIUM: 3.9 meq/L (ref 3.5–5.3)
SODIUM: 123 meq/L — AB (ref 135–145)

## 2014-05-09 ENCOUNTER — Encounter: Payer: Self-pay | Admitting: Internal Medicine

## 2014-05-09 ENCOUNTER — Ambulatory Visit: Payer: Self-pay

## 2014-05-16 ENCOUNTER — Ambulatory Visit: Payer: Self-pay | Admitting: Emergency Medicine

## 2014-05-16 ENCOUNTER — Encounter (HOSPITAL_COMMUNITY): Payer: Self-pay | Admitting: Emergency Medicine

## 2014-05-16 ENCOUNTER — Emergency Department (HOSPITAL_COMMUNITY): Payer: Medicare HMO

## 2014-05-16 ENCOUNTER — Inpatient Hospital Stay (HOSPITAL_COMMUNITY): Payer: Medicare HMO

## 2014-05-16 ENCOUNTER — Inpatient Hospital Stay (HOSPITAL_COMMUNITY)
Admission: EM | Admit: 2014-05-16 | Discharge: 2014-05-20 | DRG: 683 | Disposition: A | Discharge status: Skilled Nursing Facility | Payer: Medicare HMO | Attending: Internal Medicine | Admitting: Internal Medicine

## 2014-05-16 DIAGNOSIS — R5383 Other fatigue: Secondary | ICD-10-CM

## 2014-05-16 DIAGNOSIS — I428 Other cardiomyopathies: Secondary | ICD-10-CM

## 2014-05-16 DIAGNOSIS — R748 Abnormal levels of other serum enzymes: Secondary | ICD-10-CM

## 2014-05-16 DIAGNOSIS — H409 Unspecified glaucoma: Secondary | ICD-10-CM | POA: Diagnosis present

## 2014-05-16 DIAGNOSIS — N179 Acute kidney failure, unspecified: Principal | ICD-10-CM

## 2014-05-16 DIAGNOSIS — K219 Gastro-esophageal reflux disease without esophagitis: Secondary | ICD-10-CM | POA: Diagnosis present

## 2014-05-16 DIAGNOSIS — R7303 Prediabetes: Secondary | ICD-10-CM

## 2014-05-16 DIAGNOSIS — I10 Essential (primary) hypertension: Secondary | ICD-10-CM | POA: Diagnosis present

## 2014-05-16 DIAGNOSIS — Z79899 Other long term (current) drug therapy: Secondary | ICD-10-CM

## 2014-05-16 DIAGNOSIS — Z7982 Long term (current) use of aspirin: Secondary | ICD-10-CM

## 2014-05-16 DIAGNOSIS — E872 Acidosis, unspecified: Secondary | ICD-10-CM | POA: Diagnosis present

## 2014-05-16 DIAGNOSIS — E871 Hypo-osmolality and hyponatremia: Secondary | ICD-10-CM

## 2014-05-16 DIAGNOSIS — I493 Ventricular premature depolarization: Secondary | ICD-10-CM

## 2014-05-16 DIAGNOSIS — I420 Dilated cardiomyopathy: Secondary | ICD-10-CM

## 2014-05-16 DIAGNOSIS — R001 Bradycardia, unspecified: Secondary | ICD-10-CM

## 2014-05-16 DIAGNOSIS — E785 Hyperlipidemia, unspecified: Secondary | ICD-10-CM | POA: Diagnosis present

## 2014-05-16 DIAGNOSIS — M1990 Unspecified osteoarthritis, unspecified site: Secondary | ICD-10-CM | POA: Diagnosis present

## 2014-05-16 DIAGNOSIS — Z9849 Cataract extraction status, unspecified eye: Secondary | ICD-10-CM

## 2014-05-16 DIAGNOSIS — E559 Vitamin D deficiency, unspecified: Secondary | ICD-10-CM

## 2014-05-16 DIAGNOSIS — R5381 Other malaise: Secondary | ICD-10-CM | POA: Diagnosis present

## 2014-05-16 DIAGNOSIS — I509 Heart failure, unspecified: Secondary | ICD-10-CM | POA: Diagnosis present

## 2014-05-16 DIAGNOSIS — I959 Hypotension, unspecified: Secondary | ICD-10-CM

## 2014-05-16 DIAGNOSIS — I5022 Chronic systolic (congestive) heart failure: Secondary | ICD-10-CM | POA: Diagnosis present

## 2014-05-16 DIAGNOSIS — D649 Anemia, unspecified: Secondary | ICD-10-CM

## 2014-05-16 DIAGNOSIS — E782 Mixed hyperlipidemia: Secondary | ICD-10-CM | POA: Diagnosis present

## 2014-05-16 LAB — URINALYSIS, ROUTINE W REFLEX MICROSCOPIC
Glucose, UA: NEGATIVE mg/dL
Ketones, ur: NEGATIVE mg/dL
Nitrite: NEGATIVE
PROTEIN: NEGATIVE mg/dL
Specific Gravity, Urine: 1.019 (ref 1.005–1.030)
UROBILINOGEN UA: 1 mg/dL (ref 0.0–1.0)
pH: 5.5 (ref 5.0–8.0)

## 2014-05-16 LAB — CBC
HEMATOCRIT: 32.3 % — AB (ref 39.0–52.0)
HEMOGLOBIN: 11.2 g/dL — AB (ref 13.0–17.0)
MCH: 29.4 pg (ref 26.0–34.0)
MCHC: 34.7 g/dL (ref 30.0–36.0)
MCV: 84.8 fL (ref 78.0–100.0)
Platelets: 268 10*3/uL (ref 150–400)
RBC: 3.81 MIL/uL — ABNORMAL LOW (ref 4.22–5.81)
RDW: 12.9 % (ref 11.5–15.5)
WBC: 8.9 10*3/uL (ref 4.0–10.5)

## 2014-05-16 LAB — LIPASE, BLOOD: Lipase: 14 U/L (ref 11–59)

## 2014-05-16 LAB — TROPONIN I: Troponin I: <0.3 ng/mL (ref <0.30)

## 2014-05-16 LAB — CBC WITH DIFFERENTIAL/PLATELET
Basophils Absolute: 0 10*3/uL (ref 0.0–0.1)
Basophils Relative: 0 % (ref 0–1)
Eosinophils Absolute: 0 10*3/uL (ref 0.0–0.7)
Eosinophils Relative: 0 % (ref 0–5)
HCT: 30.6 % — ABNORMAL LOW (ref 39.0–52.0)
HEMOGLOBIN: 10.7 g/dL — AB (ref 13.0–17.0)
LYMPHS ABS: 0.8 10*3/uL (ref 0.7–4.0)
Lymphocytes Relative: 10 % — ABNORMAL LOW (ref 12–46)
MCH: 29.6 pg (ref 26.0–34.0)
MCHC: 35 g/dL (ref 30.0–36.0)
MCV: 84.8 fL (ref 78.0–100.0)
MONOS PCT: 17 % — AB (ref 3–12)
Monocytes Absolute: 1.4 10*3/uL — ABNORMAL HIGH (ref 0.1–1.0)
NEUTROS ABS: 5.9 10*3/uL (ref 1.7–7.7)
Neutrophils Relative %: 73 % (ref 43–77)
Platelets: 271 10*3/uL (ref 150–400)
RBC: 3.61 MIL/uL — AB (ref 4.22–5.81)
RDW: 12.8 % (ref 11.5–15.5)
WBC: 8.1 10*3/uL (ref 4.0–10.5)

## 2014-05-16 LAB — URINE MICROSCOPIC-ADD ON

## 2014-05-16 LAB — LACTIC ACID, PLASMA: Lactic Acid, Venous: 2.3 mmol/L — ABNORMAL HIGH (ref 0.5–2.2)

## 2014-05-16 LAB — COMPREHENSIVE METABOLIC PANEL
ALK PHOS: 885 U/L — AB (ref 39–117)
ALT: 14 U/L (ref 0–53)
AST: 68 U/L — ABNORMAL HIGH (ref 0–37)
Albumin: 2.6 g/dL — ABNORMAL LOW (ref 3.5–5.2)
BILIRUBIN TOTAL: 0.7 mg/dL (ref 0.3–1.2)
BUN: 28 mg/dL — AB (ref 6–23)
CHLORIDE: 90 meq/L — AB (ref 96–112)
CO2: 19 meq/L (ref 19–32)
Calcium: 8.4 mg/dL (ref 8.4–10.5)
Creatinine, Ser: 1.98 mg/dL — ABNORMAL HIGH (ref 0.50–1.35)
GFR calc Af Amer: 32 mL/min — ABNORMAL LOW (ref >90)
GFR, EST NON AFRICAN AMERICAN: 28 mL/min — AB (ref >90)
GLUCOSE: 116 mg/dL — AB (ref 70–99)
POTASSIUM: 4.2 meq/L (ref 3.7–5.3)
Sodium: 123 mEq/L — ABNORMAL LOW (ref 137–147)
TOTAL PROTEIN: 6.3 g/dL (ref 6.0–8.3)

## 2014-05-16 LAB — MRSA PCR SCREENING: MRSA BY PCR: NEGATIVE

## 2014-05-16 LAB — CREATININE, SERUM
Creatinine, Ser: 1.74 mg/dL — ABNORMAL HIGH (ref 0.50–1.35)
GFR calc Af Amer: 37 mL/min — ABNORMAL LOW (ref >90)
GFR, EST NON AFRICAN AMERICAN: 32 mL/min — AB (ref >90)

## 2014-05-16 LAB — TSH: TSH: 0.74 u[IU]/mL (ref 0.350–4.500)

## 2014-05-16 MED ORDER — DOCUSATE SODIUM 100 MG PO CAPS
100.0000 mg | ORAL_CAPSULE | Freq: Two times a day (BID) | ORAL | Status: DC
  Administered 2014-05-16 – 2014-05-20 (×7): 100 mg via ORAL
  Filled 2014-05-16 (×9): qty 1

## 2014-05-16 MED ORDER — BRINZOLAMIDE 1 % OP SUSP
1.0000 [drp] | Freq: Two times a day (BID) | OPHTHALMIC | Status: DC
  Administered 2014-05-16 – 2014-05-20 (×8): 1 [drp] via OPHTHALMIC
  Filled 2014-05-16: qty 10

## 2014-05-16 MED ORDER — LATANOPROST 0.005 % OP SOLN
1.0000 [drp] | Freq: Every day | OPHTHALMIC | Status: DC
  Administered 2014-05-16 – 2014-05-19 (×4): 1 [drp] via OPHTHALMIC
  Filled 2014-05-16: qty 2.5

## 2014-05-16 MED ORDER — ASPIRIN EC 81 MG PO TBEC: 81.0000 mg | DELAYED_RELEASE_TABLET | Freq: Every day | ORAL | Status: DC

## 2014-05-16 MED ORDER — SODIUM CHLORIDE 0.9 % IV SOLN
INTRAVENOUS | Status: DC
  Administered 2014-05-16: 15:00:00 via INTRAVENOUS

## 2014-05-16 MED ORDER — ASPIRIN EC 325 MG PO TBEC
325.0000 mg | DELAYED_RELEASE_TABLET | Freq: Every day | ORAL | Status: DC
  Administered 2014-05-16 – 2014-05-19 (×4): 325 mg via ORAL
  Filled 2014-05-16 (×5): qty 1

## 2014-05-16 MED ORDER — VITAMIN D 1000 UNITS PO TABS
2000.0000 [IU] | ORAL_TABLET | Freq: Every day | ORAL | Status: DC
  Administered 2014-05-16 – 2014-05-19 (×4): 2000 [IU] via ORAL
  Filled 2014-05-16 (×5): qty 2

## 2014-05-16 MED ORDER — ONDANSETRON HCL 4 MG/2ML IJ SOLN: 4.0000 mg | Freq: Four times a day (QID) | INTRAMUSCULAR | Status: DC | PRN

## 2014-05-16 MED ORDER — HEPARIN SODIUM (PORCINE) 5000 UNIT/ML IJ SOLN
5000.0000 [IU] | Freq: Three times a day (TID) | INTRAMUSCULAR | Status: DC
  Administered 2014-05-16 – 2014-05-20 (×12): 5000 [IU] via SUBCUTANEOUS
  Filled 2014-05-16 (×14): qty 1

## 2014-05-16 MED ORDER — SODIUM CHLORIDE 0.9 % IV BOLUS (SEPSIS)
500.0000 mL | Freq: Once | INTRAVENOUS | Status: AC
Start: 2014-05-16 — End: 2014-05-16
  Administered 2014-05-16: 500 mL via INTRAVENOUS

## 2014-05-16 MED ORDER — ALUM & MAG HYDROXIDE-SIMETH 200-200-20 MG/5ML PO SUSP: 30.0000 mL | Freq: Four times a day (QID) | ORAL | Status: DC | PRN

## 2014-05-16 MED ORDER — PANTOPRAZOLE SODIUM 20 MG PO TBEC
20.0000 mg | DELAYED_RELEASE_TABLET | Freq: Every day | ORAL | Status: DC
  Administered 2014-05-16 – 2014-05-19 (×4): 20 mg via ORAL
  Filled 2014-05-16 (×5): qty 1

## 2014-05-16 MED ORDER — ONDANSETRON HCL 4 MG PO TABS: 4.0000 mg | ORAL_TABLET | Freq: Four times a day (QID) | ORAL | Status: DC | PRN

## 2014-05-16 MED ORDER — SODIUM CHLORIDE 0.9 % IJ SOLN
3.0000 mL | Freq: Two times a day (BID) | INTRAMUSCULAR | Status: DC
  Administered 2014-05-16 – 2014-05-19 (×7): 3 mL via INTRAVENOUS

## 2014-05-16 MED ORDER — ACETAMINOPHEN 325 MG PO TABS
650.0000 mg | ORAL_TABLET | Freq: Four times a day (QID) | ORAL | Status: DC | PRN
  Administered 2014-05-17 – 2014-05-20 (×3): 650 mg via ORAL
  Filled 2014-05-16 (×3): qty 2

## 2014-05-16 MED ORDER — ACETAMINOPHEN 650 MG RE SUPP: 650.0000 mg | Freq: Four times a day (QID) | RECTAL | Status: DC | PRN

## 2014-05-16 NOTE — ED Notes (Signed)
Contacted [place], states not ready for pt yet, 3S RN Jarrett Soho notified pt to be transported upstairs at this time.

## 2014-05-16 NOTE — ED Notes (Signed)
EDP at bedside  

## 2014-05-16 NOTE — ED Notes (Signed)
Pt's granddaughter notified pt is in the [facility] ED with verbal permission from patient.

## 2014-05-16 NOTE — ED Provider Notes (Signed)
CSN: [id number]     Arrival date & time 05/16/14  1113 History   First MD Initiated Contact with Patient 05/16/14 1116     Chief Complaint  Patient presents with  . Hypotension     (Consider location/radiation/quality/duration/timing/severity/associated sxs/prior Treatment) Patient is a 80 y.o. male presenting with vomiting.  Emesis Severity:  Moderate Duration: a few hours. Timing:  Intermittent Quality:  Unable to specify (Per report, family stated it was mucus like.  ) Progression:  Partially resolved Chronicity:  New Relieved by:  Nothing Worsened by:  Nothing tried Associated symptoms: no abdominal pain, no diarrhea and no fever     Past Medical History  Diagnosis Date  . Edema of both legs 06/09/2012    2D Echo - EF 40-45, left ventricle cavity moderately dilated  . HTN (hypertension), benign     On multiple medications  . Osteoarthritis (arthritis due to wear and tear of joints)     S/p L Knee Arthroplasty  . Diastolic dysfunction     Chronic  . Cardiomyopathy     EF 40-45% 2D 6/13  . Prediabetes   . Hyperlipidemia   . Anemia   . Vitamin D deficiency   . Glaucoma    Past Surgical History  Procedure Laterality Date  . Left knee arthroplasty  09/2003  . Transurethral resection of prostate    . Cataract extraction    . Cervical laminectomy     Family History  Problem Relation Age of Onset  . Breast cancer Mother   . Stroke Brother    History  Substance Use Topics  . Smoking status: Never Smoker   . Smokeless tobacco: Never Used  . Alcohol Use: No    Review of Systems  Constitutional: Negative for fever.  Respiratory: Positive for cough ("a little"). Negative for shortness of breath.   Cardiovascular: Negative for chest pain.  Gastrointestinal: Positive for nausea and vomiting. Negative for abdominal pain and diarrhea.  All other systems reviewed and are negative.     Allergies  Doxazosin  Home Medications   Prior to Admission medications    Medication Sig Start Date End Date Taking? Authorizing Provider  amLODipine (NORVASC) 5 MG tablet Take 5 mg by mouth daily.    Historical Provider, MD  AZOPT 1 % ophthalmic suspension Place 1 drop into the right eye daily. 04/13/13   Historical Provider, MD  cloNIDine (CATAPRES) 0.1 MG tablet Take 0.1 mg by mouth daily.    Historical Provider, MD  Cyanocobalamin (VITAMIN B-12 PO) Take 1 tablet by mouth daily.    Historical Provider, MD  fexofenadine (ALLEGRA) 180 MG tablet Take 180 mg by mouth daily.    Historical Provider, MD  finasteride (PROSCAR) 5 MG tablet Take 1 tablet (5 mg total) by mouth daily. 04/15/14   Unk Pinto, MD  furosemide (LASIX) 40 MG tablet Take 40 mg by mouth daily.    Historical Provider, MD  isosorbide mononitrate (IMDUR) 120 MG 24 hr tablet Take 1 tablet (120 mg total) by mouth daily. 12/28/13   Vicie Mutters, PA-C  LUMIGAN 0.01 % SOLN Place 1 drop into both eyes at bedtime.  08/01/13   Historical Provider, MD  meloxicam (MOBIC) 15 MG tablet Take 1 tablet (15 mg total) by mouth daily. 12/28/13   Vicie Mutters, PA-C  metolazone (ZAROXOLYN) 2.5 MG tablet Take 2.5 mg by mouth every other day.    Historical Provider, MD  metoprolol succinate (TOPROL-XL) 50 MG 24 hr tablet Take 1 tablet (50 mg  total) by mouth daily. Take with or immediately following a meal. 04/15/14   Unk Pinto, MD  pantoprazole (PROTONIX) 20 MG tablet Take 20 mg by mouth daily.    Historical Provider, MD  quinapril (ACCUPRIL) 40 MG tablet Take 40 mg by mouth at bedtime.    Historical Provider, MD  VITAMIN D, ERGOCALCIFEROL, PO Take 2 tablets by mouth daily.     Historical Provider, MD   BP 83/45  Pulse 63  Temp(Src) 98.2 F (36.8 C) (Oral)  SpO2 96% Physical Exam  Nursing note and vitals reviewed. Constitutional: He is oriented to person, place, and time. He appears well-developed and well-nourished. No distress.  HENT:  Head: Normocephalic and atraumatic.  Mouth/Throat: Oropharynx is clear and  moist.  Eyes: Conjunctivae are normal. Pupils are equal, round, and reactive to light. No scleral icterus.  Neck: Neck supple.  Cardiovascular: Normal rate, regular rhythm, normal heart sounds and intact distal pulses.   No murmur heard. Pulmonary/Chest: Effort normal and breath sounds normal. No stridor. No respiratory distress. He has no wheezes. He has no rales.  Abdominal: Soft. He exhibits no distension. There is no tenderness. There is no rebound and no guarding.  Musculoskeletal: Normal range of motion. He exhibits edema (1+ BLE).  Neurological: He is alert and oriented to person, place, and time.  Skin: Skin is warm and dry. No rash noted.  Psychiatric: He has a normal mood and affect. His behavior is normal.    ED Course  Procedures (including critical care time) Labs Review Labs Reviewed  CBC WITH DIFFERENTIAL - Abnormal; Notable for the following:    RBC 3.61 (*)    Hemoglobin 10.7 (*)    HCT 30.6 (*)    Lymphocytes Relative 10 (*)    Monocytes Relative 17 (*)    Monocytes Absolute 1.4 (*)    All other components within normal limits  COMPREHENSIVE METABOLIC PANEL - Abnormal; Notable for the following:    Sodium 123 (*)    Chloride 90 (*)    Glucose, Bld 116 (*)    BUN 28 (*)    Creatinine, Ser 1.98 (*)    Albumin 2.6 (*)    AST 68 (*)    Alkaline Phosphatase 885 (*)    GFR calc non Af Amer 28 (*)    GFR calc Af Amer 32 (*)    All other components within normal limits  URINALYSIS, ROUTINE W REFLEX MICROSCOPIC - Abnormal; Notable for the following:    Color, Urine AMBER (*)    APPearance CLOUDY (*)    Hgb urine dipstick SMALL (*)    Bilirubin Urine SMALL (*)    Leukocytes, UA SMALL (*)    All other components within normal limits  LACTIC ACID, PLASMA - Abnormal; Notable for the following:    Lactic Acid, Venous 2.3 (*)    All other components within normal limits  URINE MICROSCOPIC-ADD ON - Abnormal; Notable for the following:    Bacteria, UA FEW (*)    All  other components within normal limits  TROPONIN I    Imaging Review Dg Chest Port 1 View  05/16/2014   CLINICAL DATA:  weakness  EXAM: PORTABLE CHEST - 1 VIEW  COMPARISON:  Portable chest dated 03/23/2014.  FINDINGS: Low lung volumes. Cardiac silhouette is enlarged. Lungs are clear. Degenerative changes within the shoulders. No acute osseus abnormalities. Atherosclerotic calcifications in the aorta.  IMPRESSION: No active disease.   Electronically Signed   By: Margaree Mackintosh M.D.  On: 05/16/2014 12:01  All radiology studies independently viewed by me.      EKG Interpretation   Date/Time:  Thursday May 16 2014 11:37:04 EDT Ventricular Rate:  59 PR Interval:  190 QRS Duration: 128 QT Interval:  440 QTC Calculation: 435 R Axis:   -48 Text Interpretation:  Sinus bradycardia with occasional Premature  ventricular complexes Left axis deviation Non-specific intra-ventricular  conduction block Abnormal ECG nonspecific t wave changes new compared to  prior.   nonspecific intra-ventricular conduction block is not new.  Confirmed by [facility]  MD, TREY ([id number]) on 05/16/2014 11:55:38 AM      MDM   Final diagnoses:  Acute kidney injury  Congestive dilated cardiomyopathy  Elevated alkaline phosphatase level  Hyponatremia  Hypotension    80 year old male presenting with hypotension. He reports that he had some nausea and vomiting earlier today, but now feels well. He has not had an appetite for several days and has not been eating or drinking much. His labs show acute kidney injury and elevated alkaline phosphatase.  Abdomen soft and nontender. Mentating well. Blood pressures improved somewhat with IV fluids. Plan right up quadrant ultrasound to evaluate elevated alkaline phosphatase and admission by internal medicine.    Arbie Cookey, MD 05/16/14 2013

## 2014-05-16 NOTE — ED Notes (Signed)
This RN present for I&O cath, pt tolerated well, pt in NAD at this time.

## 2014-05-16 NOTE — ED Notes (Addendum)
Per EMS, pt has been feeling weak and has had a decreased appetite for the past week. Pt states he has been coughing some at home. EMS reports a blood pressure reading of 70/30 in route. Pt is A&O x4 and denies any dizziness or lightheadedness. Pt had episode of tremmoring and weakness  as well as incontinence at home this morning, and home health nurse called EMS. Pt received 1071mL N/S with EMS. Pt currently in modified Trendelenburg.

## 2014-05-16 NOTE — ED Notes (Signed)
Admitting MD at bedside.

## 2014-05-16 NOTE — ED Notes (Signed)
Pt's Grandson at bedside, updated on status and admission per pt's request.

## 2014-05-16 NOTE — H&P (Signed)
[facility]                                                                                    Patient Demographics  Joshua Norton, is a 80 y.o. male  MRN: [id number]   DOB - 1919-10-26  Admit Date - 05/16/2014  Outpatient Primary MD for the patient is [name], MD   With History of -  Past Medical History  Diagnosis Date  . Edema of both legs 06/09/2012    2D Echo - EF 40-45, left ventricle cavity moderately dilated  . HTN (hypertension), benign     On multiple medications  . Osteoarthritis (arthritis due to wear and tear of joints)     S/p L Knee Arthroplasty  . Diastolic dysfunction     Chronic  . Cardiomyopathy     EF 40-45% 2D 6/13  . Prediabetes   . Hyperlipidemia   . Anemia   . Vitamin D deficiency   . Glaucoma       Past Surgical History  Procedure Laterality Date  . Left knee arthroplasty  09/2003  . Transurethral resection of prostate    . Cataract extraction    . Cervical laminectomy      in for   Chief Complaint  Patient presents with  . Hypotension     HPI  Joshua Norton  is a 80 y.o. male, with a past medical history of cardiomyopathy with an EF of 40% in 2013, glaucoma, lower extremity edema presents to the ED with Nausea and vomiting.  He reports that for the past week he has not felt like eating.  This morning he developed weakness in his legs and was unable to stand.  He vomited clear fluid 3 times and is complaining of intermittent RUQ/ Epigastric abdominal pain.  Per EPIC notes he saw his PCP on 5/22 as his blood pressure was low (sbp of 85).  He was advised to hold his diuretics.    In the emergency department his BP is 79/38, he has acute renal failure with a creatinine of 1.98 (baseline is less than one), hyponatremia (123), and an elevated alk phos of 885.  Review of Systems    In addition to the HPI above,  No Fever-chills, No Headache, No changes with Vision or hearing, No problems swallowing food or Liquids, No Chest  pain, Cough or Shortness of Breath, ++ intermittent epigastric pain, bowel movements are slow (once every 3 or 4 days) No Blood in stool or Urine, No dysuria, No new skin rashes or bruises, No new joints pains-aches,  ++Weakness in lower extremities.  He has chronic bilateral lower extremity edema. ++ Recent fall approx 1 month ago.  He got his foot caught in the door and scraped his heel No recent weight gain or loss, No polyuria, polydypsia or polyphagia, No significant Mental Stressors.  A full 10 point Review of Systems was done, except as stated above, all other Review of Systems were negative.   Social History History  Substance Use Topics  . Smoking status: Never Smoker   . Smokeless tobacco: Never Used  . Alcohol Use: No     Family History Family  History  Problem Relation Age of Onset  . Breast cancer Mother   . Stroke Brother      Prior to Admission medications   Medication Sig Start Date End Date Taking? Authorizing Provider  amLODipine (NORVASC) 5 MG tablet Take 5 mg by mouth at bedtime.    Yes Historical Provider, MD  aspirin EC 325 MG tablet Take 325 mg by mouth at bedtime.   Yes Historical Provider, MD  bimatoprost (LUMIGAN) 0.01 % SOLN Place 1 drop into both eyes at bedtime.   Yes Historical Provider, MD  brinzolamide (AZOPT) 1 % ophthalmic suspension Place 1 drop into both eyes 2 (two) times daily.   Yes Historical Provider, MD  Cholecalciferol (VITAMIN D) 2000 UNITS tablet Take 2,000 Units by mouth at bedtime.   Yes Historical Provider, MD  cloNIDine (CATAPRES) 0.1 MG tablet Take 0.1 mg by mouth at bedtime.    Yes Historical Provider, MD  dutasteride (AVODART) 0.5 MG capsule Take 0.5 mg by mouth at bedtime.   Yes Historical Provider, MD  fexofenadine (ALLEGRA) 180 MG tablet Take 180 mg by mouth at bedtime.    Yes Historical Provider, MD  finasteride (PROSCAR) 5 MG tablet Take 5 mg by mouth at bedtime.   Yes Historical Provider, MD  furosemide (LASIX) 40 MG  tablet Take 40 mg by mouth at bedtime.    Yes Historical Provider, MD  GLUCOSAMINE-CHONDROITIN-MSM PO Take 1 tablet by mouth at bedtime. Glucosamine 1500 mg/ chrondroiton 1200 mg   Yes Historical Provider, MD  isosorbide mononitrate (IMDUR) 120 MG 24 hr tablet Take 120 mg by mouth at bedtime.   Yes Historical Provider, MD  meloxicam (MOBIC) 15 MG tablet Take 15 mg by mouth at bedtime.   Yes Historical Provider, MD  metolazone (ZAROXOLYN) 2.5 MG tablet Take 2.5 mg by mouth at bedtime.    Yes Historical Provider, MD  metoprolol succinate (TOPROL-XL) 50 MG 24 hr tablet Take 50 mg by mouth at bedtime. Take with or immediately following a meal.   Yes Historical Provider, MD  Multiple Vitamin (MULTIVITAMIN WITH MINERALS) TABS tablet Take 1 tablet by mouth at bedtime.   Yes Historical Provider, MD  pantoprazole (PROTONIX) 20 MG tablet Take 20 mg by mouth at bedtime.    Yes Historical Provider, MD  quinapril (ACCUPRIL) 40 MG tablet Take 40 mg by mouth at bedtime.   Yes Historical Provider, MD  vitamin B-12 (CYANOCOBALAMIN) 1000 MCG tablet Take 1,000 mcg by mouth at bedtime.   Yes Historical Provider, MD    Allergies  Allergen Reactions  . Doxazosin Swelling    Physical Exam  Vitals  Blood pressure 104/51, pulse 58, temperature 98.2 F (36.8 C), temperature source Oral, resp. rate 13, SpO2 100.00%.   General:  lying in trendelenburg position in the ED, NAD, pleasant, A&O  Psych:  Normal affect and insight, Not Suicidal or Homicidal, Awake Alert, Oriented X 3.  Neuro:   No F.N deficits, ALL C.Nerves Intact, strength is symmetrically weak in all 4 extremities, Sensation intact all 4 extremities  ENT:  Ears and Eyes appear Normal, Conjunctivae clear,  Moist Oral Mucosa.  Neck:  Supple Neck, No JVD, No cervical lymphadenopathy appreciated, No Carotid Bruits.  Respiratory:  Symmetrical Chest wall movement, Good air movement bilaterally, CTAB.  Cardiac:  RRR, No Gallops, Rubs or Murmurs, No  Parasternal Heave.  Abdomen:  Positive Bowel Sounds, Abdomen Soft, minimally tender in the RUQ, No organomegaly appriciated  Skin:  No Cyanosis,  No Skin Rash or Bruise.  Extremities:  Patient able to move all 4.  Weak in all 4.  No cyanosis.  Lower ext wrapped.  Feet appear swollen.   Data Review  CBC  Recent Labs Lab 05/16/14 1139  WBC 8.1  HGB 10.7*  HCT 30.6*  PLT 271  MCV 84.8  MCH 29.6  MCHC 35.0  RDW 12.8  LYMPHSABS 0.8  MONOABS 1.4*  EOSABS 0.0  BASOSABS 0.0   ------------------------------------------------------------------------------------------------------------------  Chemistries   Recent Labs Lab 05/16/14 1139  NA 123*  K 4.2  CL 90*  CO2 19  GLUCOSE 116*  BUN 28*  CREATININE 1.98*  CALCIUM 8.4  AST 68*  ALT 14  ALKPHOS 885*  BILITOT 0.7   ------------------------------------------------------------------------------------------------------------------  Cardiac Enzymes  Recent Labs Lab 05/16/14 1139  TROPONINI <0.30    Urinalysis    Component Value Date/Time   COLORURINE AMBER* 05/16/2014 1246   APPEARANCEUR CLOUDY* 05/16/2014 1246   LABSPEC 1.019 05/16/2014 1246   PHURINE 5.5 05/16/2014 1246   GLUCOSEU NEGATIVE 05/16/2014 1246   HGBUR SMALL* 05/16/2014 1246   BILIRUBINUR SMALL* 05/16/2014 [place] 05/16/2014 1246   PROTEINUR NEGATIVE 05/16/2014 1246   UROBILINOGEN 1.0 05/16/2014 1246   NITRITE NEGATIVE 05/16/2014 1246   LEUKOCYTESUR SMALL* 05/16/2014 1246    ----------------------------------------------------------------------------------------------------------------  Imaging results:   Dg Chest Port 1 View  05/16/2014   CLINICAL DATA:  weakness  EXAM: PORTABLE CHEST - 1 VIEW  COMPARISON:  Portable chest dated 03/23/2014.  FINDINGS: Low lung volumes. Cardiac silhouette is enlarged. Lungs are clear. Degenerative changes within the shoulders. No acute osseus abnormalities. Atherosclerotic calcifications in the aorta.   IMPRESSION: No active disease.   Electronically Signed   By: Margaree Mackintosh M.D.   On: 05/16/2014 12:01    My personal review of EKG: bradycardic with PVC.  No ST elevation or depression.    Assessment & Plan  Active Problems:   Acute kidney injury   Hypotension   Hyponatremia   Elevated alkaline phosphatase level   Lactic acidosis   Hypotension and weakness Uncertain etiology. ? Biliary issue given Abdominal pain, anorexia and elevated alk phos.  Holding all BP meds and diuretics.Cautiously hydrate-has hx of Systolic CHF PT eval tomorrow when BP is more stable. Admit to [facility] unit.  Vomiting Possibly from significant hypotension,  biliary etiology or viral illness. Supportive care with gentle IV fluids Abdominal Ultrasound ordered. Check Lipase Follow LFTs. Cycle troponins.  Elevated alk phos Ast Slightly elevated as well but ALT and t bili are normal. No recent traumatic injury. Fractionate Alk phos. Pending ultrasound-if negative will likely need a MRCP  Hyponatremia From Anorexia and vomiting. Give IVF and hold diuretics.  Acute Kidney injury Urine is clear Likely from Anorexia combined with Diuretics and ACE-I Hold nephrotoxic medications Monitor bmet.  Chronic Systolic CHF EF 70% in 9628 with global hypokinesis. Appears dry at this time.  Giving IVF-watch closely Monitor volume status.   DVT Prophylaxis Heparin   AM Labs Ordered, also please review Full Orders  Family Communication:   None at bedside.  Patient alert and orientated.   Code Status:    Likely DC to home vs rehab.  Patient lives alone.  Grand daughter checks on him 2-3 times a week.  Condition: guarded.  Time spent in minutes : [place] PA-C on 05/16/2014 at 2:37 PM  Between 7am to 7pm - Pager - [phone]  After 7pm go to [email or web address] - password [email or web address]  And look  for the night coverage person covering me after hours  [place]   [phone]  Attending Patient was seen, examined,treatment plan was discussed with the Physician extender. I have directly reviewed the clinical findings, lab, imaging studies and management of this patient in detail. I have made the necessary changes to the above noted documentation, and agree with the documentation, as recorded by the Physician extender.  Nena Alexander MD [facility].

## 2014-05-16 NOTE — ED Notes (Signed)
Phlebotomy at bedside.

## 2014-05-17 DIAGNOSIS — I369 Nonrheumatic tricuspid valve disorder, unspecified: Secondary | ICD-10-CM

## 2014-05-17 DIAGNOSIS — E872 Acidosis, unspecified: Secondary | ICD-10-CM

## 2014-05-17 LAB — COMPREHENSIVE METABOLIC PANEL
ALT: 12 U/L (ref 0–53)
AST: 40 U/L — AB (ref 0–37)
Albumin: 2.5 g/dL — ABNORMAL LOW (ref 3.5–5.2)
Alkaline Phosphatase: 810 U/L — ABNORMAL HIGH (ref 39–117)
BUN: 24 mg/dL — ABNORMAL HIGH (ref 6–23)
CHLORIDE: 92 meq/L — AB (ref 96–112)
CO2: 20 meq/L (ref 19–32)
Calcium: 8.2 mg/dL — ABNORMAL LOW (ref 8.4–10.5)
Creatinine, Ser: 1.25 mg/dL (ref 0.50–1.35)
GFR calc Af Amer: 56 mL/min — ABNORMAL LOW (ref >90)
GFR, EST NON AFRICAN AMERICAN: 48 mL/min — AB (ref >90)
Glucose, Bld: 111 mg/dL — ABNORMAL HIGH (ref 70–99)
POTASSIUM: 4 meq/L (ref 3.7–5.3)
Sodium: 126 mEq/L — ABNORMAL LOW (ref 137–147)
Total Bilirubin: 0.7 mg/dL (ref 0.3–1.2)
Total Protein: 6.1 g/dL (ref 6.0–8.3)

## 2014-05-17 LAB — CBC
HCT: 29.7 % — ABNORMAL LOW (ref 39.0–52.0)
Hemoglobin: 10.7 g/dL — ABNORMAL LOW (ref 13.0–17.0)
MCH: 29.8 pg (ref 26.0–34.0)
MCHC: 36 g/dL (ref 30.0–36.0)
MCV: 82.7 fL (ref 78.0–100.0)
PLATELETS: 303 10*3/uL (ref 150–400)
RBC: 3.59 MIL/uL — ABNORMAL LOW (ref 4.22–5.81)
RDW: 12.7 % (ref 11.5–15.5)
WBC: 7.7 10*3/uL (ref 4.0–10.5)

## 2014-05-17 LAB — LACTIC ACID, PLASMA: Lactic Acid, Venous: 0.9 mmol/L (ref 0.5–2.2)

## 2014-05-17 LAB — HEPATITIS PANEL, ACUTE
HCV Ab: NEGATIVE
HEP B C IGM: NONREACTIVE
Hep A IgM: NONREACTIVE
Hepatitis B Surface Ag: NEGATIVE

## 2014-05-17 MED ORDER — SODIUM CHLORIDE 0.9 % IV BOLUS (SEPSIS)
1000.0000 mL | Freq: Once | INTRAVENOUS | Status: AC
  Administered 2014-05-17: 1000 mL via INTRAVENOUS

## 2014-05-17 MED ORDER — SODIUM CHLORIDE 0.9 % IV SOLN
INTRAVENOUS | Status: DC
  Administered 2014-05-18 (×2): via INTRAVENOUS

## 2014-05-17 NOTE — Progress Notes (Signed)
Pt has ace compression wraps to B/L legs. Pt stated there were" no wounds on his legs and that the wraps were used for swelling". Will continue to monitor pt.

## 2014-05-17 NOTE — Progress Notes (Signed)
Pt requested the compression wraps to be removed. Ace wraps were removed. Skin intact.

## 2014-05-17 NOTE — Progress Notes (Signed)
Utilization review completed.  

## 2014-05-17 NOTE — Progress Notes (Signed)
  Echocardiogram 2D Echocardiogram has been performed.  [place] 05/17/2014, 3:31 PM

## 2014-05-17 NOTE — Progress Notes (Addendum)
[facility] TEAM 1 - Stepdown/ICU TEAM Progress Note  Joshua Norton WUJ:[id number] DOB: 01-Sep-1919 DOA: 05/16/2014 PCP: [name], MD  Admit HPI / Brief Narrative: Joshua Norton is a 80 y.o. BM PMHx Cardiomyopathy with an EF of 40% in 2013, chronic hyponatremia, HTN, HLD, glaucoma, lower extremity edema presents to the ED with Nausea and vomiting. He reports that for the past week he has not felt like eating. This morning he developed weakness in his legs and was unable to stand. He vomited clear fluid 3 times and is complaining of intermittent RUQ/ Epigastric abdominal pain. Per EPIC notes he saw his PCP on 5/22 as his blood pressure was low (sbp of 85). He was advised to hold his diuretics.  In the emergency department his BP is 79/38, he has acute renal failure with a creatinine of 1.98 (baseline is less than one), hyponatremia (123), and an elevated alk phos of 885.   HPI/Subjective: 6/5 patient states feels greatly improved ate part of his dinner last night and a portion of his breakfast this morning with negative N./V.  Assessment/Plan: Hypotension and weakness  -Uncertain etiology. ? Biliary issue given Abdominal pain, anorexia and elevated alk phos.  -Holding all BP meds and diuretics.Cautiously hydrate-has hx of Systolic CHF  -PT eval tomorrow when BP is more stable.    Vomiting  -Possibly from significant hypotension, biliary etiology or viral illness.  -Supportive care with gentle IV fluids  -Abdominal Ultrasound; normal  -Lipase normal -Follow LFTs.  -Troponin x3 negative    Elevated alk phos  -AST Slightly elevated as well but ALT and t bili are normal. (Normalized with hydration) -No recent traumatic injury.  -Fractionate Alk phos. pending -Ultrasound negative; liver enzymes normalizing  Hyponatremia (chronically on low side high 120 -130) -Dehydrated From Anorexia and vomiting.  -Continue IVF and hold diuretics.   Acute Kidney injury  -Urine is clear    -Resolving with hydration, most likely secondary to Anorexia combined with Diuretics and ACE-I  -No Hold nephrotoxic medications  -Continue to monitor bmet.   Chronic Systolic CHF  -EF 95% in 6213 with global hypokinesis.  -Patient dehydrated will hydrate overnight; watch for fluid overload (patient already feeling better)      Code Status: FULL Family Communication: Granddaughter present  Disposition Plan: DC in a.m. if labs continued to normalize and echocardiogram negative    Consultants: NA  Procedure/Significant Events: 06/09/2012 echocardiogram - Left ventricle: Moderately dilated. Concentric hypertrophy. May represent hypertensive cardiomyopathy. -LVEF=  40% to 45%, with beat to beat variation and significant ventricular ectopy. -Incoordinate septal motion and moderate global hypokinesis.  - Right atrium: mildly dilated. - Tricuspid valve: Trivial regurgitation. - Pulmonary arteries: PA peak pressure: 56mm Hg (S). 6/4 PCXR;Cardiac silhouette is enlarged. No active disease.  6/4 Abdominal ultrasound;Right renal cyst. Pancreas not visualized due overlying bowel gas.   Culture 6/4 MRSA by PCR negative 6/5 urine pending  Antibiotics: NA   DVT prophylaxis: Heparin   Devices NA   LINES / TUBES:  6/4 20 ga left forearm    Continuous Infusions: . sodium chloride 75 mL/hr at 05/16/14 1507    Objective: VITAL SIGNS: Temp: 98.6 F (37 C) (06/05 0800) Temp src: Oral (06/05 0800) BP: 97/44 mmHg (06/05 0800) Pulse Rate: 74 (06/05 0427) SPO2; 93% on room air FIO2:   Intake/Output Summary (Last 24 hours) at 05/17/14 1128 Last data filed at 05/17/14 0429  Gross per 24 hour  Intake   1650 ml  Output    950  ml  Net    700 ml     Exam: General: A./O. x4, NAD, No acute respiratory distress Lungs: Clear to auscultation bilaterally without wheezes or crackles Cardiovascular: Regular rate and rhythm without murmur gallop or rub normal S1 and S2 Abdomen:  Nontender, nondistended, soft, bowel sounds positive, no rebound, no ascites, no appreciable mass Extremities: No significant cyanosis, clubbing, bilateral +1 pedal edema    Data Reviewed: Basic Metabolic Panel:  Recent Labs Lab 05/16/14 1139 05/16/14 1620 05/17/14 0326  NA 123*  --  126*  K 4.2  --  4.0  CL 90*  --  92*  CO2 19  --  20  GLUCOSE 116*  --  111*  BUN 28*  --  24*  CREATININE 1.98* 1.74* 1.25  CALCIUM 8.4  --  8.2*   Liver Function Tests:  Recent Labs Lab 05/16/14 1139 05/17/14 0326  AST 68* 40*  ALT 14 12  ALKPHOS 885* 810*  BILITOT 0.7 0.7  PROT 6.3 6.1  ALBUMIN 2.6* 2.5*    Recent Labs Lab 05/16/14 1620  LIPASE 14   No results found for this basename: AMMONIA,  in the last 168 hours CBC:  Recent Labs Lab 05/16/14 1139 05/16/14 1620 05/17/14 0326  WBC 8.1 8.9 7.7  NEUTROABS 5.9  --   --   HGB 10.7* 11.2* 10.7*  HCT 30.6* 32.3* 29.7*  MCV 84.8 84.8 82.7  PLT 271 268 303   Cardiac Enzymes:  Recent Labs Lab 05/16/14 1139 05/16/14 1620  TROPONINI <0.30 <0.30   BNP (last 3 results) No results found for this basename: PROBNP,  in the last 8760 hours CBG: No results found for this basename: GLUCAP,  in the last 168 hours  Recent Results (from the past 240 hour(s))  MRSA PCR SCREENING     Status: None   Collection Time    05/16/14  3:00 PM      Result Value Ref Range Status   MRSA by PCR NEGATIVE  NEGATIVE Final   Comment:            The GeneXpert MRSA Assay (FDA     approved for NASAL specimens     only), is one component of a     comprehensive MRSA colonization     surveillance program. It is not     intended to diagnose MRSA     infection nor to guide or     monitor treatment for     MRSA infections.     Studies:  Recent x-ray studies have been reviewed in detail by the Attending Physician  Scheduled Meds:  Scheduled Meds: . aspirin EC  325 mg Oral QHS  . brinzolamide  1 drop Both Eyes BID  . cholecalciferol   2,000 Units Oral QHS  . docusate sodium  100 mg Oral BID  . heparin  5,000 Units Subcutaneous 3 times per day  . latanoprost  1 drop Both Eyes QHS  . pantoprazole  20 mg Oral QHS  . sodium chloride  3 mL Intravenous Q12H    Time spent on care of this patient: 40 mins   Allie Bossier , MD   [facility] Office  [phone] Pager [phone]  On-Call/Text Page:      [email or web address]      password [email or web address]  If 7PM-7AM, please contact night-coverage [email or web address] Password [email or web address] 05/17/2014, 11:28 AM   LOS: 1 day

## 2014-05-17 NOTE — Evaluation (Signed)
Physical Therapy Evaluation Patient Details Name: Joshua Norton MRN: [id number] DOB: 03-Mar-1919 Today's Date: 05/17/2014   History of Present Illness  80 y.o. male, with a past medical history of cardiomyopathy with an EF of 40% in 2013, glaucoma, lower extremity edema presents to the ED with Nausea and vomiting.  He vomited clear fluid 3 times and is complaining of intermittent RUQ/ Epigastric abdominal pain.   Clinical Impression  Pt adm from home due to the above. Pt presents with decreased independence with functional mobility and overall deconditioning. Pt to benefit from skilled acute PT to address deficits and maximize functional mobility. PTA pt was having increased difficulty with mobility and ADLs. Pt lives alone most of the time and has granddaughter who stays at times and aide 2 days a week. Pt will require SNF for post acute rehab upon acute D/C at this time.     Follow Up Recommendations SNF;Supervision/Assistance - 24 hour    Equipment Recommendations  None recommended by PT    Recommendations for Other Services OT consult     Precautions / Restrictions Precautions Precautions: Fall Restrictions Weight Bearing Restrictions: No      Mobility  Bed Mobility Overal bed mobility: Needs Assistance Bed Mobility: Supine to Sit     Supine to sit: Mod assist;HOB elevated     General bed mobility comments: pt with difficulty advancing LEs and trunk to EOB due to generalized weakness; max cues for hand placement and sequencing   Transfers Overall transfer level: Needs assistance Equipment used: Rolling walker (2 wheeled) Transfers: Stand Pivot Transfers;Sit to/from Stand Sit to Stand: Mod assist;From elevated surface Stand pivot transfers: Mod assist       General transfer comment: cues for hand placement and upright posture; pt requries incr (A) due to weakness and fatigue   Ambulation/Gait                Stairs            Wheelchair Mobility     Modified Rankin (Stroke Patients Only)       Balance Overall balance assessment: Needs assistance Sitting-balance support: Feet supported;No upper extremity supported Sitting balance-Leahy Scale: Fair     Standing balance support: During functional activity;Bilateral upper extremity supported Standing balance-Leahy Scale: Poor Standing balance comment: requires (A) and UEs supported by RW                              Pertinent Vitals/Pain No c/o pain     Home Living Family/patient expects to be discharged to:: Skilled nursing facility                 Additional Comments: pt lives alone; has aide 2 days a week and granddaughter who stays with him "sometimes"     Prior Function Level of Independence: Needs assistance   Gait / Transfers Assistance Needed: pt recently began ambulating with RW   ADL's / Homemaking Assistance Needed: requires (A) from aide for bathing and dressing; reports other days of the week he does not change clothes         Hand Dominance        Extremity/Trunk Assessment   Upper Extremity Assessment: Defer to OT evaluation           Lower Extremity Assessment: Generalized weakness      Cervical / Trunk Assessment: Kyphotic  Communication   Communication: HOH  Cognition Arousal/Alertness: Awake/alert Behavior During Therapy: [facility] for  tasks assessed/performed Overall Cognitive Status: Within Functional Limits for tasks assessed                      General Comments      Exercises General Exercises - Lower Extremity Ankle Circles/Pumps: AROM;Strengthening;Both;10 reps;Seated Long Arc Quad: AROM;Strengthening;Both;10 reps;Seated      Assessment/Plan    PT Assessment Patient needs continued PT services  PT Diagnosis Difficulty walking;Generalized weakness   PT Problem List Decreased strength;Decreased activity tolerance;Decreased balance;Decreased mobility  PT Treatment Interventions DME  instruction;Gait training;Functional mobility training;Therapeutic activities;Therapeutic exercise;Balance training;Neuromuscular re-education;Patient/family education   PT Goals (Current goals can be found in the Care Plan section) Acute Rehab PT Goals Patient Stated Goal: to get stronger  PT Goal Formulation: With patient Time For Goal Achievement: 05/24/14 Potential to Achieve Goals: Good    Frequency Min 2X/week   Barriers to discharge Decreased caregiver support lives alone     Co-evaluation               End of Session Equipment Utilized During Treatment: Gait belt Activity Tolerance: Patient tolerated treatment well Patient left: in chair;with call bell/phone within reach;with nursing/sitter in room Nurse Communication: Mobility status         Time: 1031-5945 PT Time Calculation (min): 19 min   Charges:   PT Evaluation $Initial PT Evaluation Tier I: 1 Procedure PT Treatments $Therapeutic Activity: 8-22 mins   PT G CodesKennis Carina [place], [place]  [phone] 05/17/2014, 9:10 AM

## 2014-05-17 NOTE — Progress Notes (Signed)
Nutrition Brief Note  Patient identified on the Malnutrition Screening Tool (MST) Report Pt reports no recent weight loss. Pt reports weight fluctuations are from his CHF and fluid retention. Pt states that he lives alone mostly, his granddaughter stays three nights a week. Pt does the cooking and enjoys cooking. Pt admitted with vomiting which has now improved and felt to be from hypotension. Pt tolerated lunch. Per MD note plans for d/c tomorrow.  Wt Readings from Last 15 Encounters:  05/17/14 192 lb 10.9 oz (87.4 kg)  03/23/14 226 lb (102.513 kg)  12/26/13 226 lb 6.4 oz (102.694 kg)  11/22/13 217 lb (98.431 kg)  11/19/13 214 lb (97.07 kg)  08/23/13 223 lb (101.152 kg)  04/24/13 220 lb 9.6 oz (100.064 kg)  06/10/12 219 lb 2.2 oz (99.4 kg)    Body mass index is 29.3 kg/(m^2). Patient meets criteria for overweight based on current BMI.   Current diet order is Dysphagia 3, patient is consuming approximately >50% of meals at this time. Labs and medications reviewed.   No nutrition interventions warranted at this time. If nutrition issues arise, please consult RD.   [place], [place], [place] Pager [phone] After Hours Pager

## 2014-05-18 DIAGNOSIS — D649 Anemia, unspecified: Secondary | ICD-10-CM

## 2014-05-18 DIAGNOSIS — R5383 Other fatigue: Secondary | ICD-10-CM

## 2014-05-18 DIAGNOSIS — R5381 Other malaise: Secondary | ICD-10-CM

## 2014-05-18 LAB — URINE CULTURE

## 2014-05-18 LAB — CBC
HCT: 29.9 % — ABNORMAL LOW (ref 39.0–52.0)
HEMOGLOBIN: 10.5 g/dL — AB (ref 13.0–17.0)
MCH: 29.1 pg (ref 26.0–34.0)
MCHC: 35.1 g/dL (ref 30.0–36.0)
MCV: 82.8 fL (ref 78.0–100.0)
Platelets: 295 10*3/uL (ref 150–400)
RBC: 3.61 MIL/uL — AB (ref 4.22–5.81)
RDW: 12.9 % (ref 11.5–15.5)
WBC: 8.8 10*3/uL (ref 4.0–10.5)

## 2014-05-18 LAB — MAGNESIUM: Magnesium: 1.6 mg/dL (ref 1.5–2.5)

## 2014-05-18 LAB — BASIC METABOLIC PANEL
BUN: 17 mg/dL (ref 6–23)
CHLORIDE: 97 meq/L (ref 96–112)
CO2: 21 meq/L (ref 19–32)
CREATININE: 1 mg/dL (ref 0.50–1.35)
Calcium: 8.2 mg/dL — ABNORMAL LOW (ref 8.4–10.5)
GFR calc non Af Amer: 63 mL/min — ABNORMAL LOW (ref >90)
GFR, EST AFRICAN AMERICAN: 73 mL/min — AB (ref >90)
Glucose, Bld: 113 mg/dL — ABNORMAL HIGH (ref 70–99)
POTASSIUM: 3.8 meq/L (ref 3.7–5.3)
Sodium: 130 mEq/L — ABNORMAL LOW (ref 137–147)

## 2014-05-18 MED ORDER — FLUOXETINE HCL 20 MG PO CAPS
20.0000 mg | ORAL_CAPSULE | Freq: Every day | ORAL | Status: DC
  Administered 2014-05-18 – 2014-05-20 (×3): 20 mg via ORAL
  Filled 2014-05-18 (×3): qty 1

## 2014-05-18 NOTE — Progress Notes (Signed)
Joshua Norton is a 80 y.o. male patient who transferred  from 2S awake, alert  & orientated  X 3, Full Code, VSS - Blood pressure 152/62, pulse 94, temperature 101.3 F (38.5 C), temperature source Oral, resp. rate 20, height 5' 8.4" (1.737 m), weight 87.454 kg (192 lb 12.8 oz), SpO2 97.00%.RA, no c/o shortness of breath, no c/o chest pain, no distress noted. Tele # [id number] placed and pt is currently running: NSR   IV sites WDL:  with a transparent dsg that's clean dry and intact.  Allergies:   Allergies  Allergen Reactions  . Doxazosin Swelling     Past Medical History  Diagnosis Date  . Edema of both legs 06/09/2012    2D Echo - EF 40-45, left ventricle cavity moderately dilated  . HTN (hypertension), benign     On multiple medications  . Osteoarthritis (arthritis due to wear and tear of joints)     S/p L Knee Arthroplasty  . Diastolic dysfunction     Chronic  . Cardiomyopathy     EF 40-45% 2D 6/13  . Prediabetes   . Hyperlipidemia   . Anemia   . Vitamin D deficiency   . Glaucoma     Pt orientation to unit, room and routine. SR up x 2, fall risk assessment complete with Patient verbalizing understanding of risks associated with falls. Pt verbalizes an understanding of how to use the call bell and to call for help before getting out of bed.  Skin, clean-dry- intact without evidence of bruising, or skin tears.   No evidence of skin break down noted on exam.  Will cont to monitor and assist as needed.  Josephine Cables [place], RN 05/18/2014 9:11 PM

## 2014-05-18 NOTE — Progress Notes (Signed)
[facility] TEAM 1 - Stepdown/ICU TEAM Progress Note  Tad Fancher EQA:[id number] DOB: 08-27-1919 DOA: 05/16/2014 PCP: [name], MD  Admit HPI / Brief Narrative: Abad Manard is a 80 y.o. BM PMHx Cardiomyopathy with an EF of 40% in 2013, chronic hyponatremia, HTN, HLD, glaucoma, lower extremity edema presents to the ED with Nausea and vomiting. He reports that for the past week he has not felt like eating. This morning he developed weakness in his legs and was unable to stand. He vomited clear fluid 3 times and is complaining of intermittent RUQ/ Epigastric abdominal pain. Per EPIC notes he saw his PCP on 5/22 as his blood pressure was low (sbp of 85). He was advised to hold his diuretics.  In the emergency department his BP is 79/38, he has acute renal failure with a creatinine of 1.98 (baseline is less than one), hyponatremia (123), and an elevated alk phos of 885.   HPI/Subjective: 6/6  patient states feels improved, but still has poor appetite. Per RN too tired to get out of bed today. ate part of his dinner last night and a portion of his breakfast this morning with negative N./V.  Assessment/Plan: Hypotension and weakness  -Uncertain etiology. ? Biliary issue given Abdominal pain, anorexia and elevated alk phos.  -Holding all BP meds and diuretics.Cautiously hydrate-has hx of Systolic CHF  -PT eval tomorrow when BP is more stable.    Vomiting  -Possibly from significant hypotension, biliary etiology or viral illness.  -Supportive care with gentle IV fluids  -Abdominal Ultrasound; normal  -Lipase normal -Follow LFTs.  -Troponin x3 negative    Elevated alk phos  -AST Slightly elevated as well but ALT and t bili are normal. (Normalized with hydration) -No recent traumatic injury.  -Fractionate Alk phos. pending -Ultrasound negative; liver enzymes normalizing  Hyponatremia (chronically on low side high 120 -130) -Dehydrated From Anorexia and vomiting.  -Continue IVF  and hold diuretics.  -Sodium corrected patient's baseline (130)  Acute Kidney injury  -Urine is clear  -Resolving with hydration, most likely secondary to Anorexia combined with Diuretics and ACE-I  -No Hold nephrotoxic medications  -Continue to monitor bmet.   Chronic Systolic CHF  -EF 97% in 9892 with global hypokinesis.  -Patient dehydrated will hydrate overnight; watch for fluid overload (patient already feeling better)   Fatigue -Possibly secondary to hyponatremia however sodium now at his baseline.TSH within normal limit, obtain free T4 free T3, a.m. cortisol. Thyroid within normal limit. Mild depression?  Will start SSRI   Code Status: FULL Family Communication: Granddaughter present  Disposition Plan: DC in a.m. if labs continued to normalize and echocardiogram negative    Consultants: NA  Procedure/Significant Events: 06/09/2012 echocardiogram - Left ventricle: Moderately dilated. Concentric hypertrophy. May represent hypertensive cardiomyopathy. -LVEF=  40% to 45%, with beat to beat variation and significant ventricular ectopy. -Incoordinate septal motion and moderate global hypokinesis.  - Right atrium: mildly dilated. - Tricuspid valve: Trivial regurgitation. - Pulmonary arteries: PA peak pressure: 61mm Hg (S). 6/4 PCXR;Cardiac silhouette is enlarged. No active disease.  6/4 Abdominal ultrasound;Right renal cyst. Pancreas not visualized due overlying bowel gas.   Culture 6/4 MRSA by PCR negative 6/5 urine pending  Antibiotics: NA   DVT prophylaxis: Heparin   Devices NA   LINES / TUBES:  6/4 20 ga left forearm    Continuous Infusions: . sodium chloride 75 mL/hr at 05/16/14 1507  . sodium chloride 75 mL/hr at 05/18/14 1108    Objective: VITAL SIGNS: Temp: 99.6 F (  37.6 C) (06/06 1611) Temp src: Oral (06/06 1611) BP: 131/58 mmHg (06/06 1611) Pulse Rate: 83 (06/06 1611) SPO2; 93% on room air FIO2:   Intake/Output Summary (Last 24 hours)  at 05/18/14 1633 Last data filed at 05/18/14 1300  Gross per 24 hour  Intake   1380 ml  Output   1150 ml  Net    230 ml     Exam: General: A./O. x4, NAD, No acute respiratory distress Lungs: Clear to auscultation bilaterally without wheezes or crackles Cardiovascular: Regular rate and rhythm without murmur gallop or rub normal S1 and S2 Abdomen: Nontender, nondistended, soft, bowel sounds positive, no rebound, no ascites, no appreciable mass Extremities: No significant cyanosis, clubbing, bilateral +1 pedal edema    Data Reviewed: Basic Metabolic Panel:  Recent Labs Lab 05/16/14 1139 05/16/14 1620 05/17/14 0326 05/18/14 0330  NA 123*  --  126* 130*  K 4.2  --  4.0 3.8  CL 90*  --  92* 97  CO2 19  --  20 21  GLUCOSE 116*  --  111* 113*  BUN 28*  --  24* 17  CREATININE 1.98* 1.74* 1.25 1.00  CALCIUM 8.4  --  8.2* 8.2*  MG  --   --   --  1.6   Liver Function Tests:  Recent Labs Lab 05/16/14 1139 05/17/14 0326  AST 68* 40*  ALT 14 12  ALKPHOS 885* 810*  BILITOT 0.7 0.7  PROT 6.3 6.1  ALBUMIN 2.6* 2.5*    Recent Labs Lab 05/16/14 1620  LIPASE 14   No results found for this basename: AMMONIA,  in the last 168 hours CBC:  Recent Labs Lab 05/16/14 1139 05/16/14 1620 05/17/14 0326 05/18/14 0330  WBC 8.1 8.9 7.7 8.8  NEUTROABS 5.9  --   --   --   HGB 10.7* 11.2* 10.7* 10.5*  HCT 30.6* 32.3* 29.7* 29.9*  MCV 84.8 84.8 82.7 82.8  PLT 271 268 303 295   Cardiac Enzymes:  Recent Labs Lab 05/16/14 1139 05/16/14 1620  TROPONINI <0.30 <0.30   BNP (last 3 results) No results found for this basename: PROBNP,  in the last 8760 hours CBG: No results found for this basename: GLUCAP,  in the last 168 hours  Recent Results (from the past 240 hour(s))  MRSA PCR SCREENING     Status: None   Collection Time    05/16/14  3:00 PM      Result Value Ref Range Status   MRSA by PCR NEGATIVE  NEGATIVE Final   Comment:            The GeneXpert MRSA Assay (FDA      approved for NASAL specimens     only), is one component of a     comprehensive MRSA colonization     surveillance program. It is not     intended to diagnose MRSA     infection nor to guide or     monitor treatment for     MRSA infections.     Studies:  Recent x-ray studies have been reviewed in detail by the Attending Physician  Scheduled Meds:  Scheduled Meds: . aspirin EC  325 mg Oral QHS  . brinzolamide  1 drop Both Eyes BID  . cholecalciferol  2,000 Units Oral QHS  . docusate sodium  100 mg Oral BID  . heparin  5,000 Units Subcutaneous 3 times per day  . latanoprost  1 drop Both Eyes QHS  . pantoprazole  20 mg Oral QHS  . sodium chloride  3 mL Intravenous Q12H    Time spent on care of this patient: 40 mins   Allie Bossier , MD   [facility] Office  [phone] Pager [phone]  On-Call/Text Page:      [email or web address]      password [email or web address]  If 7PM-7AM, please contact night-coverage [email or web address] Password [email or web address] 05/18/2014, 4:33 PM   LOS: 2 days

## 2014-05-19 DIAGNOSIS — I10 Essential (primary) hypertension: Secondary | ICD-10-CM

## 2014-05-19 LAB — BASIC METABOLIC PANEL
BUN: 14 mg/dL (ref 6–23)
CHLORIDE: 97 meq/L (ref 96–112)
CO2: 21 meq/L (ref 19–32)
Calcium: 8.3 mg/dL — ABNORMAL LOW (ref 8.4–10.5)
Creatinine, Ser: 0.87 mg/dL (ref 0.50–1.35)
GFR calc Af Amer: 84 mL/min — ABNORMAL LOW (ref >90)
GFR, EST NON AFRICAN AMERICAN: 73 mL/min — AB (ref >90)
Glucose, Bld: 99 mg/dL (ref 70–99)
Potassium: 3.8 mEq/L (ref 3.7–5.3)
SODIUM: 130 meq/L — AB (ref 137–147)

## 2014-05-19 LAB — CBC
HEMATOCRIT: 31.2 % — AB (ref 39.0–52.0)
Hemoglobin: 10.8 g/dL — ABNORMAL LOW (ref 13.0–17.0)
MCH: 28.7 pg (ref 26.0–34.0)
MCHC: 34.6 g/dL (ref 30.0–36.0)
MCV: 83 fL (ref 78.0–100.0)
Platelets: 319 10*3/uL (ref 150–400)
RBC: 3.76 MIL/uL — ABNORMAL LOW (ref 4.22–5.81)
RDW: 13 % (ref 11.5–15.5)
WBC: 8.1 10*3/uL (ref 4.0–10.5)

## 2014-05-19 LAB — CORTISOL-AM, BLOOD: CORTISOL - AM: 14.8 ug/dL (ref 4.3–22.4)

## 2014-05-19 LAB — T3, FREE: T3, Free: 2 pg/mL — ABNORMAL LOW (ref 2.3–4.2)

## 2014-05-19 LAB — T4, FREE: Free T4: 1.12 ng/dL (ref 0.80–1.80)

## 2014-05-19 LAB — MAGNESIUM: MAGNESIUM: 1.5 mg/dL (ref 1.5–2.5)

## 2014-05-19 LAB — GAMMA GT: GGT: 32 U/L (ref 7–51)

## 2014-05-19 MED ORDER — FINASTERIDE 5 MG PO TABS
5.0000 mg | ORAL_TABLET | Freq: Every day | ORAL | Status: DC
  Administered 2014-05-19: 5 mg via ORAL
  Filled 2014-05-19 (×2): qty 1

## 2014-05-19 MED ORDER — QUINAPRIL HCL 10 MG PO TABS
10.0000 mg | ORAL_TABLET | Freq: Every day | ORAL | Status: DC
  Administered 2014-05-19: 10 mg via ORAL
  Filled 2014-05-19 (×2): qty 1

## 2014-05-19 NOTE — Progress Notes (Signed)
PATIENT DETAILS Name: Joshua Norton Age: 80 y.o. Sex: male Date of Birth: Sep 14, 1919 Admit Date: 05/16/2014 Admitting Physician Dewayne Shorter Levora Dredge, MD OZH:[email or web address] DAVID, MD  Subjective: No major complaints this am.  Assessment/Plan: Active Problems: Hypotension -resolved -suspect that this was secondary to hypovolemia from vomiting and poor oral intaker -stop all IVF  Vomiting -etiology uncertain-?viral syndrome -Lipase within normal limits, troponins negative. Belly soft. -resolved with supportive care  ARF -likely pre-renal -resolved with IVF  Elevated Alkaline Phosphatase -Abd Ultrasound negative for biliary dilatation, other liver enzymes and Bilirubin negative -will check GGT and obtain MRCP-if neg-will defer further work up to the outpatient setting.  Hyponatremia -chronic issue -stable at 130-monitor BMET periodically  Chronic Systolic CHF  -EF 40% in 2013 with global hypokinesis-however repeat Echo this admit-shows EF 55-60 percent -BP stable-will resume anti-hypertensive over the next 1-2 days-currently clinically compensated  HTN -resume low dose Quinapril-and follow BP-resume other usual meds as tolerated  GERD -c/w PPI  Generalized Weakness -secondary to deconditioning from acute illness -SNF on discharge for rehab  Disposition: Remain inpatient  DVT Prophylaxis: Prophylactic Heparin   Code Status: Full code   Family Communication None at bedside  Procedures:  None  CONSULTS:  None  Time spent 40 minutes-which includes 50% of the time with face-to-face with patient/ family and coordinating care related to the above assessment and plan.    MEDICATIONS: Scheduled Meds: . aspirin EC  325 mg Oral QHS  . brinzolamide  1 drop Both Eyes BID  . cholecalciferol  2,000 Units Oral QHS  . docusate sodium  100 mg Oral BID  . FLUoxetine  20 mg Oral Daily  . heparin  5,000 Units Subcutaneous 3 times per day  . latanoprost  1  drop Both Eyes QHS  . pantoprazole  20 mg Oral QHS  . sodium chloride  3 mL Intravenous Q12H   Continuous Infusions:  PRN Meds:.acetaminophen, acetaminophen, alum & mag hydroxide-simeth, ondansetron (ZOFRAN) IV, ondansetron  Antibiotics: Anti-infectives   None       PHYSICAL EXAM: Vital signs in last 24 hours: Filed Vitals:   05/18/14 2105 05/18/14 2200 05/19/14 0421 05/19/14 0552  BP: 152/62 122/67    Pulse: 94 86    Temp: 101.3 F (38.5 C) 99.5 F (37.5 C)  99.3 F (37.4 C)  TempSrc: Oral Oral  Oral  Resp: 20     Height: 5' 8.4" (1.737 m)     Weight: 87.454 kg (192 lb 12.8 oz)  87.4 kg (192 lb 10.9 oz)   SpO2: 97%   97%    Weight change: -1.747 kg (-3 lb 13.6 oz) Filed Weights   05/18/14 0500 05/18/14 2105 05/19/14 0421  Weight: 89.2 kg (196 lb 10.4 oz) 87.454 kg (192 lb 12.8 oz) 87.4 kg (192 lb 10.9 oz)   Body mass index is 28.97 kg/(m^2).   Gen Exam: Awake and alert with clear speech.   Neck: Supple, No JVD.   Chest: B/L Clear.   CVS: S1 S2 Regular, no murmurs.  Abdomen: soft, BS +, non tender, non distended.  Extremities: TRACE edema, lower extremities warm to touch. Neurologic: Non Focal.   Skin: No Rash.   Wounds: N/A.   Intake/Output from previous day:  Intake/Output Summary (Last 24 hours) at 05/19/14 1058 Last data filed at 05/19/14 0512  Gross per 24 hour  Intake    840 ml  Output   1650 ml  Net   -810 ml  LAB RESULTS: CBC  Recent Labs Lab 05/16/14 1139 05/16/14 1620 05/17/14 0326 05/18/14 0330 05/19/14 0450  WBC 8.1 8.9 7.7 8.8 8.1  HGB 10.7* 11.2* 10.7* 10.5* 10.8*  HCT 30.6* 32.3* 29.7* 29.9* 31.2*  PLT 271 268 303 295 319  MCV 84.8 84.8 82.7 82.8 83.0  MCH 29.6 29.4 29.8 29.1 28.7  MCHC 35.0 34.7 36.0 35.1 34.6  RDW 12.8 12.9 12.7 12.9 13.0  LYMPHSABS 0.8  --   --   --   --   MONOABS 1.4*  --   --   --   --   EOSABS 0.0  --   --   --   --   BASOSABS 0.0  --   --   --   --     Chemistries   Recent Labs Lab  05/16/14 1139 05/16/14 1620 05/17/14 0326 05/18/14 0330 05/19/14 0450  NA 123*  --  126* 130* 130*  K 4.2  --  4.0 3.8 3.8  CL 90*  --  92* 97 97  CO2 19  --  20 21 21   GLUCOSE 116*  --  111* 113* 99  BUN 28*  --  24* 17 14  CREATININE 1.98* 1.74* 1.25 1.00 0.87  CALCIUM 8.4  --  8.2* 8.2* 8.3*  MG  --   --   --  1.6 1.5    CBG: No results found for this basename: GLUCAP,  in the last 168 hours  GFR Estimated Creatinine Clearance: 58.6 ml/min (by C-G formula based on Cr of 0.87).  Coagulation profile No results found for this basename: INR, PROTIME,  in the last 168 hours  Cardiac Enzymes  Recent Labs Lab 05/16/14 1139 05/16/14 1620  TROPONINI <0.30 <0.30    No components found with this basename: POCBNP,  No results found for this basename: DDIMER,  in the last 72 hours No results found for this basename: HGBA1C,  in the last 72 hours No results found for this basename: CHOL, HDL, LDLCALC, TRIG, CHOLHDL, LDLDIRECT,  in the last 72 hours  Recent Labs  05/16/14 1620  TSH 0.740   No results found for this basename: VITAMINB12, FOLATE, FERRITIN, TIBC, IRON, RETICCTPCT,  in the last 72 hours  Recent Labs  05/16/14 1620  LIPASE 14    Urine Studies No results found for this basename: UACOL, UAPR, USPG, UPH, UTP, UGL, UKET, UBIL, UHGB, UNIT, UROB, ULEU, UEPI, UWBC, URBC, UBAC, CAST, CRYS, UCOM, BILUA,  in the last 72 hours  MICROBIOLOGY: Recent Results (from the past 240 hour(s))  MRSA PCR SCREENING     Status: None   Collection Time    05/16/14  3:00 PM      Result Value Ref Range Status   MRSA by PCR NEGATIVE  NEGATIVE Final   Comment:            The GeneXpert MRSA Assay (FDA     approved for NASAL specimens     only), is one component of a     comprehensive MRSA colonization     surveillance program. It is not     intended to diagnose MRSA     infection nor to guide or     monitor treatment for     MRSA infections.  URINE CULTURE     Status: None    Collection Time    05/17/14 12:39 PM      Result Value Ref Range Status   Specimen Description URINE, RANDOM  Final   Special Requests NONE   Final   Culture  Setup Time     Final   Value: 05/17/2014 19:05     Performed at [facility]   Colony Count     Final   Value: 95,000 COLONIES/ML     Performed at [facility]   Culture     Final   Value: Multiple bacterial morphotypes present, none predominant. Suggest appropriate recollection if clinically indicated.     Performed at [facility]   Report Status 05/18/2014 FINAL   Final    RADIOLOGY STUDIES/RESULTS: US Abdomen Complete  05/16/2014   CLINICAL DATA:  Epigastric pain.  EXAM: ULTRASOUND ABDOMEN COMPLETE  COMPARISON:  None.  FINDINGS: Gallbladder:  No gallstones or wall thickening visualized. No sonographic Murphy sign noted.  Common bile duct:  Diameter: Measures 5.1 mm which is within normal limits.  Liver:  No focal lesion identified. Within normal limits in parenchymal echogenicity.  IVC:  No abnormality visualized.  Pancreas:  Not visualized due to overlying bowel gas.  Spleen:  Size and appearance within normal limits.  Right Kidney:  Length: 11.2 cm. Large cyst is seen extending from parapelvic region into cortex. Echogenicity within normal limits. No mass or hydronephrosis visualized.  Left Kidney:  Length: 11.2 cm. Echogenicity within normal limits. No mass or hydronephrosis visualized.  Abdominal aorta:  No aneurysm visualized.  Other findings:  None.  IMPRESSION: Right renal cyst. Pancreas not visualized due to overlying bowel gas. No other definite abnormality seen in the abdomen.   Electronically Signed   By: Roque Lias M.D.   On: 05/16/2014 17:30   Dg Chest Port 1 View  05/16/2014   CLINICAL DATA:  weakness  EXAM: PORTABLE CHEST - 1 VIEW  COMPARISON:  Portable chest dated 03/23/2014.  FINDINGS: Low lung volumes. Cardiac silhouette is enlarged. Lungs are clear. Degenerative changes within the  shoulders. No acute osseus abnormalities. Atherosclerotic calcifications in the aorta.  IMPRESSION: No active disease.   Electronically Signed   By: Salome Holmes M.D.   On: 05/16/2014 12:01    Callum Wolf Levora Dredge, MD  [facility] Pager:[phone]  If 7PM-7AM, please contact night-coverage [email or web address] Password [email or web address] 05/19/2014, 10:58 AM   LOS: 3 days   **Disclaimer: This note may have been dictated with voice recognition software. Similar sounding words can inadvertently be transcribed and this note may contain transcription errors which may not have been corrected upon publication of note.**

## 2014-05-20 ENCOUNTER — Inpatient Hospital Stay (HOSPITAL_COMMUNITY): Payer: Medicare HMO

## 2014-05-20 LAB — HEPATIC FUNCTION PANEL
ALT: 12 U/L (ref 0–53)
AST: 18 U/L (ref 0–37)
Albumin: 2.1 g/dL — ABNORMAL LOW (ref 3.5–5.2)
Alkaline Phosphatase: 444 U/L — ABNORMAL HIGH (ref 39–117)
BILIRUBIN TOTAL: 0.7 mg/dL (ref 0.3–1.2)
Bilirubin, Direct: 0.3 mg/dL (ref 0.0–0.3)
Indirect Bilirubin: 0.4 mg/dL (ref 0.3–0.9)
TOTAL PROTEIN: 5.7 g/dL — AB (ref 6.0–8.3)

## 2014-05-20 MED ORDER — FUROSEMIDE 40 MG PO TABS: 20.0000 mg | ORAL_TABLET | Freq: Every day | ORAL | Status: DC

## 2014-05-20 MED ORDER — FLUOXETINE HCL 20 MG PO CAPS: 20.0000 mg | ORAL_CAPSULE | Freq: Every day | ORAL | Status: DC

## 2014-05-20 MED ORDER — QUINAPRIL HCL 20 MG PO TABS: 20.0000 mg | ORAL_TABLET | Freq: Every day | ORAL | Status: DC

## 2014-05-20 NOTE — Discharge Summary (Addendum)
PATIENT DETAILS Name: Joshua Norton Age: 80 y.o. Sex: male Date of Birth: 25-Mar-1919 MRN: [id number]. Admit Date: 05/16/2014 Admitting Physician: Jonetta Osgood, MD BLT:[id number],ZRAQTMA DAVID, MD  Recommendations for Outpatient Follow-up:  Continue to monitor Electrolytes-diuretics have been restarted Please arrange follow up with Cardiology Outpatient work up for elevated Alkaline Phosphatase-MRI Abd showing possible bone mets to T11.Deferring all work to the outpatient setting.  Please check PSA as outpatient  PRIMARY DISCHARGE DIAGNOSIS:  Active Problems:   HTN (hypertension), benign   Congestive dilated cardiomyopathy- EF 40-45% 2D 6/13   Hyperlipidemia   Acute kidney injury   Hyponatremia   Elevated alkaline phosphatase level   Lactic acidosis   Other malaise and fatigue      PAST MEDICAL HISTORY: Past Medical History  Diagnosis Date  . Edema of both legs 06/09/2012    2D Echo - EF 40-45, left ventricle cavity moderately dilated  . HTN (hypertension), benign     On multiple medications  . Osteoarthritis (arthritis due to wear and tear of joints)     S/p L Knee Arthroplasty  . Diastolic dysfunction     Chronic  . Cardiomyopathy     EF 40-45% 2D 6/13  . Prediabetes   . Hyperlipidemia   . Anemia   . Vitamin D deficiency   . Glaucoma     DISCHARGE MEDICATIONS:   Medication List    STOP taking these medications       amLODipine 5 MG tablet  Commonly known as:  NORVASC     AVODART 0.5 MG capsule  Generic drug:  dutasteride     cloNIDine 0.1 MG tablet  Commonly known as:  CATAPRES     isosorbide mononitrate 120 MG 24 hr tablet  Commonly known as:  IMDUR     meloxicam 15 MG tablet  Commonly known as:  MOBIC     metolazone 2.5 MG tablet  Commonly known as:  ZAROXOLYN     metoprolol succinate 50 MG 24 hr tablet  Commonly known as:  TOPROL-XL      TAKE these medications       aspirin EC 325 MG tablet  Take 325 mg by mouth at bedtime.      bimatoprost 0.01 % Soln  Commonly known as:  LUMIGAN  Place 1 drop into both eyes at bedtime.     brinzolamide 1 % ophthalmic suspension  Commonly known as:  AZOPT  Place 1 drop into both eyes 2 (two) times daily.     fexofenadine 180 MG tablet  Commonly known as:  ALLEGRA  Take 180 mg by mouth at bedtime.     finasteride 5 MG tablet  Commonly known as:  PROSCAR  Take 5 mg by mouth at bedtime.     FLUoxetine 20 MG capsule  Commonly known as:  PROZAC  Take 1 capsule (20 mg total) by mouth daily.     furosemide 40 MG tablet  Commonly known as:  LASIX  Take 0.5 tablets (20 mg total) by mouth daily.     GLUCOSAMINE-CHONDROITIN-MSM PO  Take 1 tablet by mouth at bedtime. Glucosamine 1500 mg/ chrondroiton 1200 mg     multivitamin with minerals Tabs tablet  Take 1 tablet by mouth at bedtime.     pantoprazole 20 MG tablet  Commonly known as:  PROTONIX  Take 20 mg by mouth at bedtime.     quinapril 20 MG tablet  Commonly known as:  ACCUPRIL  Take 1 tablet (20 mg total) by  mouth at bedtime.     vitamin B-12 1000 MCG tablet  Commonly known as:  CYANOCOBALAMIN  Take 1,000 mcg by mouth at bedtime.     Vitamin D 2000 UNITS tablet  Take 2,000 Units by mouth at bedtime.        ALLERGIES:   Allergies  Allergen Reactions  . Doxazosin Swelling    BRIEF HPI:  See H&P, Labs, Consult and Test reports for all details in brief,[name] is a 80 y.o. male, with a past medical history of cardiomyopathy with an EF of 40% in 2013, glaucoma, lower extremity edema presented to the ED with Nausea and vomiting. In the emergency department his BP is 79/38, he has acute renal failure with a creatinine of 1.98 (baseline is less than one), hyponatremia (123), and an elevated alk phos of 885.   CONSULTATIONS:   None  PERTINENT RADIOLOGIC STUDIES: US Abdomen Complete  05/16/2014   CLINICAL DATA:  Epigastric pain.  EXAM: ULTRASOUND ABDOMEN COMPLETE  COMPARISON:  None.  FINDINGS:  Gallbladder:  No gallstones or wall thickening visualized. No sonographic Murphy sign noted.  Common bile duct:  Diameter: Measures 5.1 mm which is within normal limits.  Liver:  No focal lesion identified. Within normal limits in parenchymal echogenicity.  IVC:  No abnormality visualized.  Pancreas:  Not visualized due to overlying bowel gas.  Spleen:  Size and appearance within normal limits.  Right Kidney:  Length: 11.2 cm. Large cyst is seen extending from parapelvic region into cortex. Echogenicity within normal limits. No mass or hydronephrosis visualized.  Left Kidney:  Length: 11.2 cm. Echogenicity within normal limits. No mass or hydronephrosis visualized.  Abdominal aorta:  No aneurysm visualized.  Other findings:  None.  IMPRESSION: Right renal cyst. Pancreas not visualized due to overlying bowel gas. No other definite abnormality seen in the abdomen.   Electronically Signed   By: Sabino Dick M.D.   On: 05/16/2014 17:30   Dg Chest Port 1 View  05/16/2014   CLINICAL DATA:  weakness  EXAM: PORTABLE CHEST - 1 VIEW  COMPARISON:  Portable chest dated 03/23/2014.  FINDINGS: Low lung volumes. Cardiac silhouette is enlarged. Lungs are clear. Degenerative changes within the shoulders. No acute osseus abnormalities. Atherosclerotic calcifications in the aorta.  IMPRESSION: No active disease.   Electronically Signed   By: Margaree Mackintosh M.D.   On: 05/16/2014 12:01     PERTINENT LAB RESULTS: CBC:  Recent Labs  05/18/14 0330 05/19/14 0450  WBC 8.8 8.1  HGB 10.5* 10.8*  HCT 29.9* 31.2*  PLT 295 319   CMET CMP     Component Value Date/Time   NA 130* 05/19/2014 0450   K 3.8 05/19/2014 0450   CL 97 05/19/2014 0450   CO2 21 05/19/2014 0450   GLUCOSE 99 05/19/2014 0450   BUN 14 05/19/2014 0450   CREATININE 0.87 05/19/2014 0450   CREATININE 1.35 05/03/2014 1150   CALCIUM 8.3* 05/19/2014 0450   PROT 5.7* 05/20/2014 0525   ALBUMIN 2.1* 05/20/2014 0525   AST 18 05/20/2014 0525   ALT 12 05/20/2014 0525   ALKPHOS 444*  05/20/2014 0525   BILITOT 0.7 05/20/2014 0525   GFRNONAA 73* 05/19/2014 0450   GFRNONAA 45* 05/03/2014 1150   GFRAA 84* 05/19/2014 0450   GFRAA 52* 05/03/2014 1150    GFR Estimated Creatinine Clearance: 58.9 ml/min (by C-G formula based on Cr of 0.87). No results found for this basename: LIPASE, AMYLASE,  in the last 72 hours No results  found for this basename: CKTOTAL, CKMB, CKMBINDEX, TROPONINI,  in the last 72 hours No components found with this basename: POCBNP,  No results found for this basename: DDIMER,  in the last 72 hours No results found for this basename: HGBA1C,  in the last 72 hours No results found for this basename: CHOL, HDL, LDLCALC, TRIG, CHOLHDL, LDLDIRECT,  in the last 72 hours  Recent Labs  05/19/14 0450  T3FREE 2.0*   No results found for this basename: VITAMINB12, FOLATE, FERRITIN, TIBC, IRON, RETICCTPCT,  in the last 72 hours Coags: No results found for this basename: PT, INR,  in the last 72 hours Microbiology: Recent Results (from the past 240 hour(s))  MRSA PCR SCREENING     Status: None   Collection Time    05/16/14  3:00 PM      Result Value Ref Range Status   MRSA by PCR NEGATIVE  NEGATIVE Final   Comment:            The GeneXpert MRSA Assay (FDA     approved for NASAL specimens     only), is one component of a     comprehensive MRSA colonization     surveillance program. It is not     intended to diagnose MRSA     infection nor to guide or     monitor treatment for     MRSA infections.  URINE CULTURE     Status: None   Collection Time    05/17/14 12:39 PM      Result Value Ref Range Status   Specimen Description URINE, RANDOM   Final   Special Requests NONE   Final   Culture  Setup Time     Final   Value: 05/17/2014 19:05     Performed at [facility]     Final   Value: 95,000 COLONIES/ML     Performed at [facility]   Culture     Final   Value: Multiple bacterial morphotypes present, none predominant.  Suggest appropriate recollection if clinically indicated.     Performed at [facility]   Report Status 05/18/2014 FINAL   Final     BRIEF HOSPITAL COURSE:  Hypotension  -resolved  -suspect that this was secondary to hypovolemia from vomiting and poor oral intaker   Vomiting  -etiology uncertain-?viral syndrome  -Lipase within normal limits, troponins negative. Belly soft.  -resolved with supportive care   ARF  -likely pre-renal  -resolved with IVF   Elevated Alkaline Phosphatase  -Abd Ultrasound negative for biliary dilatation, other liver enzymes and Bilirubin negative  - GGT within normal limits, MRI abd neg for biliary or gall bladder pathology, however questionable bone mets to T11 vertebrae, have spoke with Cyndi Lennert over the phone on 05/20/14-have explained above and recommended outpatient work up   Hyponatremia  -chronic issue  -stable at 130-monitor BMET periodically   Chronic Systolic CHF  -EF 09% in 9833 with global hypokinesis-however repeat Echo this admit-shows EF 55-60 percent  -BP stable-will resume ACEI and low dose lasix on discharge  HTN  -c/w low dose Quinapril-and follow BP-resume other usual meds as tolerated   GERD  -c/w PPI   Generalized Weakness  -secondary to deconditioning from acute illness  -SNF on discharge for rehab-family patient agreeable  TODAY-DAY OF DISCHARGE:  Subjective:   Joshua Norton today has no headache,no chest abdominal pain,no new weakness tingling or numbness, feels much better wants to  go home today.   Objective:   Blood pressure 148/75, pulse 83, temperature 97.9 F (36.6 C), temperature source Oral, resp. rate 18, height 5' 8.4" (1.737 m), weight 88.27 kg (194 lb 9.6 oz), SpO2 98.00%.  Intake/Output Summary (Last 24 hours) at 05/20/14 1235 Last data filed at 05/20/14 0937  Gross per 24 hour  Intake      0 ml  Output    600 ml  Net   -600 ml   Filed Weights   05/18/14 2105 05/19/14 0421  05/20/14 0445  Weight: 87.454 kg (192 lb 12.8 oz) 87.4 kg (192 lb 10.9 oz) 88.27 kg (194 lb 9.6 oz)    Exam Awake Alert, Oriented *3, No new F.N deficits, Normal affect [place].AT,PERRAL Supple Neck,No JVD, No cervical lymphadenopathy appriciated.  Symmetrical Chest wall movement, Good air movement bilaterally, CTAB RRR,No Gallops,Rubs or new Murmurs, No Parasternal Heave +ve B.Sounds, Abd Soft, Non tender, No organomegaly appriciated, No rebound -guarding or rigidity. No Cyanosis, Clubbing or edema, No new Rash or bruise  DISCHARGE CONDITION: Stable  DISPOSITION: SNF  DISCHARGE INSTRUCTIONS:    Activity:  As tolerated with Full fall precautions use walker/cane & assistance as needed  Diet recommendation: Heart Healthy diet Fluid restriction 1.5 lit/day Aspiration precautions:yes      Discharge Instructions   (HEART FAILURE PATIENTS) Call MD:  Anytime you have any of the following symptoms: 1) 3 pound weight gain in 24 hours or 5 pounds in 1 week 2) shortness of breath, with or without a dry hacking cough 3) swelling in the hands, feet or stomach 4) if you have to sleep on extra pillows at night in order to breathe.    Complete by:  As directed      Diet - low sodium heart healthy    Complete by:  As directed      Increase activity slowly    Complete by:  As directed            Follow-up Information   Follow up with [name], MD. Schedule an appointment as soon as possible for a visit in 1 week.   Specialty:  Internal Medicine   Contact information:   [address] [place] [address] [phone]       Follow up with Erlene Quan, PA-C. Schedule an appointment as soon as possible for a visit in 2 weeks.   Specialty:  Cardiology   Contact information:   [address] [place] [address] [phone]       Total Time spent on discharge equals 45 minutes.  Signed: Jonetta Osgood 05/20/2014 12:35  PM  **Disclaimer: This note may have been dictated with voice recognition software. Similar sounding words can inadvertently be transcribed and this note may contain transcription errors which may not have been corrected upon publication of note.**

## 2014-05-20 NOTE — Progress Notes (Signed)
Clinical Social Work Department BRIEF PSYCHOSOCIAL ASSESSMENT 05/20/2014  Patient:  KULE, GASCOIGNE     Account Number:  [id number]     Admit date:  05/16/2014  Clinical Social Worker:  Adair Laundry  Date/Time:  05/20/2014 10:00 AM  Referred by:  Physician  Date Referred:  05/20/2014 Referred for  SNF Placement   Other Referral:   Interview type:  Patient Other interview type:    PSYCHOSOCIAL DATA Living Status:  FAMILY Admitted from facility:   Level of care:   Primary support name:  [place] [phone] Primary support relationship to patient:  FAMILY Degree of support available:   Pt has good family support    CURRENT CONCERNS Current Concerns  Post-Acute Placement   Other Concerns:    SOCIAL WORK ASSESSMENT / PLAN CSW notified pt will need ST rehab and is ready for dc today. CSW spoke with pt at bedside and explained PT recommendation. Pt informed CSW that he lives alone but his granddaughter will periodically stay with him and check in while she is not working. Pt is agreeable to ST rehab at SNF. Pt has been to facility in the past but cannot recall name. Pt would be agreeable to dc back to this facility but if CSW cannot find out which facility pt is also open to other options. CSW inquired if any family should be contacted. Pt gave permission for CSW to call his granddaughter. CSW spoke with [name] over the phone and explained recommendation. Pt granddaughter had no concerns as long as pt was agreeable. Pt granddaughter did say she would want to contact pt daughter though to confirm other family members were agreeable as well. CSW did inform that CSW would be sending out referral to all [place] facilities. Both pt and pt daughter were agreeable to this and are aware that plan is for pt to dc today.   Assessment/plan status:  Psychosocial Support/Ongoing Assessment of Needs Other assessment/ plan:   Information/referral to community resources:   SNF list to  be provided with bed offers    PATIENT'S/FAMILY'S RESPONSE TO PLAN OF CARE: Pt is agreeable to [facility] rehab       [place], Franquez

## 2014-05-20 NOTE — Progress Notes (Signed)
CARE MANAGEMENT NOTE 05/20/2014  Patient:  [facility]   Account Number:  [id number]  Date Initiated:  05/17/2014  Documentation initiated by:  Marvetta Gibbons  Subjective/Objective Assessment:   Pt admitted with AKI     Action/Plan:   PTA pt lived at home with family and has [place] services- PT eval recommending SNF- CSW consulted for placement needs   Anticipated DC Date:  05/20/2014   Anticipated DC Plan:  [place]  In-house referral  Clinical Social Worker      DC Planning Services  CM consult      Choice offered to / List presented to:             Status of service:  Completed, signed off Medicare Important Message given?  YES (If response is "NO", the following Medicare IM given date fields will be blank) Date Medicare IM given:  05/18/2014 Date Additional Medicare IM given:  05/20/2014  Discharge Disposition:  [place]  Per UR Regulation:  Reviewed for med. necessity/level of care/duration of stay  If discussed at [place] of Stay Meetings, dates discussed:    Comments:  No NCM needs identified. NCM spoke to pt and states he lives with grand-dtr but she works. He hopes to go home after SNF. Jonnie Finner RN CCM Case Mgmt phone [phone]

## 2014-05-20 NOTE — Progress Notes (Addendum)
Clinical Social Work Department CLINICAL SOCIAL WORK PLACEMENT NOTE 05/20/2014  Patient:  ONDRA, DEBOARD  Account Number:  [id number] Admit date:  05/16/2014  Clinical Social Worker:  Berton Mount, Latanya Presser  Date/time:  05/20/2014 10:30 AM  Clinical Social Work is seeking post-discharge placement for this patient at the following level of care:   [place]   (*CSW will update this form in Epic as items are completed)   05/20/2014  Patient/family provided with [facility] Department of Clinical Social Work's list of facilities offering this level of care within the geographic area requested by the patient (or if unable, by the patient's family).  05/20/2014  Patient/family informed of their freedom to choose among providers that offer the needed level of care, that participate in Medicare, Medicaid or managed care program needed by the patient, have an available bed and are willing to accept the patient.  05/20/2014  Patient/family informed of [facility]' ownership interest in [facility], as well as of the fact that they are under no obligation to receive care at this facility.  PASARR submitted to EDS on 05/20/2014 PASARR number received on 05/20/2014  FL2 transmitted to all facilities in geographic area requested by pt/family on  05/20/2014 FL2 transmitted to all facilities within larger geographic area on   Patient informed that his/her managed care company has contracts with or will negotiate with  certain facilities, including the following:     Patient/family informed of bed offers received:  05/20/2014 Patient chooses bed at [facility] health care Physician recommends and patient chooses bed at    Patient to be transferred to [facility] health care on  05/20/2014 Patient to be transferred to facility by [facility] Patient and family notified of transfer on 05/20/2014 Name of family member notified:  [name]  The following physician request were entered in  Epic:   Additional Comments:   Berton Mount, Al[place]

## 2014-05-20 NOTE — Evaluation (Signed)
Occupational Therapy Evaluation Patient Details Name: Joshua Norton MRN: [id number] DOB: 07/20/1919 Today's Date: 05/20/2014    History of Present Illness 80 y.o. male, with a past medical history of cardiomyopathy with an EF of 40% in 2013, glaucoma, lower extremity edema presents to the ED with Nausea and vomiting.  He vomited clear fluid 3 times and is complaining of intermittent RUQ/ Epigastric abdominal pain.    Clinical Impression   Patient presents during OT evaluation as pleasant elderly gentleman who is eager to get out of bed.  Patient able to clearly articulate his deficits related to this hospitalization and his prior functional level.  Patient will benefit from skilled OT intervention while acutely hospitalized to decrease level of care with basic ADL and functional mobility.  Patient will benefit from continued OT for the same in SNF.      Follow Up Recommendations  SNF    Equipment Recommendations  None recommended by OT    Recommendations for Other Services       Precautions / Restrictions Precautions Precautions: Fall Restrictions Weight Bearing Restrictions: No      Mobility Bed Mobility Overal bed mobility: Needs Assistance Bed Mobility: Supine to Sit     Supine to sit: Max assist     General bed mobility comments: Difficulty abductiong right hip, difficulty with forward weight shift, strong posterior bias  Transfers Overall transfer level: Needs assistance Equipment used: Rolling walker (2 wheeled) Transfers: Stand Pivot Transfers;Sit to/from Stand Sit to Stand: Max assist Stand pivot transfers: Mod assist       General transfer comment: Increased time, difficulty accepting weight onto right leg to move left leg    Balance Overall balance assessment: Needs assistance Sitting-balance support: Single extremity supported Sitting balance-Leahy Scale: Poor   Postural control: Posterior lean Standing balance support: Bilateral upper extremity  supported;During functional activity Standing balance-Leahy Scale: Poor                              ADL Overall ADL's : Needs assistance/impaired         Upper Body Bathing: Minimal assitance   Lower Body Bathing: Maximal assistance   Upper Body Dressing : Minimal assistance   Lower Body Dressing: Total assistance   Toilet Transfer: Maximal assistance;RW;Stand-pivot   Toileting- Clothing Manipulation and Hygiene: Maximal assistance;Sit to/from stand         General ADL Comments: Patient needed an aide to assist with bathing and dressing prior to this admission.  Now with significant LE weakness, pain     Vision                     Perception Perception Perception Tested?: Yes   Praxis Praxis Praxis tested?: Within functional limits    Pertinent Vitals/Pain 5/10 right leg with activity / VSS     Hand Dominance Right   Extremity/Trunk Assessment Upper Extremity Assessment Upper Extremity Assessment: Generalized weakness   Lower Extremity Assessment Lower Extremity Assessment: Generalized weakness   Cervical / Trunk Assessment Cervical / Trunk Assessment: Kyphotic   Communication Communication Communication: HOH   Cognition Arousal/Alertness: Awake/alert Behavior During Therapy: WFL for tasks assessed/performed Overall Cognitive Status: Within Functional Limits for tasks assessed                     General Comments       Exercises       Shoulder Instructions  Home Living Family/patient expects to be discharged to:: Skilled nursing facility                                 Additional Comments: pt lives alone; has aide 2 days a week and granddaughter who stays with him "sometimes"       Prior Functioning/Environment Level of Independence: Needs assistance  Gait / Transfers Assistance Needed: pt recently began ambulating with RW  ADL's / Homemaking Assistance Needed: requires (A) from aide for  bathing and dressing; reports other days of the week he does not change clothes         OT Diagnosis: Generalized weakness;Acute pain   OT Problem List: Decreased strength;Impaired balance (sitting and/or standing);Pain;Decreased range of motion;Decreased safety awareness;Cardiopulmonary status limiting activity;Increased edema;Decreased activity tolerance   OT Treatment/Interventions: Self-care/ADL training;DME and/or AE instruction;Therapeutic activities;Balance training;Therapeutic exercise;Energy conservation;Patient/family education    OT Goals(Current goals can be found in the care plan section) Acute Rehab OT Goals Patient Stated Goal: to get stronger  OT Goal Formulation: With patient Time For Goal Achievement: 06/03/14 Potential to Achieve Goals: Good  OT Frequency: Min 2X/week   Barriers to D/C: Decreased caregiver support          Co-evaluation              End of Session Equipment Utilized During Treatment: Gait belt;Rolling walker  Activity Tolerance: Patient tolerated treatment well Patient left: in chair;with call bell/phone within reach (RN indicates no chair alarm needed)   Time: 1350-1422 OT Time Calculation (min): 32 min Charges:  OT General Charges $OT Visit: 1 Procedure OT Evaluation $Initial OT Evaluation Tier I: 1 Procedure OT Treatments $Self Care/Home Management : 23-37 mins G-Codes:    Mariah Milling 06-03-2014, 2:31 PM

## 2014-05-20 NOTE — Progress Notes (Signed)
Pt to be discharged to [facility]. IV's discontinued, site unremarkable. Tele d/ced and CMT notified. Skin intact. Condom catheter on.  Denies pain or any other complaints. [facility] to be called and transport patient. Will continue to monitor until off unit. [name] D Joshua Norton

## 2014-05-20 NOTE — Progress Notes (Signed)
Physical Therapy Treatment Patient Details Name: Joshua Norton MRN: [id number] DOB: November 29, 1919 Today's Date: 05/20/2014    History of Present Illness 80 y.o. male, with a past medical history of cardiomyopathy with an EF of 40% in 2013, glaucoma, lower extremity edema presents to the ED with Nausea and vomiting.  He vomited clear fluid 3 times and is complaining of intermittent RUQ/ Epigastric abdominal pain.     PT Comments    Patient declined OOB - awaiting transfer to SNF.  Did agree with exercises.  Able to participate.  Follow Up Recommendations  SNF;Supervision/Assistance - 24 hour     Equipment Recommendations  None recommended by PT    Recommendations for Other Services       Precautions / Restrictions Precautions Precautions: Fall Restrictions Weight Bearing Restrictions: No    Mobility  Bed Mobility Overal bed mobility: Needs Assistance Bed Mobility: Supine to Sit     Supine to sit: Max assist     General bed mobility comments: Difficulty abductiong right hip, difficulty with forward weight shift, strong posterior bias  Transfers Overall transfer level: Needs assistance Equipment used: Rolling walker (2 wheeled) Transfers: Stand Pivot Transfers;Sit to/from Stand Sit to Stand: Max assist Stand pivot transfers: Mod assist       General transfer comment: Increased time, difficulty accepting weight onto right leg to move left leg  Ambulation/Gait                 Stairs            Wheelchair Mobility    Modified Rankin (Stroke Patients Only)       Balance Overall balance assessment: Needs assistance Sitting-balance support: Single extremity supported Sitting balance-Leahy Scale: Poor   Postural control: Posterior lean Standing balance support: Bilateral upper extremity supported;During functional activity Standing balance-Leahy Scale: Poor                      Cognition Arousal/Alertness: Awake/alert Behavior During  Therapy: WFL for tasks assessed/performed Overall Cognitive Status: Within Functional Limits for tasks assessed                      Exercises General Exercises - Upper Extremity Shoulder Flexion: AROM;Left;AAROM;Right;10 reps;Supine General Exercises - Lower Extremity Ankle Circles/Pumps: AROM;Both;10 reps;Supine Quad Sets: AROM;Both;10 reps;Supine Heel Slides: AAROM;Both;10 reps;Supine Hip ABduction/ADduction: AAROM;Both;10 reps;Supine    General Comments        Pertinent Vitals/Pain     Home Living Family/patient expects to be discharged to:: Skilled nursing facility               Additional Comments: pt lives alone; has aide 2 days a week and granddaughter who stays with him "sometimes"     Prior Function Level of Independence: Needs assistance  Gait / Transfers Assistance Needed: pt recently began ambulating with RW  ADL's / Homemaking Assistance Needed: requires (A) from aide for bathing and dressing; reports other days of the week he does not change clothes      PT Goals (current goals can now be found in the care plan section) Acute Rehab PT Goals Patient Stated Goal: to get stronger  Progress towards PT goals: Progressing toward goals    Frequency  Min 2X/week    PT Plan Current plan remains appropriate    Co-evaluation             End of Session   Activity Tolerance: Patient tolerated treatment well Patient left: in bed;with call bell/phone  within reach;with bed alarm set     Time: [phone] PT Time Calculation (min): 11 min  Charges:  $Therapeutic Exercise: 8-22 mins                    G Codes:      Despina Pole 06/14/2014, 4:18 PM Carita Pian. Sanjuana Kava, [place] Pager [phone]

## 2014-05-22 LAB — ALKALINE PHOSPHATASE ISOENZYMES
Alkaline phosphatase (APISO): 807 U/L — ABNORMAL HIGH (ref 40–115)
Bone %: 92 % — ABNORMAL HIGH (ref 28–66)
INTESTINAL %: 0 % — AB (ref 1–24)
Liver %: 8 % — ABNORMAL LOW (ref 25–69)
MACROHEPATIC ISOENZYMES: 0 %

## 2014-05-30 ENCOUNTER — Encounter: Payer: Self-pay | Admitting: Cardiology

## 2014-05-30 ENCOUNTER — Telehealth: Payer: Self-pay | Admitting: Cardiology

## 2014-05-31 NOTE — Telephone Encounter (Signed)
Closed encounter °

## 2014-06-15 ENCOUNTER — Emergency Department (HOSPITAL_COMMUNITY)
Admission: EM | Admit: 2014-06-15 | Discharge: 2014-06-16 | Disposition: A | Discharge status: Home / Self Care | Payer: Medicare HMO | Attending: Emergency Medicine | Admitting: Emergency Medicine

## 2014-06-15 ENCOUNTER — Encounter (HOSPITAL_COMMUNITY): Payer: Self-pay | Admitting: Emergency Medicine

## 2014-06-15 DIAGNOSIS — D649 Anemia, unspecified: Secondary | ICD-10-CM | POA: Insufficient documentation

## 2014-06-15 DIAGNOSIS — M1990 Unspecified osteoarthritis, unspecified site: Secondary | ICD-10-CM | POA: Insufficient documentation

## 2014-06-15 DIAGNOSIS — E559 Vitamin D deficiency, unspecified: Secondary | ICD-10-CM | POA: Insufficient documentation

## 2014-06-15 DIAGNOSIS — H409 Unspecified glaucoma: Secondary | ICD-10-CM | POA: Insufficient documentation

## 2014-06-15 DIAGNOSIS — Z79899 Other long term (current) drug therapy: Secondary | ICD-10-CM | POA: Insufficient documentation

## 2014-06-15 DIAGNOSIS — Z7982 Long term (current) use of aspirin: Secondary | ICD-10-CM | POA: Insufficient documentation

## 2014-06-15 DIAGNOSIS — I10 Essential (primary) hypertension: Secondary | ICD-10-CM | POA: Insufficient documentation

## 2014-06-15 DIAGNOSIS — R319 Hematuria, unspecified: Secondary | ICD-10-CM | POA: Insufficient documentation

## 2014-06-15 NOTE — ED Provider Notes (Addendum)
CSN: [id number]     Arrival date & time 06/15/14  2218 History   First MD Initiated Contact with Patient 06/15/14 2302     Chief Complaint  Patient presents with  . Hematuria     (Consider location/radiation/quality/duration/timing/severity/associated sxs/prior Treatment) HPI Comments: H/o turp, here with blood clots from his penis. Denies any dysuria. No flank pain or fever. Symptoms occur spontaneously and nothing makes it better or worse. No treatment used prior to arrival.  Patient is a 80 y.o. male presenting with hematuria. The history is provided by the patient.  Hematuria This is a recurrent problem. The current episode started 1 to 2 hours ago. The problem occurs constantly. The problem has not changed since onset.Pertinent negatives include no shortness of breath. Nothing aggravates the symptoms. Nothing relieves the symptoms. He has tried nothing for the symptoms.    Past Medical History  Diagnosis Date  . Edema of both legs 06/09/2012    2D Echo - EF 40-45, left ventricle cavity moderately dilated  . HTN (hypertension), benign     On multiple medications  . Osteoarthritis (arthritis due to wear and tear of joints)     S/p L Knee Arthroplasty  . Diastolic dysfunction     Chronic  . Cardiomyopathy     EF 40-45% 2D 6/13  . Prediabetes   . Hyperlipidemia   . Anemia   . Vitamin D deficiency   . Glaucoma    Past Surgical History  Procedure Laterality Date  . Left knee arthroplasty  09/2003  . Transurethral resection of prostate    . Cataract extraction    . Cervical laminectomy     Family History  Problem Relation Age of Onset  . Breast cancer Mother   . Stroke Brother    History  Substance Use Topics  . Smoking status: Never Smoker   . Smokeless tobacco: Never Used  . Alcohol Use: No    Review of Systems  Respiratory: Negative for shortness of breath.   Genitourinary: Positive for hematuria.  All other systems reviewed and are negative.     Allergies   Doxazosin  Home Medications   Prior to Admission medications   Medication Sig Start Date End Date Taking? Authorizing Provider  aspirin EC 325 MG tablet Take 325 mg by mouth at bedtime.    Historical Provider, MD  bimatoprost (LUMIGAN) 0.01 % SOLN Place 1 drop into both eyes at bedtime.    Historical Provider, MD  brinzolamide (AZOPT) 1 % ophthalmic suspension Place 1 drop into both eyes 2 (two) times daily.    Historical Provider, MD  Cholecalciferol (VITAMIN D) 2000 UNITS tablet Take 2,000 Units by mouth at bedtime.    Historical Provider, MD  fexofenadine (ALLEGRA) 180 MG tablet Take 180 mg by mouth at bedtime.     Historical Provider, MD  finasteride (PROSCAR) 5 MG tablet Take 5 mg by mouth at bedtime.    Historical Provider, MD  FLUoxetine (PROZAC) 20 MG capsule Take 1 capsule (20 mg total) by mouth daily. 05/20/14   Shanker Kristeen Mans, MD  furosemide (LASIX) 40 MG tablet Take 0.5 tablets (20 mg total) by mouth daily. 05/20/14   Shanker Kristeen Mans, MD  GLUCOSAMINE-CHONDROITIN-MSM PO Take 1 tablet by mouth at bedtime. Glucosamine 1500 mg/ chrondroiton 1200 mg    Historical Provider, MD  Multiple Vitamin (MULTIVITAMIN WITH MINERALS) TABS tablet Take 1 tablet by mouth at bedtime.    Historical Provider, MD  pantoprazole (PROTONIX) 20 MG tablet Take 20  mg by mouth at bedtime.     Historical Provider, MD  quinapril (ACCUPRIL) 20 MG tablet Take 1 tablet (20 mg total) by mouth at bedtime. 05/20/14   Shanker Kristeen Mans, MD  vitamin B-12 (CYANOCOBALAMIN) 1000 MCG tablet Take 1,000 mcg by mouth at bedtime.    Historical Provider, MD   BP 127/65  Pulse 78  Temp(Src) 97.6 F (36.4 C) (Oral)  Resp 16  SpO2 98% Physical Exam  Nursing note and vitals reviewed. Constitutional: He is oriented to person, place, and time. He appears well-developed and well-nourished.  Non-toxic appearance. No distress.  HENT:  Head: Normocephalic and atraumatic.  Eyes: Conjunctivae, EOM and lids are normal. Pupils are  equal, round, and reactive to light.  Neck: Normal range of motion. Neck supple. No tracheal deviation present. No mass present.  Cardiovascular: Normal rate, regular rhythm and normal heart sounds.  Exam reveals no gallop.   No murmur heard. Pulmonary/Chest: Effort normal and breath sounds normal. No stridor. No respiratory distress. He has no decreased breath sounds. He has no wheezes. He has no rhonchi. He has no rales.  Abdominal: Soft. Normal appearance and bowel sounds are normal. He exhibits no distension. There is no tenderness. There is no rigidity, no rebound, no guarding and no CVA tenderness.  No suprapubic tenderness noted  Genitourinary:  Blood loss noted at tip of penis  Musculoskeletal: Normal range of motion. He exhibits no edema and no tenderness.  Neurological: He is alert and oriented to person, place, and time. No cranial nerve deficit. GCS eye subscore is 4. GCS verbal subscore is 5. GCS motor subscore is 6.  Skin: Skin is warm and dry. No abrasion and no rash noted.  Psychiatric: He has a normal mood and affect. His speech is normal and behavior is normal. His affect is not blunt.    ED Course  Procedures (including critical care time) Labs Review Labs Reviewed  URINALYSIS, ROUTINE W REFLEX MICROSCOPIC - Abnormal; Notable for the following:    Color, Urine RED (*)    APPearance CLOUDY (*)    Hgb urine dipstick LARGE (*)    Protein, ur 30 (*)    Leukocytes, UA SMALL (*)    All other components within normal limits  CBC WITH DIFFERENTIAL - Abnormal; Notable for the following:    Hemoglobin 12.6 (*)    HCT 35.3 (*)    Platelets 412 (*)    All other components within normal limits  BASIC METABOLIC PANEL - Abnormal; Notable for the following:    Sodium 125 (*)    Chloride 86 (*)    Glucose, Bld 116 (*)    GFR calc non Af Amer 70 (*)    GFR calc Af Amer 81 (*)    Anion gap 19 (*)    All other components within normal limits  URINE MICROSCOPIC-ADD ON     Imaging Review No results found.   EKG Interpretation None      MDM   Final diagnoses:  None    Patient's bladder irrigated with saline. A Foley catheter left in patient and he is to followup with his urologist.  Review of patient's record shows that patient's hyponatremia is chronic in nature.  Leota Jacobsen, MD 06/16/14 0300  Leota Jacobsen, MD 06/16/14 [phone]

## 2014-06-15 NOTE — ED Notes (Signed)
Bed: NB56 Expected date:  Expected time:  Means of arrival:  Comments: EMS 80yo

## 2014-06-15 NOTE — ED Notes (Signed)
Pt states he started bleeding from penis,  Bright and dark red with clots.  Pt is alert and oriented in NAD

## 2014-06-16 LAB — BASIC METABOLIC PANEL
Anion gap: 19 — ABNORMAL HIGH (ref 5–15)
BUN: 17 mg/dL (ref 6–23)
CO2: 20 mEq/L (ref 19–32)
Calcium: 9.3 mg/dL (ref 8.4–10.5)
Chloride: 86 mEq/L — ABNORMAL LOW (ref 96–112)
Creatinine, Ser: 0.96 mg/dL (ref 0.50–1.35)
GFR calc non Af Amer: 70 mL/min — ABNORMAL LOW (ref >90)
GFR, EST AFRICAN AMERICAN: 81 mL/min — AB (ref >90)
Glucose, Bld: 116 mg/dL — ABNORMAL HIGH (ref 70–99)
POTASSIUM: 4.2 meq/L (ref 3.7–5.3)
SODIUM: 125 meq/L — AB (ref 137–147)

## 2014-06-16 LAB — CBC WITH DIFFERENTIAL/PLATELET
BASOS PCT: 0 % (ref 0–1)
Basophils Absolute: 0 10*3/uL (ref 0.0–0.1)
Eosinophils Absolute: 0 10*3/uL (ref 0.0–0.7)
Eosinophils Relative: 0 % (ref 0–5)
HCT: 35.3 % — ABNORMAL LOW (ref 39.0–52.0)
Hemoglobin: 12.6 g/dL — ABNORMAL LOW (ref 13.0–17.0)
LYMPHS ABS: 1.5 10*3/uL (ref 0.7–4.0)
Lymphocytes Relative: 20 % (ref 12–46)
MCH: 29 pg (ref 26.0–34.0)
MCHC: 35.7 g/dL (ref 30.0–36.0)
MCV: 81.3 fL (ref 78.0–100.0)
Monocytes Absolute: 0.8 10*3/uL (ref 0.1–1.0)
Monocytes Relative: 10 % (ref 3–12)
Neutro Abs: 5.3 10*3/uL (ref 1.7–7.7)
Neutrophils Relative %: 70 % (ref 43–77)
PLATELETS: 412 10*3/uL — AB (ref 150–400)
RBC: 4.34 MIL/uL (ref 4.22–5.81)
RDW: 14.1 % (ref 11.5–15.5)
WBC: 7.6 10*3/uL (ref 4.0–10.5)

## 2014-06-16 LAB — URINALYSIS, ROUTINE W REFLEX MICROSCOPIC
Bilirubin Urine: NEGATIVE
Glucose, UA: NEGATIVE mg/dL
Ketones, ur: NEGATIVE mg/dL
Nitrite: NEGATIVE
PROTEIN: 30 mg/dL — AB
Specific Gravity, Urine: 1.011 (ref 1.005–1.030)
Urobilinogen, UA: 1 mg/dL (ref 0.0–1.0)
pH: 6.5 (ref 5.0–8.0)

## 2014-06-16 LAB — URINE MICROSCOPIC-ADD ON

## 2014-06-16 NOTE — Discharge Instructions (Signed)
Followup with urology on Monday about your Foley catheter Hematuria, Adult Hematuria is blood in your urine. It can be caused by a bladder infection, kidney infection, prostate infection, kidney stone, or cancer of your urinary tract. Infections can usually be treated with medicine, and a kidney stone usually will pass through your urine. If neither of these is the cause of your hematuria, further workup to find out the reason may be needed. It is very important that you tell your health care provider about any blood you see in your urine, even if the blood stops without treatment or happens without causing pain. Blood in your urine that happens and then stops and then happens again can be a symptom of a very serious condition. Also, pain is not a symptom in the initial stages of many urinary cancers. HOME CARE INSTRUCTIONS   Drink lots of fluid, 3-4 quarts a day. If you have been diagnosed with an infection, cranberry juice is especially recommended, in addition to large amounts of water.  Avoid caffeine, tea, and carbonated beverages, because they tend to irritate the bladder.  Avoid alcohol because it may irritate the prostate.  Only take over-the-counter or prescription medicines for pain, discomfort, or fever as directed by your health care provider.  If you have been diagnosed with a kidney stone, follow your health care provider's instructions regarding straining your urine to catch the stone.  Empty your bladder often. Avoid holding urine for long periods of time.  After a bowel movement, women should cleanse front to back. Use each tissue only once.  Empty your bladder before and after sexual intercourse if you are a male. SEEK MEDICAL CARE IF: You develop back pain, fever, a feeling of sickness in your stomach (nausea), or vomiting or if your symptoms are not better in 3 days. Return sooner if you are getting worse. SEEK IMMEDIATE MEDICAL CARE IF:   You have a persistent fever,  with a temperature of 101.70F (38.8C) or greater.  You develop severe vomiting and are unable to keep the medicine down.  You develop severe back or abdominal pain despite taking your medicines.  You begin passing a large amount of blood or clots in your urine.  You feel extremely weak or faint, or you pass out. MAKE SURE YOU:   Understand these instructions.  Will watch your condition.  Will get help right away if you are not doing well or get worse. Document Released: 11/29/2005 Document Revised: 09/19/2013 Document Reviewed: 07/30/2013 [facility] Patient Information 2015 [place], [place]. This information is not intended to replace advice given to you by your health care provider. Make sure you discuss any questions you have with your health care provider. Foley Catheter Care, Adult A Foley catheter is a soft, flexible tube that is placed into the bladder to drain urine. A Foley catheter may be inserted if:  You leak urine or are not able to control when you urinate (urinary incontinence).  You are not able to urinate when you need to (urinary retention).  You had prostate surgery or surgery on the genitals.  You have certain medical conditions, such as multiple sclerosis, dementia, or a spinal cord injury. If you are going home with a Foley catheter in place, follow the instructions below. TAKING CARE OF THE CATHETER 1. Wash your hands with soap and water. 2. Using mild soap and warm water on a clean washcloth:  Clean the area on your body closest to the catheter insertion site using a circular motion,  moving away from the catheter. Never wipe toward the catheter because this could sweep bacteria up into the urethra and cause infection.  Remove all traces of soap. Pat the area dry with a clean towel. For males, reposition the foreskin. 3. Attach the catheter to your leg so there is no tension on the catheter. Use adhesive tape or a leg strap. If you are using adhesive tape,  remove any sticky residue left behind by the previous tape you used. 4. Keep the drainage bag below the level of the bladder, but keep it off the floor. 5. Check throughout the day to be sure the catheter is working and urine is draining freely. Make sure the tubing does not become kinked. 6. Do not pull on the catheter or try to remove it. Pulling could damage internal tissues. TAKING CARE OF THE DRAINAGE BAGS You will be given two drainage bags to take home. One is a large overnight drainage bag, and the other is a smaller leg bag that fits underneath clothing. You may wear the overnight bag at any time, but you should never wear the smaller leg bag at night. Follow the instructions below for how to empty, change, and clean your drainage bags. Emptying the Drainage Bag You must empty your drainage bag when it is  - full or at least 2-3 times a day. 1. Wash your hands with soap and water. 2. Keep the drainage bag below your hips, below the level of your bladder. This stops urine from going back into the tubing and into your bladder. 3. Hold the dirty bag over the toilet or a clean container. 4. Open the pour spout at the bottom of the bag and empty the urine into the toilet or container. Do not let the pour spout touch the toilet, container, or any other surface. Doing so can place bacteria on the bag, which can cause an infection. 5. Clean the pour spout with a gauze pad or cotton ball that has rubbing alcohol on it. 6. Close the pour spout. 7. Attach the bag to your leg with adhesive tape or a leg strap. 8. Wash your hands well. Changing the Drainage Bag Change your drainage bag once a month or sooner if it starts to smell bad or look dirty. Below are steps to follow when changing the drainage bag. 1. Wash your hands with soap and water. 2. Pinch off the rubber catheter so that urine does not spill out. 3. Disconnect the catheter tube from the drainage tube at the connection valve. Do not let  the tubes touch any surface. 4. Clean the end of the catheter tube with an alcohol wipe. Use a different alcohol wipe to clean the end of the drainage tube. 5. Connect the catheter tube to the drainage tube of the clean drainage bag. 6. Attach the new bag to the leg with adhesive tape or a leg strap. Avoid attaching the new bag too tightly. 7. Wash your hands well. Cleaning the Drainage Bag 1. Wash your hands with soap and water. 2. Wash the bag in warm, soapy water. 3. Rinse the bag thoroughly with warm water. 4. Fill the bag with a solution of white vinegar and water (1 cup vinegar to 1 qt warm water [.2 L vinegar to 1 L warm water]). Close the bag and soak it for 30 minutes in the solution. 5. Rinse the bag with warm water. 6. Hang the bag to dry with the pour spout open and hanging downward. 7.  Store the clean bag (once it is dry) in a clean plastic bag. 8. Wash your hands well. PREVENTING INFECTION  Wash your hands before and after handling your catheter.  Take showers daily and wash the area where the catheter enters your body. Do not take baths. Replace wet leg straps with dry ones, if this applies.  Do not use powders, sprays, or lotions on the genital area. Only use creams, lotions, or ointments as directed by your caregiver.  For females, wipe from front to back after each bowel movement.  Drink enough fluids to keep your urine clear or pale yellow unless you have a fluid restriction.  Do not let the drainage bag or tubing touch or lie on the floor.  Wear cotton underwear to absorb moisture and to keep your skin drier. SEEK MEDICAL CARE IF:   Your urine is cloudy or smells unusually bad.  Your catheter becomes clogged.  You are not draining urine into the bag or your bladder feels full.  Your catheter starts to leak. SEEK IMMEDIATE MEDICAL CARE IF:   You have pain, swelling, redness, or pus where the catheter enters the body.  You have pain in the abdomen, legs,  lower back, or bladder.  You have a fever.  You see blood fill the catheter, or your urine is pink or red.  You have nausea, vomiting, or chills.  Your catheter gets pulled out. MAKE SURE YOU:   Understand these instructions.  Will watch your condition.  Will get help right away if you are not doing well or get worse. Document Released: 11/29/2005 Document Revised: 03/26/2013 Document Reviewed: 11/20/2012 [facility] Patient Information 2015 [place], [place]. This information is not intended to replace advice given to you by your health care provider. Make sure you discuss any questions you have with your health care provider.

## 2014-06-16 NOTE — ED Notes (Signed)
Pt's bladder was irrigated and blood clots resolved and urine resolved to ting of pink,  Flow was good,  Prior to discharge 3 way foley left in and leg bag attached,  Cap put on 3 way

## 2014-06-16 NOTE — ED Notes (Signed)
Urine catheter irrigation stopped and bag emptied to try and retrieve a urine sample

## 2014-06-21 DIAGNOSIS — I428 Other cardiomyopathies: Secondary | ICD-10-CM

## 2014-06-21 DIAGNOSIS — M6281 Muscle weakness (generalized): Secondary | ICD-10-CM

## 2014-06-21 DIAGNOSIS — I5042 Chronic combined systolic (congestive) and diastolic (congestive) heart failure: Secondary | ICD-10-CM

## 2014-06-21 DIAGNOSIS — I509 Heart failure, unspecified: Secondary | ICD-10-CM

## 2014-06-22 ENCOUNTER — Emergency Department (HOSPITAL_COMMUNITY): Payer: Medicare HMO

## 2014-06-22 ENCOUNTER — Encounter (HOSPITAL_COMMUNITY): Payer: Self-pay | Admitting: Emergency Medicine

## 2014-06-22 ENCOUNTER — Inpatient Hospital Stay (HOSPITAL_COMMUNITY)
Admission: EM | Admit: 2014-06-22 | Discharge: 2014-06-27 | DRG: 871 | Disposition: A | Discharge status: Home Health | Payer: Medicare HMO | Attending: Internal Medicine | Admitting: Internal Medicine

## 2014-06-22 DIAGNOSIS — I428 Other cardiomyopathies: Secondary | ICD-10-CM

## 2014-06-22 DIAGNOSIS — I10 Essential (primary) hypertension: Secondary | ICD-10-CM | POA: Diagnosis present

## 2014-06-22 DIAGNOSIS — Z96659 Presence of unspecified artificial knee joint: Secondary | ICD-10-CM

## 2014-06-22 DIAGNOSIS — T8389XA Other specified complication of genitourinary prosthetic devices, implants and grafts, initial encounter: Secondary | ICD-10-CM | POA: Diagnosis present

## 2014-06-22 DIAGNOSIS — K219 Gastro-esophageal reflux disease without esophagitis: Secondary | ICD-10-CM | POA: Diagnosis present

## 2014-06-22 DIAGNOSIS — E872 Acidosis, unspecified: Secondary | ICD-10-CM | POA: Diagnosis present

## 2014-06-22 DIAGNOSIS — L89609 Pressure ulcer of unspecified heel, unspecified stage: Secondary | ICD-10-CM | POA: Diagnosis present

## 2014-06-22 DIAGNOSIS — R5383 Other fatigue: Secondary | ICD-10-CM

## 2014-06-22 DIAGNOSIS — I959 Hypotension, unspecified: Secondary | ICD-10-CM | POA: Diagnosis not present

## 2014-06-22 DIAGNOSIS — M25519 Pain in unspecified shoulder: Secondary | ICD-10-CM | POA: Diagnosis present

## 2014-06-22 DIAGNOSIS — R339 Retention of urine, unspecified: Secondary | ICD-10-CM | POA: Diagnosis not present

## 2014-06-22 DIAGNOSIS — N368 Other specified disorders of urethra: Secondary | ICD-10-CM | POA: Diagnosis present

## 2014-06-22 DIAGNOSIS — L8990 Pressure ulcer of unspecified site, unspecified stage: Secondary | ICD-10-CM | POA: Diagnosis present

## 2014-06-22 DIAGNOSIS — E2749 Other adrenocortical insufficiency: Secondary | ICD-10-CM | POA: Diagnosis not present

## 2014-06-22 DIAGNOSIS — F0390 Unspecified dementia without behavioral disturbance: Secondary | ICD-10-CM | POA: Diagnosis present

## 2014-06-22 DIAGNOSIS — A419 Sepsis, unspecified organism: Principal | ICD-10-CM

## 2014-06-22 DIAGNOSIS — Z9181 History of falling: Secondary | ICD-10-CM

## 2014-06-22 DIAGNOSIS — E876 Hypokalemia: Secondary | ICD-10-CM | POA: Diagnosis not present

## 2014-06-22 DIAGNOSIS — Z7982 Long term (current) use of aspirin: Secondary | ICD-10-CM

## 2014-06-22 DIAGNOSIS — H409 Unspecified glaucoma: Secondary | ICD-10-CM

## 2014-06-22 DIAGNOSIS — N390 Urinary tract infection, site not specified: Secondary | ICD-10-CM

## 2014-06-22 DIAGNOSIS — E782 Mixed hyperlipidemia: Secondary | ICD-10-CM

## 2014-06-22 DIAGNOSIS — N179 Acute kidney failure, unspecified: Secondary | ICD-10-CM | POA: Diagnosis present

## 2014-06-22 DIAGNOSIS — R6521 Severe sepsis with septic shock: Secondary | ICD-10-CM | POA: Diagnosis present

## 2014-06-22 DIAGNOSIS — I509 Heart failure, unspecified: Secondary | ICD-10-CM | POA: Diagnosis present

## 2014-06-22 DIAGNOSIS — N342 Other urethritis: Secondary | ICD-10-CM | POA: Diagnosis present

## 2014-06-22 DIAGNOSIS — M949 Disorder of cartilage, unspecified: Secondary | ICD-10-CM

## 2014-06-22 DIAGNOSIS — I5042 Chronic combined systolic (congestive) and diastolic (congestive) heart failure: Secondary | ICD-10-CM | POA: Diagnosis present

## 2014-06-22 DIAGNOSIS — R748 Abnormal levels of other serum enzymes: Secondary | ICD-10-CM

## 2014-06-22 DIAGNOSIS — R001 Bradycardia, unspecified: Secondary | ICD-10-CM

## 2014-06-22 DIAGNOSIS — E871 Hypo-osmolality and hyponatremia: Secondary | ICD-10-CM

## 2014-06-22 DIAGNOSIS — Y846 Urinary catheterization as the cause of abnormal reaction of the patient, or of later complication, without mention of misadventure at the time of the procedure: Secondary | ICD-10-CM | POA: Diagnosis present

## 2014-06-22 DIAGNOSIS — E785 Hyperlipidemia, unspecified: Secondary | ICD-10-CM | POA: Diagnosis present

## 2014-06-22 DIAGNOSIS — R5381 Other malaise: Secondary | ICD-10-CM

## 2014-06-22 DIAGNOSIS — R972 Elevated prostate specific antigen [PSA]: Secondary | ICD-10-CM | POA: Diagnosis present

## 2014-06-22 DIAGNOSIS — M898X9 Other specified disorders of bone, unspecified site: Secondary | ICD-10-CM

## 2014-06-22 DIAGNOSIS — G9340 Encephalopathy, unspecified: Secondary | ICD-10-CM | POA: Diagnosis present

## 2014-06-22 DIAGNOSIS — D638 Anemia in other chronic diseases classified elsewhere: Secondary | ICD-10-CM | POA: Diagnosis present

## 2014-06-22 DIAGNOSIS — E2740 Unspecified adrenocortical insufficiency: Secondary | ICD-10-CM

## 2014-06-22 DIAGNOSIS — R7309 Other abnormal glucose: Secondary | ICD-10-CM | POA: Diagnosis present

## 2014-06-22 DIAGNOSIS — E559 Vitamin D deficiency, unspecified: Secondary | ICD-10-CM | POA: Diagnosis present

## 2014-06-22 DIAGNOSIS — M899 Disorder of bone, unspecified: Secondary | ICD-10-CM | POA: Diagnosis present

## 2014-06-22 DIAGNOSIS — R319 Hematuria, unspecified: Secondary | ICD-10-CM | POA: Diagnosis not present

## 2014-06-22 DIAGNOSIS — R6520 Severe sepsis without septic shock: Secondary | ICD-10-CM

## 2014-06-22 DIAGNOSIS — Z79899 Other long term (current) drug therapy: Secondary | ICD-10-CM

## 2014-06-22 DIAGNOSIS — R7303 Prediabetes: Secondary | ICD-10-CM

## 2014-06-22 DIAGNOSIS — R6510 Systemic inflammatory response syndrome (SIRS) of non-infectious origin without acute organ dysfunction: Secondary | ICD-10-CM | POA: Diagnosis present

## 2014-06-22 DIAGNOSIS — I420 Dilated cardiomyopathy: Secondary | ICD-10-CM

## 2014-06-22 DIAGNOSIS — I493 Ventricular premature depolarization: Secondary | ICD-10-CM

## 2014-06-22 LAB — CBC WITH DIFFERENTIAL/PLATELET
Basophils Absolute: 0 10*3/uL (ref 0.0–0.1)
Basophils Relative: 0 % (ref 0–1)
Eosinophils Absolute: 0.1 10*3/uL (ref 0.0–0.7)
Eosinophils Relative: 1 % (ref 0–5)
HCT: 32 % — ABNORMAL LOW (ref 39.0–52.0)
Hemoglobin: 11.5 g/dL — ABNORMAL LOW (ref 13.0–17.0)
LYMPHS ABS: 1.3 10*3/uL (ref 0.7–4.0)
LYMPHS PCT: 10 % — AB (ref 12–46)
MCH: 28.8 pg (ref 26.0–34.0)
MCHC: 35.9 g/dL (ref 30.0–36.0)
MCV: 80 fL (ref 78.0–100.0)
Monocytes Absolute: 1.4 10*3/uL — ABNORMAL HIGH (ref 0.1–1.0)
Monocytes Relative: 11 % (ref 3–12)
NEUTROS PCT: 78 % — AB (ref 43–77)
Neutro Abs: 10.6 10*3/uL — ABNORMAL HIGH (ref 1.7–7.7)
PLATELETS: 377 10*3/uL (ref 150–400)
RBC: 4 MIL/uL — AB (ref 4.22–5.81)
RDW: 14.6 % (ref 11.5–15.5)
WBC: 13.3 10*3/uL — AB (ref 4.0–10.5)

## 2014-06-22 LAB — URINALYSIS, ROUTINE W REFLEX MICROSCOPIC
Glucose, UA: NEGATIVE mg/dL
KETONES UR: NEGATIVE mg/dL
NITRITE: NEGATIVE
Protein, ur: 30 mg/dL — AB
Specific Gravity, Urine: 1.015 (ref 1.005–1.030)
Urobilinogen, UA: 2 mg/dL — ABNORMAL HIGH (ref 0.0–1.0)
pH: 6 (ref 5.0–8.0)

## 2014-06-22 LAB — BASIC METABOLIC PANEL
Anion gap: 17 — ABNORMAL HIGH (ref 5–15)
BUN: 59 mg/dL — ABNORMAL HIGH (ref 6–23)
CO2: 22 meq/L (ref 19–32)
Calcium: 8.2 mg/dL — ABNORMAL LOW (ref 8.4–10.5)
Chloride: 86 mEq/L — ABNORMAL LOW (ref 96–112)
Creatinine, Ser: 1.48 mg/dL — ABNORMAL HIGH (ref 0.50–1.35)
GFR calc Af Amer: 46 mL/min — ABNORMAL LOW (ref >90)
GFR, EST NON AFRICAN AMERICAN: 39 mL/min — AB (ref >90)
Glucose, Bld: 111 mg/dL — ABNORMAL HIGH (ref 70–99)
POTASSIUM: 4 meq/L (ref 3.7–5.3)
SODIUM: 125 meq/L — AB (ref 137–147)

## 2014-06-22 LAB — URINE MICROSCOPIC-ADD ON

## 2014-06-22 LAB — TROPONIN I

## 2014-06-22 MED ORDER — DEXTROSE 5 % IV SOLN
1.0000 g | Freq: Once | INTRAVENOUS | Status: AC
  Administered 2014-06-22: 1 g via INTRAVENOUS
  Filled 2014-06-22: qty 10

## 2014-06-22 MED ORDER — SODIUM CHLORIDE 0.9 % IV SOLN: INTRAVENOUS | Status: DC

## 2014-06-22 MED ORDER — SODIUM CHLORIDE 0.9 % IV BOLUS (SEPSIS)
1000.0000 mL | Freq: Once | INTRAVENOUS | Status: AC
  Administered 2014-06-22: 1000 mL via INTRAVENOUS

## 2014-06-22 MED ORDER — SODIUM CHLORIDE 0.9 % IV SOLN: Freq: Once | INTRAVENOUS | Status: DC

## 2014-06-22 NOTE — ED Notes (Signed)
Per EMS: Pt reports he was recently in Rehab for shoulder injury. Pt had fall at rehab making injury worse. Pt has had severe pain in his shoulder all week, family want him to be evaluated. Pt given 100 mcg of Fentanyl en route. Vitals signs stable. Ax4. NAD

## 2014-06-22 NOTE — ED Provider Notes (Signed)
CSN: [id number]     Arrival date & time 06/22/14  1934 History   First MD Initiated Contact with Patient 06/22/14 1951     Chief Complaint  Patient presents with  . Shoulder Pain     (Consider location/radiation/quality/duration/timing/severity/associated sxs/prior Treatment) HPI Comments: Patient from home with pain in his right shoulder. Family states he had a fall on July 4 and did not have the shoulder evaluated. He was seen in the ED by record for hematuria and was sent home with a Foley catheter. Patient has had pain in his shoulder he says for quite a while. He is a poor historian. He complains of pain in the back of his head and neck. Denies any focal weakness, numbness or tingling. No chest pain or shortness of breath. No abdominal pain, nausea or vomiting.  The history is provided by the patient.    Past Medical History  Diagnosis Date  . Edema of both legs 06/09/2012    2D Echo - EF 40-45, left ventricle cavity moderately dilated  . HTN (hypertension), benign     On multiple medications  . Osteoarthritis (arthritis due to wear and tear of joints)     S/p L Knee Arthroplasty  . Diastolic dysfunction     Chronic  . Cardiomyopathy     EF 40-45% 2D 6/13  . Prediabetes   . Hyperlipidemia   . Anemia   . Vitamin D deficiency   . Glaucoma    Past Surgical History  Procedure Laterality Date  . Left knee arthroplasty  09/2003  . Transurethral resection of prostate    . Cataract extraction    . Cervical laminectomy     Family History  Problem Relation Age of Onset  . Breast cancer Mother   . Stroke Brother    History  Substance Use Topics  . Smoking status: Never Smoker   . Smokeless tobacco: Never Used  . Alcohol Use: No    Review of Systems  Unable to perform ROS: Dementia      Allergies  Doxazosin  Home Medications   Prior to Admission medications   Medication Sig Start Date End Date Taking? Authorizing Provider  aspirin EC 325 MG tablet Take 325 mg  by mouth at bedtime.   Yes Historical Provider, MD  bimatoprost (LUMIGAN) 0.01 % SOLN Place 1 drop into both eyes at bedtime.   Yes Historical Provider, MD  brinzolamide (AZOPT) 1 % ophthalmic suspension Place 1 drop into both eyes 2 (two) times daily.   Yes Historical Provider, MD  Cholecalciferol (VITAMIN D) 2000 UNITS tablet Take 2,000 Units by mouth at bedtime.   Yes Historical Provider, MD  fexofenadine (ALLEGRA) 180 MG tablet Take 180 mg by mouth at bedtime.    Yes Historical Provider, MD  finasteride (PROSCAR) 5 MG tablet Take 5 mg by mouth at bedtime.   Yes Historical Provider, MD  FLUoxetine (PROZAC) 20 MG capsule Take 20 mg by mouth daily.   Yes Historical Provider, MD  furosemide (LASIX) 40 MG tablet Take 20 mg by mouth daily.   Yes Historical Provider, MD  GLUCOSAMINE-CHONDROITIN-MSM PO Take 1 tablet by mouth at bedtime. Glucosamine 1500 mg/ chrondroiton 1200 mg   Yes Historical Provider, MD  Multiple Vitamin (MULTIVITAMIN WITH MINERALS) TABS tablet Take 1 tablet by mouth at bedtime.   Yes Historical Provider, MD  pantoprazole (PROTONIX) 20 MG tablet Take 20 mg by mouth at bedtime.    Yes Historical Provider, MD  quinapril (ACCUPRIL) 20 MG tablet  Take 20 mg by mouth at bedtime.   Yes Historical Provider, MD  vitamin B-12 (CYANOCOBALAMIN) 1000 MCG tablet Take 1,000 mcg by mouth at bedtime.   Yes Historical Provider, MD   BP 137/56  Pulse 81  Temp(Src) 98.2 F (36.8 C) (Oral)  Resp 11  SpO2 100% Physical Exam  Nursing note and vitals reviewed. Constitutional: He appears well-developed and well-nourished. No distress.  HENT:  Head: Normocephalic and atraumatic.  Mouth/Throat: Oropharynx is clear and moist. No oropharyngeal exudate.  Eyes: Conjunctivae and EOM are normal. Pupils are equal, round, and reactive to light.  Neck: Normal range of motion. Neck supple.  Diffuse paraspinal C spine pain  Cardiovascular: Normal rate, regular rhythm, normal heart sounds and intact distal  pulses.   No murmur heard. Pulmonary/Chest: Effort normal and breath sounds normal. No respiratory distress.  Abdominal: Soft. There is no tenderness. There is no rebound and no guarding.  Foley catheter in place  Genitourinary:  See photo.  Foul smelling ulceration with central necrosis  Musculoskeletal: He exhibits tenderness. He exhibits no edema.  TTP R anterior shoulder.  Reduced ROM.  Intact radial pulse. Cardinal hand movements intact.  Neurological: He is alert. No cranial nerve deficit. He exhibits normal muscle tone. Coordination normal.  No ataxia on finger to nose bilaterally. No pronator drift. 5/5 strength throughout. CN 2-12 intact.\. Equal grip strength. Sensation intact.\  Skin: Skin is warm.  Psychiatric: He has a normal mood and affect. His behavior is normal.      ED Course  Procedures (including critical care time) Labs Review Labs Reviewed  CBC WITH DIFFERENTIAL - Abnormal; Notable for the following:    WBC 13.3 (*)    RBC 4.00 (*)    Hemoglobin 11.5 (*)    HCT 32.0 (*)    Neutrophils Relative % 78 (*)    Neutro Abs 10.6 (*)    Lymphocytes Relative 10 (*)    Monocytes Absolute 1.4 (*)    All other components within normal limits  BASIC METABOLIC PANEL - Abnormal; Notable for the following:    Sodium 125 (*)    Chloride 86 (*)    Glucose, Bld 111 (*)    BUN 59 (*)    Creatinine, Ser 1.48 (*)    Calcium 8.2 (*)    GFR calc non Af Amer 39 (*)    GFR calc Af Amer 46 (*)    Anion gap 17 (*)    All other components within normal limits  URINALYSIS, ROUTINE W REFLEX MICROSCOPIC - Abnormal; Notable for the following:    Color, Urine AMBER (*)    APPearance TURBID (*)    Hgb urine dipstick LARGE (*)    Bilirubin Urine SMALL (*)    Protein, ur 30 (*)    Urobilinogen, UA 2.0 (*)    Leukocytes, UA LARGE (*)    All other components within normal limits  URINE MICROSCOPIC-ADD ON - Abnormal; Notable for the following:    Bacteria, UA MANY (*)    All other  components within normal limits  URINE CULTURE  TROPONIN I  I-STAT CG4 LACTIC ACID, ED    Imaging Review Dg Chest 2 View  06/22/2014   CLINICAL DATA:  Shoulder pain.  Cardiomyopathy.  EXAM: CHEST  2 VIEW  COMPARISON:  05/16/2014  FINDINGS: Stable cardiomegaly and mediastinal contours. Lateral view limited by the patient's arm thickening rays. Pulmonary vascularity is normal. The lungs are normally expanded and clear. No airspace disease or pleural effusion.  Negative for pneumothorax. 3 mm radiopaque foreign body projects over the soft tissues of the left neck on the frontal view. No acute osseous abnormality identified. Please note that the majority of the right shoulder is excluded from this image.  IMPRESSION: Stable mild cardiomegaly.  No acute findings in the chest.   Electronically Signed   By: Curlene Dolphin M.D.   On: 06/22/2014 22:01   Dg Shoulder Right  06/22/2014   CLINICAL DATA:  Right shoulder pain and limited range of motion after a fall.  EXAM: RIGHT SHOULDER - 2+ VIEW  COMPARISON:  03/23/2014  FINDINGS: Degenerative changes in the glenohumeral and acromioclavicular joints. Decreased subacromial space consistent with chronic rotator cuff arthropathy. No evidence of acute fracture or dislocation of the right shoulder.  IMPRESSION: Degenerative changes in the right shoulder with probable chronic rotator cuff arthropathy. No acute bony abnormalities identified 3   Electronically Signed   By: [name] M.D.   On: 06/22/2014 22:02   Ct Head Wo Contrast  06/22/2014   CLINICAL DATA:  Neck pain after a fall.  EXAM: CT HEAD WITHOUT CONTRAST  CT CERVICAL SPINE WITHOUT CONTRAST  TECHNIQUE: Multidetector CT imaging of the head and cervical spine was performed following the standard protocol without intravenous contrast. Multiplanar CT image reconstructions of the cervical spine were also generated.  COMPARISON:  None.  FINDINGS: CT HEAD FINDINGS  Prominent diffuse cerebral atrophy. Ventricular  dilatation is likely due to central atrophy. Low-attenuation changes in the deep white matter consistent with small vessel ischemia. No mass effect or midline shift. No abnormal extra-axial fluid collections. Gray-white matter junctions are distinct. Basal cisterns are not effaced. No evidence of acute intracranial hemorrhage. No depressed skull fractures. Visualized paranasal sinuses and mastoid air cells are not opacified.  CT CERVICAL SPINE FINDINGS  Straightening of the usual cervical lordosis which may be due to patient positioning but ligamentous injury or muscle spasm can also have this appearance. There is diffuse degenerative change throughout the cervical spine with diffuse narrowing of cervical interspaces and associated endplate hypertrophic changes. Prominent disc osteophyte complexes at C3-4, C4-5, C5-6, and C6-7 levels. C1-2 articulation appears intact. There is diffuse heterogeneous sclerosis and lucency demonstrated throughout the cervical spine and in the visualized upper thoracic vertebra. This is likely to indicate diffuse bone metastasis. Renal osteodystrophy, metabolic abnormalities, or severe osteoporosis could also potentially have this appearance. Correlation with PSA is recommended.  IMPRESSION: No acute intracranial abnormalities. Chronic atrophy and small vessel ischemic changes.  Nonspecific straightening of the usual cervical lordosis. Diffuse degenerative change throughout the cervical spine. Heterogeneous lytic and sclerotic changes throughout the visualized spine worrisome for bone metastasis.   Electronically Signed   By: [name] M.D.   On: 06/22/2014 22:19   Ct Cervical Spine Wo Contrast  06/22/2014   CLINICAL DATA:  Neck pain after a fall.  EXAM: CT HEAD WITHOUT CONTRAST  CT CERVICAL SPINE WITHOUT CONTRAST  TECHNIQUE: Multidetector CT imaging of the head and cervical spine was performed following the standard protocol without intravenous contrast. Multiplanar CT image  reconstructions of the cervical spine were also generated.  COMPARISON:  None.  FINDINGS: CT HEAD FINDINGS  Prominent diffuse cerebral atrophy. Ventricular dilatation is likely due to central atrophy. Low-attenuation changes in the deep white matter consistent with small vessel ischemia. No mass effect or midline shift. No abnormal extra-axial fluid collections. Gray-white matter junctions are distinct. Basal cisterns are not effaced. No evidence of acute intracranial hemorrhage. No depressed skull fractures.  Visualized paranasal sinuses and mastoid air cells are not opacified.  CT CERVICAL SPINE FINDINGS  Straightening of the usual cervical lordosis which may be due to patient positioning but ligamentous injury or muscle spasm can also have this appearance. There is diffuse degenerative change throughout the cervical spine with diffuse narrowing of cervical interspaces and associated endplate hypertrophic changes. Prominent disc osteophyte complexes at C3-4, C4-5, C5-6, and C6-7 levels. C1-2 articulation appears intact. There is diffuse heterogeneous sclerosis and lucency demonstrated throughout the cervical spine and in the visualized upper thoracic vertebra. This is likely to indicate diffuse bone metastasis. Renal osteodystrophy, metabolic abnormalities, or severe osteoporosis could also potentially have this appearance. Correlation with PSA is recommended.  IMPRESSION: No acute intracranial abnormalities. Chronic atrophy and small vessel ischemic changes.  Nonspecific straightening of the usual cervical lordosis. Diffuse degenerative change throughout the cervical spine. Heterogeneous lytic and sclerotic changes throughout the visualized spine worrisome for bone metastasis.   Electronically Signed   By: [name] M.D.   On: 06/22/2014 22:19   Dg Humerus Right  06/22/2014   CLINICAL DATA:  Right shoulder pain and limited range of motion after a fall.  EXAM: RIGHT HUMERUS - 2+ VIEW  COMPARISON:  Right  shoulder 03/23/2014  FINDINGS: Degenerative changes in the right shoulder. No evidence of acute fracture or dislocation of the humerus. Soft tissues are unremarkable.  IMPRESSION: No acute bony abnormalities.   Electronically Signed   By: [name] M.D.   On: 06/22/2014 22:11     EKG Interpretation   Date/Time:  Saturday June 22 2014 22:02:00 EDT Ventricular Rate:  81 PR Interval:  159 QRS Duration: 136 QT Interval:  418 QTC Calculation: 485 R Axis:   -58 Text Interpretation:  Sinus rhythm Multiform ventricular premature  complexes Left bundle branch block Nonspecific ST and T wave abnormality  Confirmed by Tomicka Lover  MD, Lashayla Armes [phone]) on 06/22/2014 10:04:59 PM      MDM   Final diagnoses:  Urinary tract infection without hematuria, site unspecified  Sepsis, due to unspecified organism   One week of atraumatic shoulder pain. Had a fall a week ago. Foley in place from July 4 draining bloody urine. It is unclear patient fell again since July 4. CT head and C-spine negative. Questionable bone metastases on spine.  X-rays shoulders negative without acute fracture.  Patient with leukocytosis and foul-smelling drainage from penis area of questionable necrosis. Hyponatremia with worsening renal function as well.  Gentle hydration started.  Penile wound discussed with Dr. Janice Norrie.  He feels this is likely secondary to catheter use. recommend discontinuing catheter and observing. Will follow.  Infected appearing UA. Rocephin started, culture sent. Blood pressure remained soft in the 80-90s.  Admission d.w Dr. Laren Everts. BP improved at time of admission.  Lactate normal.  BP 137/56  Pulse 81  Temp(Src) 98.2 F (36.8 C) (Oral)  Resp 11  SpO2 100%   CRITICAL CARE Performed by: Ezequiel Essex Total critical care time: 30 Critical care time was exclusive of separately billable procedures and treating other patients. Critical care was necessary to treat or prevent imminent or  life-threatening deterioration. Critical care was time spent personally by me on the following activities: development of treatment plan with patient and/or surrogate as well as nursing, discussions with consultants, evaluation of patient's response to treatment, examination of patient, obtaining history from patient or surrogate, ordering and performing treatments and interventions, ordering and review of laboratory studies, ordering and review of radiographic studies, pulse oximetry and  re-evaluation of patient's condition.    Ezequiel Essex, MD 06/23/14 [phone]

## 2014-06-22 NOTE — H&P (Signed)
[facility]                                                                                    Patient Demographics  Joshua Norton, is a 80 y.o. male  CSN: [id number]  MRN: [id number]  DOB - 12-Jun-1919  Admit Date - 06/22/2014  Outpatient Primary MD for the patient is [name], MD   With History of -  Past Medical History  Diagnosis Date  . Edema of both legs 06/09/2012    2D Echo - EF 40-45, left ventricle cavity moderately dilated  . HTN (hypertension), benign     On multiple medications  . Osteoarthritis (arthritis due to wear and tear of joints)     S/p L Knee Arthroplasty  . Diastolic dysfunction     Chronic  . Cardiomyopathy     EF 40-45% 2D 6/13  . Prediabetes   . Hyperlipidemia   . Anemia   . Vitamin D deficiency   . Glaucoma       Past Surgical History  Procedure Laterality Date  . Left knee arthroplasty  09/2003  . Transurethral resection of prostate    . Cataract extraction    . Cervical laminectomy      in for   Chief Complaint  Patient presents with  . Shoulder Pain     HPI  Joshua Norton  is a 80 y.o. male, with past medical history significant for hypertension and cardiomyopathy and who lives at home presented to the emergency today for evaluation of right shoulder pain which is chronic. In the emergency room he was noted to have low blood pressure and a urinary tract infection. Patient has a cognitive disorder and cannot give a significant history. He denies chest pains or shortness of breath nausea vomiting or diarrhea. Patient has a chronic Foley and it was removed in the emergency room after he was noted to have an area of necrosis on the glans of his penis and urology was consulted. Patient was also noted to be hyponatremic with worsening of his renal function     Review of Systems    In addition to the HPI above,  No Fever-chills, No Headache, No problems swallowing food or Liquids, No Chest pain, Cough or  Shortness of Breath, No Abdominal pain, No Nausea or Vommitting,  No dysuria, No new skin rashes or bruises, No new joints pains-aches except for the right shoulder pain which is chronic No new weakness, tingling, numbness in any extremity, No recent weight gain or loss, No significant Mental Stressors.  A full 10 point Review of Systems was done, except as stated above, all other Review of Systems were negative.   Social History History  Substance Use Topics  . Smoking status: Never Smoker   . Smokeless tobacco: Never Used  . Alcohol Use: No   To  Family History Family History  Problem Relation Age of Onset  . Breast cancer Mother   . Stroke Brother      Prior to Admission medications   Medication Sig Start Date End Date Taking? Authorizing Provider  aspirin EC 325 MG tablet Take 325 mg by mouth at bedtime.  Yes Historical Provider, MD  bimatoprost (LUMIGAN) 0.01 % SOLN Place 1 drop into both eyes at bedtime.   Yes Historical Provider, MD  brinzolamide (AZOPT) 1 % ophthalmic suspension Place 1 drop into both eyes 2 (two) times daily.   Yes Historical Provider, MD  Cholecalciferol (VITAMIN D) 2000 UNITS tablet Take 2,000 Units by mouth at bedtime.   Yes Historical Provider, MD  fexofenadine (ALLEGRA) 180 MG tablet Take 180 mg by mouth at bedtime.    Yes Historical Provider, MD  finasteride (PROSCAR) 5 MG tablet Take 5 mg by mouth at bedtime.   Yes Historical Provider, MD  FLUoxetine (PROZAC) 20 MG capsule Take 20 mg by mouth daily.   Yes Historical Provider, MD  furosemide (LASIX) 40 MG tablet Take 20 mg by mouth daily.   Yes Historical Provider, MD  GLUCOSAMINE-CHONDROITIN-MSM PO Take 1 tablet by mouth at bedtime. Glucosamine 1500 mg/ chrondroiton 1200 mg   Yes Historical Provider, MD  Multiple Vitamin (MULTIVITAMIN WITH MINERALS) TABS tablet Take 1 tablet by mouth at bedtime.   Yes Historical Provider, MD  pantoprazole (PROTONIX) 20 MG tablet Take 20 mg by mouth at  bedtime.    Yes Historical Provider, MD  quinapril (ACCUPRIL) 20 MG tablet Take 20 mg by mouth at bedtime.   Yes Historical Provider, MD  vitamin B-12 (CYANOCOBALAMIN) 1000 MCG tablet Take 1,000 mcg by mouth at bedtime.   Yes Historical Provider, MD    Allergies  Allergen Reactions  . Doxazosin Swelling    Physical Exam  Vitals  Blood pressure 91/53, pulse 80, temperature 98.2 F (36.8 C), temperature source Oral, resp. rate 16, SpO2 100.00%.   1. General elderly African American gentleman in no acute distress  2. to Not Suicidal or Homicidal, confused.  3. No gross F.N deficits,   4. Ears and Eyes appear Normal, Conjunctivae clear, pupils 2 mm sluggishly reactive to light. Moist Oral Mucosa.  5. Supple Neck, No JVD, No cervical lymphadenopathy appriciated, No Carotid Bruits.  6. Symmetrical Chest wall movement, bilateral basal crackles.  7. RRR with occasional PVCs, No Gallops, Rubs or Murmurs, No Parasternal Heave.  8. Positive Bowel Sounds, Abdomen Soft, Non tender, No organomegaly appriciated,No rebound -guarding or rigidity.  9.  No Cyanosis, Normal Skin Turgor .  10. Good muscle tone,  joints appear normal , no effusions, Normal ROM.  11. Genitalia: An area of necrosis noted on the glans with a blood clot at the opening of his penis    Data Review  CBC  Recent Labs Lab 06/16/14 0357 06/22/14 2008  WBC 7.6 13.3*  HGB 12.6* 11.5*  HCT 35.3* 32.0*  PLT 412* 377  MCV 81.3 80.0  MCH 29.0 28.8  MCHC 35.7 35.9  RDW 14.1 14.6  LYMPHSABS 1.5 1.3  MONOABS 0.8 1.4*  EOSABS 0.0 0.1  BASOSABS 0.0 0.0   ------------------------------------------------------------------------------------------------------------------  Chemistries   Recent Labs Lab 06/16/14 0357 06/22/14 2008  NA 125* 125*  K 4.2 4.0  CL 86* 86*  CO2 20 22  GLUCOSE 116* 111*  BUN 17 59*  CREATININE 0.96 1.48*  CALCIUM 9.3 8.2*    ------------------------------------------------------------------------------------------------------------------ CrCl is unknown because both a height and weight (above a minimum accepted value) are required for this calculation. ------------------------------------------------------------------------------------------------------------------ No results found for this basename: TSH, T4TOTAL, FREET3, T3FREE, THYROIDAB,  in the last 72 hours   Coagulation profile No results found for this basename: INR, PROTIME,  in the last 168 hours ------------------------------------------------------------------------------------------------------------------- No results found for this basename: DDIMER,  in the last 72 hours -------------------------------------------------------------------------------------------------------------------  Cardiac Enzymes  Recent Labs Lab 06/22/14 2008  TROPONINI <0.30   ------------------------------------------------------------------------------------------------------------------ No components found with this basename: POCBNP,    ---------------------------------------------------------------------------------------------------------------  Urinalysis    Component Value Date/Time   COLORURINE AMBER* 06/22/2014 2252   APPEARANCEUR TURBID* 06/22/2014 2252   LABSPEC 1.015 06/22/2014 2252   PHURINE 6.0 06/22/2014 2252   GLUCOSEU NEGATIVE 06/22/2014 2252   HGBUR LARGE* 06/22/2014 2252   BILIRUBINUR SMALL* 06/22/2014 2252   KETONESUR NEGATIVE 06/22/2014 2252   PROTEINUR 30* 06/22/2014 2252   UROBILINOGEN 2.0* 06/22/2014 2252   NITRITE NEGATIVE 06/22/2014 2252   LEUKOCYTESUR LARGE* 06/22/2014 2252    ----------------------------------------------------------------------------------------------------------------  .   Imaging results:   Dg Chest 2 View  06/22/2014   CLINICAL DATA:  Shoulder pain.  Cardiomyopathy.  EXAM: CHEST  2 VIEW  COMPARISON:  05/16/2014   FINDINGS: Stable cardiomegaly and mediastinal contours. Lateral view limited by the patient's arm thickening rays. Pulmonary vascularity is normal. The lungs are normally expanded and clear. No airspace disease or pleural effusion. Negative for pneumothorax. 3 mm radiopaque foreign body projects over the soft tissues of the left neck on the frontal view. No acute osseous abnormality identified. Please note that the majority of the right shoulder is excluded from this image.  IMPRESSION: Stable mild cardiomegaly.  No acute findings in the chest.   Electronically Signed   By: Curlene Dolphin M.D.   On: 06/22/2014 22:01   Dg Shoulder Right  06/22/2014   CLINICAL DATA:  Right shoulder pain and limited range of motion after a fall.  EXAM: RIGHT SHOULDER - 2+ VIEW  COMPARISON:  03/23/2014  FINDINGS: Degenerative changes in the glenohumeral and acromioclavicular joints. Decreased subacromial space consistent with chronic rotator cuff arthropathy. No evidence of acute fracture or dislocation of the right shoulder.  IMPRESSION: Degenerative changes in the right shoulder with probable chronic rotator cuff arthropathy. No acute bony abnormalities identified 3   Electronically Signed   By: [name] M.D.   On: 06/22/2014 22:02   Ct Head Wo Contrast  06/22/2014   CLINICAL DATA:  Neck pain after a fall.  EXAM: CT HEAD WITHOUT CONTRAST  CT CERVICAL SPINE WITHOUT CONTRAST  TECHNIQUE: Multidetector CT imaging of the head and cervical spine was performed following the standard protocol without intravenous contrast. Multiplanar CT image reconstructions of the cervical spine were also generated.  COMPARISON:  None.  FINDINGS: CT HEAD FINDINGS  Prominent diffuse cerebral atrophy. Ventricular dilatation is likely due to central atrophy. Low-attenuation changes in the deep white matter consistent with small vessel ischemia. No mass effect or midline shift. No abnormal extra-axial fluid collections. Gray-white matter junctions  are distinct. Basal cisterns are not effaced. No evidence of acute intracranial hemorrhage. No depressed skull fractures. Visualized paranasal sinuses and mastoid air cells are not opacified.  CT CERVICAL SPINE FINDINGS  Straightening of the usual cervical lordosis which may be due to patient positioning but ligamentous injury or muscle spasm can also have this appearance. There is diffuse degenerative change throughout the cervical spine with diffuse narrowing of cervical interspaces and associated endplate hypertrophic changes. Prominent disc osteophyte complexes at C3-4, C4-5, C5-6, and C6-7 levels. C1-2 articulation appears intact. There is diffuse heterogeneous sclerosis and lucency demonstrated throughout the cervical spine and in the visualized upper thoracic vertebra. This is likely to indicate diffuse bone metastasis. Renal osteodystrophy, metabolic abnormalities, or severe osteoporosis could also potentially have this appearance. Correlation with PSA is recommended.  IMPRESSION:  No acute intracranial abnormalities. Chronic atrophy and small vessel ischemic changes.  Nonspecific straightening of the usual cervical lordosis. Diffuse degenerative change throughout the cervical spine. Heterogeneous lytic and sclerotic changes throughout the visualized spine worrisome for bone metastasis.   Electronically Signed   By: [name] M.D.   On: 06/22/2014 22:19   Ct Cervical Spine Wo Contrast  06/22/2014   CLINICAL DATA:  Neck pain after a fall.  EXAM: CT HEAD WITHOUT CONTRAST  CT CERVICAL SPINE WITHOUT CONTRAST  TECHNIQUE: Multidetector CT imaging of the head and cervical spine was performed following the standard protocol without intravenous contrast. Multiplanar CT image reconstructions of the cervical spine were also generated.  COMPARISON:  None.  FINDINGS: CT HEAD FINDINGS  Prominent diffuse cerebral atrophy. Ventricular dilatation is likely due to central atrophy. Low-attenuation changes in the deep  white matter consistent with small vessel ischemia. No mass effect or midline shift. No abnormal extra-axial fluid collections. Gray-white matter junctions are distinct. Basal cisterns are not effaced. No evidence of acute intracranial hemorrhage. No depressed skull fractures. Visualized paranasal sinuses and mastoid air cells are not opacified.  CT CERVICAL SPINE FINDINGS  Straightening of the usual cervical lordosis which may be due to patient positioning but ligamentous injury or muscle spasm can also have this appearance. There is diffuse degenerative change throughout the cervical spine with diffuse narrowing of cervical interspaces and associated endplate hypertrophic changes. Prominent disc osteophyte complexes at C3-4, C4-5, C5-6, and C6-7 levels. C1-2 articulation appears intact. There is diffuse heterogeneous sclerosis and lucency demonstrated throughout the cervical spine and in the visualized upper thoracic vertebra. This is likely to indicate diffuse bone metastasis. Renal osteodystrophy, metabolic abnormalities, or severe osteoporosis could also potentially have this appearance. Correlation with PSA is recommended.  IMPRESSION: No acute intracranial abnormalities. Chronic atrophy and small vessel ischemic changes.  Nonspecific straightening of the usual cervical lordosis. Diffuse degenerative change throughout the cervical spine. Heterogeneous lytic and sclerotic changes throughout the visualized spine worrisome for bone metastasis.   Electronically Signed   By: [name] M.D.   On: 06/22/2014 22:19   Dg Humerus Right  06/22/2014   CLINICAL DATA:  Right shoulder pain and limited range of motion after a fall.  EXAM: RIGHT HUMERUS - 2+ VIEW  COMPARISON:  Right shoulder 03/23/2014  FINDINGS: Degenerative changes in the right shoulder. No evidence of acute fracture or dislocation of the humerus. Soft tissues are unremarkable.  IMPRESSION: No acute bony abnormalities.   Electronically Signed   By:  [name] M.D.   On: 06/22/2014 22:11    My personal review of EKG: Rhythm NSR, rate 81 beats per minute with left bundle branch block and multiform  PVCs    Assessment & Plan  1. SIRS with hypotension and acute renal insufficiency     IV Zosyn     IV fluids 2. UTI/chronic Foley     Continue with IV antibiotics 3, area of necrosis on the glans penis     To be evaluated by urology in am 4. Cardiomyopathy with congestive heart failure ejection fraction 45-50% 5. Hyponatremia     Continue with normal saline and fluid restriction by mouth    DVT Prophylaxis Lovenox  AM Labs Ordered, also please review Full Orders  Code Status full  Disposition Plan: Nursing home  Time spent in minutes : 32 minutes  Condition GUARDED   @SIGNATURE @

## 2014-06-22 NOTE — ED Notes (Signed)
Per family, pt has a catheter in place, that the nursing home denies placing. Per family, the catheter is bleeding and has an odor to it.

## 2014-06-22 NOTE — ED Notes (Signed)
Pt has returned from radiology.  

## 2014-06-22 NOTE — ED Notes (Signed)
Joshua Norton(Grandson): [phone] [name](Granddaughter): [phone]

## 2014-06-22 NOTE — ED Notes (Signed)
Pt remains in the radiology. This RN spoke with Gene in radiology, and states that the pt is almost finished in x-ray and will be transported to [place].

## 2014-06-22 NOTE — ED Notes (Signed)
Rancour, MD at bedside. 

## 2014-06-22 NOTE — ED Notes (Addendum)
Pt has an area of blanched tissue with a black spot on the tip of his penis. There also appears to be blood coming from the urethra. There is an odor coming from the pt's penis. Foley catheter inserted at [facility] ED intact. Drainage bag changed.

## 2014-06-23 DIAGNOSIS — R6521 Severe sepsis with septic shock: Secondary | ICD-10-CM

## 2014-06-23 DIAGNOSIS — A419 Sepsis, unspecified organism: Secondary | ICD-10-CM

## 2014-06-23 LAB — CBC WITH DIFFERENTIAL/PLATELET
BASOS ABS: 0 10*3/uL (ref 0.0–0.1)
Basophils Relative: 0 % (ref 0–1)
EOS PCT: 0 % (ref 0–5)
Eosinophils Absolute: 0 10*3/uL (ref 0.0–0.7)
HCT: 28.8 % — ABNORMAL LOW (ref 39.0–52.0)
Hemoglobin: 10.2 g/dL — ABNORMAL LOW (ref 13.0–17.0)
LYMPHS ABS: 1.2 10*3/uL (ref 0.7–4.0)
Lymphocytes Relative: 4 % — ABNORMAL LOW (ref 12–46)
MCH: 29.2 pg (ref 26.0–34.0)
MCHC: 35.4 g/dL (ref 30.0–36.0)
MCV: 82.5 fL (ref 78.0–100.0)
MONO ABS: 1.5 10*3/uL — AB (ref 0.1–1.0)
Monocytes Relative: 5 % (ref 3–12)
Neutro Abs: 27.1 10*3/uL — ABNORMAL HIGH (ref 1.7–7.7)
Neutrophils Relative %: 91 % — ABNORMAL HIGH (ref 43–77)
PLATELETS: 347 10*3/uL (ref 150–400)
RBC: 3.49 MIL/uL — ABNORMAL LOW (ref 4.22–5.81)
RDW: 14.9 % (ref 11.5–15.5)
WBC: 29.8 10*3/uL — ABNORMAL HIGH (ref 4.0–10.5)

## 2014-06-23 LAB — CBC
HEMATOCRIT: 33.4 % — AB (ref 39.0–52.0)
HEMOGLOBIN: 11.7 g/dL — AB (ref 13.0–17.0)
MCH: 28.5 pg (ref 26.0–34.0)
MCHC: 35 g/dL (ref 30.0–36.0)
MCV: 81.3 fL (ref 78.0–100.0)
Platelets: 333 10*3/uL (ref 150–400)
RBC: 4.11 MIL/uL — AB (ref 4.22–5.81)
RDW: 14.8 % (ref 11.5–15.5)
WBC: 13.2 10*3/uL — AB (ref 4.0–10.5)

## 2014-06-23 LAB — BASIC METABOLIC PANEL
ANION GAP: 21 — AB (ref 5–15)
BUN: 58 mg/dL — ABNORMAL HIGH (ref 6–23)
CHLORIDE: 88 meq/L — AB (ref 96–112)
CO2: 18 meq/L — AB (ref 19–32)
Calcium: 8.5 mg/dL (ref 8.4–10.5)
Creatinine, Ser: 1.38 mg/dL — ABNORMAL HIGH (ref 0.50–1.35)
GFR calc Af Amer: 50 mL/min — ABNORMAL LOW (ref >90)
GFR calc non Af Amer: 43 mL/min — ABNORMAL LOW (ref >90)
Glucose, Bld: 100 mg/dL — ABNORMAL HIGH (ref 70–99)
POTASSIUM: 4 meq/L (ref 3.7–5.3)
Sodium: 127 mEq/L — ABNORMAL LOW (ref 137–147)

## 2014-06-23 LAB — HEPATIC FUNCTION PANEL
ALBUMIN: 2 g/dL — AB (ref 3.5–5.2)
ALT: 13 U/L (ref 0–53)
AST: 20 U/L (ref 0–37)
Alkaline Phosphatase: 314 U/L — ABNORMAL HIGH (ref 39–117)
BILIRUBIN TOTAL: 0.7 mg/dL (ref 0.3–1.2)
Bilirubin, Direct: 0.3 mg/dL (ref 0.0–0.3)
Indirect Bilirubin: 0.4 mg/dL (ref 0.3–0.9)
Total Protein: 5.6 g/dL — ABNORMAL LOW (ref 6.0–8.3)

## 2014-06-23 LAB — MRSA PCR SCREENING: MRSA by PCR: NEGATIVE

## 2014-06-23 LAB — CORTISOL: Cortisol, Plasma: 26.1 ug/dL

## 2014-06-23 LAB — PROTIME-INR
INR: 1.52 — ABNORMAL HIGH (ref 0.00–1.49)
PROTHROMBIN TIME: 18.3 s — AB (ref 11.6–15.2)

## 2014-06-23 LAB — LACTIC ACID, PLASMA: Lactic Acid, Venous: 1.2 mmol/L (ref 0.5–2.2)

## 2014-06-23 LAB — PROCALCITONIN: Procalcitonin: 5.61 ng/mL

## 2014-06-23 MED ORDER — PANTOPRAZOLE SODIUM 20 MG PO TBEC
20.0000 mg | DELAYED_RELEASE_TABLET | Freq: Every day | ORAL | Status: DC
  Administered 2014-06-23 – 2014-06-26 (×5): 20 mg via ORAL
  Filled 2014-06-23 (×7): qty 1

## 2014-06-23 MED ORDER — SODIUM CHLORIDE 0.9 % IV BOLUS (SEPSIS)
1000.0000 mL | Freq: Once | INTRAVENOUS | Status: AC
  Administered 2014-06-23: 1000 mL via INTRAVENOUS

## 2014-06-23 MED ORDER — VANCOMYCIN HCL IN DEXTROSE 1-5 GM/200ML-% IV SOLN
1000.0000 mg | INTRAVENOUS | Status: DC
  Filled 2014-06-23: qty 200

## 2014-06-23 MED ORDER — ZOLPIDEM TARTRATE 5 MG PO TABS
5.0000 mg | ORAL_TABLET | Freq: Every evening | ORAL | Status: DC | PRN
  Administered 2014-06-25 – 2014-06-26 (×2): 5 mg via ORAL
  Filled 2014-06-23 (×2): qty 1

## 2014-06-23 MED ORDER — SODIUM CHLORIDE 0.9 % IV BOLUS (SEPSIS)
500.0000 mL | Freq: Once | INTRAVENOUS | Status: AC
  Administered 2014-06-23: 500 mL via INTRAVENOUS

## 2014-06-23 MED ORDER — BRINZOLAMIDE 1 % OP SUSP
1.0000 [drp] | Freq: Two times a day (BID) | OPHTHALMIC | Status: DC
  Administered 2014-06-23 – 2014-06-27 (×9): 1 [drp] via OPHTHALMIC
  Filled 2014-06-23: qty 10

## 2014-06-23 MED ORDER — ENOXAPARIN SODIUM 30 MG/0.3ML ~~LOC~~ SOLN
30.0000 mg | Freq: Every day | SUBCUTANEOUS | Status: DC
  Administered 2014-06-23: 30 mg via SUBCUTANEOUS
  Filled 2014-06-23 (×2): qty 0.3

## 2014-06-23 MED ORDER — ACETAMINOPHEN 650 MG RE SUPP: 650.0000 mg | Freq: Four times a day (QID) | RECTAL | Status: DC | PRN

## 2014-06-23 MED ORDER — FLUOXETINE HCL 20 MG PO CAPS
20.0000 mg | ORAL_CAPSULE | Freq: Every day | ORAL | Status: DC
  Administered 2014-06-23 – 2014-06-27 (×5): 20 mg via ORAL
  Filled 2014-06-23 (×5): qty 1

## 2014-06-23 MED ORDER — ASPIRIN EC 325 MG PO TBEC
325.0000 mg | DELAYED_RELEASE_TABLET | Freq: Every day | ORAL | Status: DC
  Administered 2014-06-23 – 2014-06-25 (×4): 325 mg via ORAL
  Filled 2014-06-23 (×5): qty 1

## 2014-06-23 MED ORDER — LORATADINE 10 MG PO TABS
10.0000 mg | ORAL_TABLET | Freq: Every day | ORAL | Status: DC
  Administered 2014-06-23 – 2014-06-27 (×5): 10 mg via ORAL
  Filled 2014-06-23 (×5): qty 1

## 2014-06-23 MED ORDER — METHYLPREDNISOLONE SODIUM SUCC 125 MG IJ SOLR: 125.0000 mg | Freq: Once | INTRAMUSCULAR | Status: DC

## 2014-06-23 MED ORDER — ONDANSETRON HCL 4 MG PO TABS: 4.0000 mg | ORAL_TABLET | Freq: Four times a day (QID) | ORAL | Status: DC | PRN

## 2014-06-23 MED ORDER — VANCOMYCIN HCL 10 G IV SOLR
1500.0000 mg | INTRAVENOUS | Status: AC
  Administered 2014-06-23: 1500 mg via INTRAVENOUS
  Filled 2014-06-23: qty 1500

## 2014-06-23 MED ORDER — ACETAMINOPHEN 325 MG PO TABS: 650.0000 mg | ORAL_TABLET | Freq: Four times a day (QID) | ORAL | Status: DC | PRN

## 2014-06-23 MED ORDER — SILVER SULFADIAZINE 1 % EX CREA
TOPICAL_CREAM | Freq: Two times a day (BID) | CUTANEOUS | Status: DC
  Administered 2014-06-23 – 2014-06-24 (×2): 1 via TOPICAL
  Administered 2014-06-24 – 2014-06-27 (×6): via TOPICAL
  Filled 2014-06-23 (×2): qty 85

## 2014-06-23 MED ORDER — BIOTENE DRY MOUTH MT LIQD
15.0000 mL | Freq: Two times a day (BID) | OROMUCOSAL | Status: DC
  Administered 2014-06-23 – 2014-06-27 (×8): 15 mL via OROMUCOSAL

## 2014-06-23 MED ORDER — HYDROCODONE-ACETAMINOPHEN 5-325 MG PO TABS
1.0000 | ORAL_TABLET | ORAL | Status: DC | PRN
  Administered 2014-06-23: 2 via ORAL
  Administered 2014-06-23: 1 via ORAL
  Administered 2014-06-23: 2 via ORAL
  Administered 2014-06-25 – 2014-06-26 (×4): 1 via ORAL
  Filled 2014-06-23 (×3): qty 1
  Filled 2014-06-23: qty 2
  Filled 2014-06-23 (×2): qty 1
  Filled 2014-06-23: qty 2

## 2014-06-23 MED ORDER — HYDROCORTISONE NA SUCCINATE PF 100 MG IJ SOLR
100.0000 mg | Freq: Three times a day (TID) | INTRAMUSCULAR | Status: DC
  Administered 2014-06-23 – 2014-06-24 (×2): 100 mg via INTRAVENOUS
  Filled 2014-06-23 (×6): qty 2

## 2014-06-23 MED ORDER — LATANOPROST 0.005 % OP SOLN
1.0000 [drp] | Freq: Every day | OPHTHALMIC | Status: DC
  Administered 2014-06-23 – 2014-06-26 (×4): 1 [drp] via OPHTHALMIC
  Filled 2014-06-23: qty 2.5

## 2014-06-23 MED ORDER — PIPERACILLIN-TAZOBACTAM 3.375 G IVPB
3.3750 g | Freq: Three times a day (TID) | INTRAVENOUS | Status: DC
  Administered 2014-06-23 – 2014-06-25 (×8): 3.375 g via INTRAVENOUS
  Filled 2014-06-23 (×11): qty 50

## 2014-06-23 MED ORDER — SODIUM CHLORIDE 0.9 % IV SOLN
INTRAVENOUS | Status: AC
  Administered 2014-06-23: 15:00:00 via INTRAVENOUS
  Administered 2014-06-25: 50 mL/h via INTRAVENOUS
  Administered 2014-06-26: 30 mL/h via INTRAVENOUS
  Administered 2014-06-26: 50 mL/h via INTRAVENOUS

## 2014-06-23 MED ORDER — ONDANSETRON HCL 4 MG/2ML IJ SOLN: 4.0000 mg | Freq: Four times a day (QID) | INTRAMUSCULAR | Status: DC | PRN

## 2014-06-23 MED ORDER — FINASTERIDE 5 MG PO TABS
5.0000 mg | ORAL_TABLET | Freq: Every day | ORAL | Status: DC
  Administered 2014-06-23 – 2014-06-26 (×5): 5 mg via ORAL
  Filled 2014-06-23 (×6): qty 1

## 2014-06-23 MED ORDER — SODIUM CHLORIDE 0.9 % IJ SOLN
3.0000 mL | Freq: Two times a day (BID) | INTRAMUSCULAR | Status: DC
  Administered 2014-06-23: 3 mL via INTRAVENOUS
  Administered 2014-06-24: 10 mL via INTRAVENOUS
  Administered 2014-06-24 – 2014-06-26 (×4): 3 mL via INTRAVENOUS

## 2014-06-23 NOTE — Progress Notes (Signed)
[facility] PROGRESS NOTE  Joshua Norton XKG:[id number] DOB: 1919/03/27 DOA: 06/22/2014 PCP: [name], MD  Assessment/Plan: 1. Sepsis: from UTI: - SBP parameters around 80's, started on IV hydrocortisone, on IV fluids. 2 liters already given. On IV ZOSYN, added IV vancomycin. Spoke to Dr Chase Caller , plan to transfer patient to ICU for pressors. Discussed with family. Family member at bedside.  Urine cultures are pending.   2. Hyponatremia; - unlcear etiology.  - tsh, and urine electrolytes.   3. Right shoulder pain; - probably from severe arthritis.  - pain control and MRI of the shoulder.    DVT prophylaxis.   Code Status: full code , confirmed  Family Communication: spoke to grand daughter over the phone. No phone number available for the daughter. Disposition Plan: transfer to ICU to PCCM Service.    Consultants:  Urology.   Procedures:  none  Antibiotics:  Zosyn   HPI/Subjective: Denies any complaints. Spoke to the grand daughter and updated her. Daughter is in [place] and could not be reached.   Objective: Filed Vitals:   06/23/14 1325  BP: 79/41  Pulse: 83  Temp:   Resp:     Intake/Output Summary (Last 24 hours) at 06/23/14 1355 Last data filed at 06/23/14 0900  Gross per 24 hour  Intake 1708.33 ml  Output      0 ml  Net 1708.33 ml   Filed Weights   06/23/14 0143  Weight: 79.4 kg (175 lb 0.7 oz)    Exam:   General:  Alert afebrile confused.  Cardiovascular: s1s2  Respiratory: ctab, no wheezing or rhonchi  Abdomen: soft NT ND BS+  Musculoskeletal: trace pedal edema.  Data Reviewed: Basic Metabolic Panel:  Recent Labs Lab 06/22/14 2008 06/23/14 0623  NA 125* 127*  K 4.0 4.0  CL 86* 88*  CO2 22 18*  GLUCOSE 111* 100*  BUN 59* 58*  CREATININE 1.48* 1.38*  CALCIUM 8.2* 8.5   Liver Function Tests: No results found for this basename: AST, ALT, ALKPHOS, BILITOT, PROT, ALBUMIN,  in the last 168 hours No results  found for this basename: LIPASE, AMYLASE,  in the last 168 hours No results found for this basename: AMMONIA,  in the last 168 hours CBC:  Recent Labs Lab 06/22/14 2008 06/23/14 0623  WBC 13.3* 13.2*  NEUTROABS 10.6*  --   HGB 11.5* 11.7*  HCT 32.0* 33.4*  MCV 80.0 81.3  PLT 377 333   Cardiac Enzymes:  Recent Labs Lab 06/22/14 2008  TROPONINI <0.30   BNP (last 3 results) No results found for this basename: PROBNP,  in the last 8760 hours CBG: No results found for this basename: GLUCAP,  in the last 168 hours  No results found for this or any previous visit (from the past 240 hour(s)).   Studies: Dg Chest 2 View  06/22/2014   CLINICAL DATA:  Shoulder pain.  Cardiomyopathy.  EXAM: CHEST  2 VIEW  COMPARISON:  05/16/2014  FINDINGS: Stable cardiomegaly and mediastinal contours. Lateral view limited by the patient's arm thickening rays. Pulmonary vascularity is normal. The lungs are normally expanded and clear. No airspace disease or pleural effusion. Negative for pneumothorax. 3 mm radiopaque foreign body projects over the soft tissues of the left neck on the frontal view. No acute osseous abnormality identified. Please note that the majority of the right shoulder is excluded from this image.  IMPRESSION: Stable mild cardiomegaly.  No acute findings in the chest.   Electronically Signed   By: [name]  Turner M.D.   On: 06/22/2014 22:01   Dg Shoulder Right  06/22/2014   CLINICAL DATA:  Right shoulder pain and limited range of motion after a fall.  EXAM: RIGHT SHOULDER - 2+ VIEW  COMPARISON:  03/23/2014  FINDINGS: Degenerative changes in the glenohumeral and acromioclavicular joints. Decreased subacromial space consistent with chronic rotator cuff arthropathy. No evidence of acute fracture or dislocation of the right shoulder.  IMPRESSION: Degenerative changes in the right shoulder with probable chronic rotator cuff arthropathy. No acute bony abnormalities identified 3   Electronically  Signed   By: [name] M.D.   On: 06/22/2014 22:02   Ct Head Wo Contrast  06/22/2014   CLINICAL DATA:  Neck pain after a fall.  EXAM: CT HEAD WITHOUT CONTRAST  CT CERVICAL SPINE WITHOUT CONTRAST  TECHNIQUE: Multidetector CT imaging of the head and cervical spine was performed following the standard protocol without intravenous contrast. Multiplanar CT image reconstructions of the cervical spine were also generated.  COMPARISON:  None.  FINDINGS: CT HEAD FINDINGS  Prominent diffuse cerebral atrophy. Ventricular dilatation is likely due to central atrophy. Low-attenuation changes in the deep white matter consistent with small vessel ischemia. No mass effect or midline shift. No abnormal extra-axial fluid collections. Gray-white matter junctions are distinct. Basal cisterns are not effaced. No evidence of acute intracranial hemorrhage. No depressed skull fractures. Visualized paranasal sinuses and mastoid air cells are not opacified.  CT CERVICAL SPINE FINDINGS  Straightening of the usual cervical lordosis which may be due to patient positioning but ligamentous injury or muscle spasm can also have this appearance. There is diffuse degenerative change throughout the cervical spine with diffuse narrowing of cervical interspaces and associated endplate hypertrophic changes. Prominent disc osteophyte complexes at C3-4, C4-5, C5-6, and C6-7 levels. C1-2 articulation appears intact. There is diffuse heterogeneous sclerosis and lucency demonstrated throughout the cervical spine and in the visualized upper thoracic vertebra. This is likely to indicate diffuse bone metastasis. Renal osteodystrophy, metabolic abnormalities, or severe osteoporosis could also potentially have this appearance. Correlation with PSA is recommended.  IMPRESSION: No acute intracranial abnormalities. Chronic atrophy and small vessel ischemic changes.  Nonspecific straightening of the usual cervical lordosis. Diffuse degenerative change  throughout the cervical spine. Heterogeneous lytic and sclerotic changes throughout the visualized spine worrisome for bone metastasis.   Electronically Signed   By: [name] M.D.   On: 06/22/2014 22:19   Ct Cervical Spine Wo Contrast  06/22/2014   CLINICAL DATA:  Neck pain after a fall.  EXAM: CT HEAD WITHOUT CONTRAST  CT CERVICAL SPINE WITHOUT CONTRAST  TECHNIQUE: Multidetector CT imaging of the head and cervical spine was performed following the standard protocol without intravenous contrast. Multiplanar CT image reconstructions of the cervical spine were also generated.  COMPARISON:  None.  FINDINGS: CT HEAD FINDINGS  Prominent diffuse cerebral atrophy. Ventricular dilatation is likely due to central atrophy. Low-attenuation changes in the deep white matter consistent with small vessel ischemia. No mass effect or midline shift. No abnormal extra-axial fluid collections. Gray-white matter junctions are distinct. Basal cisterns are not effaced. No evidence of acute intracranial hemorrhage. No depressed skull fractures. Visualized paranasal sinuses and mastoid air cells are not opacified.  CT CERVICAL SPINE FINDINGS  Straightening of the usual cervical lordosis which may be due to patient positioning but ligamentous injury or muscle spasm can also have this appearance. There is diffuse degenerative change throughout the cervical spine with diffuse narrowing of cervical interspaces and associated endplate hypertrophic changes. Prominent  disc osteophyte complexes at C3-4, C4-5, C5-6, and C6-7 levels. C1-2 articulation appears intact. There is diffuse heterogeneous sclerosis and lucency demonstrated throughout the cervical spine and in the visualized upper thoracic vertebra. This is likely to indicate diffuse bone metastasis. Renal osteodystrophy, metabolic abnormalities, or severe osteoporosis could also potentially have this appearance. Correlation with PSA is recommended.  IMPRESSION: No acute  intracranial abnormalities. Chronic atrophy and small vessel ischemic changes.  Nonspecific straightening of the usual cervical lordosis. Diffuse degenerative change throughout the cervical spine. Heterogeneous lytic and sclerotic changes throughout the visualized spine worrisome for bone metastasis.   Electronically Signed   By: [name] M.D.   On: 06/22/2014 22:19   Dg Humerus Right  06/22/2014   CLINICAL DATA:  Right shoulder pain and limited range of motion after a fall.  EXAM: RIGHT HUMERUS - 2+ VIEW  COMPARISON:  Right shoulder 03/23/2014  FINDINGS: Degenerative changes in the right shoulder. No evidence of acute fracture or dislocation of the humerus. Soft tissues are unremarkable.  IMPRESSION: No acute bony abnormalities.   Electronically Signed   By: [name] M.D.   On: 06/22/2014 22:11    Scheduled Meds: . sodium chloride   Intravenous Once  . antiseptic oral rinse  15 mL Mouth Rinse BID  . aspirin EC  325 mg Oral QHS  . brinzolamide  1 drop Both Eyes BID  . enoxaparin (LOVENOX) injection  30 mg Subcutaneous Daily  . finasteride  5 mg Oral QHS  . FLUoxetine  20 mg Oral Daily  . hydrocortisone sod succinate (SOLU-CORTEF) inj  100 mg Intravenous Q8H  . latanoprost  1 drop Both Eyes QHS  . loratadine  10 mg Oral Daily  . pantoprazole  20 mg Oral QHS  . piperacillin-tazobactam (ZOSYN)  IV  3.375 g Intravenous Q8H  . silver sulfADIAZINE   Topical BID  . sodium chloride  3 mL Intravenous Q12H   Continuous Infusions: . sodium chloride      Principal Problem:   SIRS (systemic inflammatory response syndrome) Active Problems:   UTI (lower urinary tract infection)    Time spent: 35 minutes.     [facility] Pager [phone]. If 7PM-7AM, please contact night-coverage at [email or web address], password [facility] 06/23/2014, 1:55 PM  LOS: 1 day

## 2014-06-23 NOTE — Progress Notes (Signed)
Attempted to call and give report to [facility]; CN will call back.

## 2014-06-23 NOTE — Progress Notes (Addendum)
Pt transferred to [place] via bed.

## 2014-06-23 NOTE — Consult Note (Signed)
PULMONARY / CRITICAL CARE MEDICINE   Name: Joshua Norton MRN: [id number] DOB: 02-24-1919    ADMISSION DATE:  06/22/2014 CONSULTATION DATE:  7/12  REFERRING MD :  Karleen Hampshire PRIMARY SERVICE:   [facility]  CHIEF COMPLAINT:  Septic shock   BRIEF PATIENT DESCRIPTION:  This is a 80 yo male admitted on 7/11 w/ h/o short term memory def and falls at home. Presented w/ CC right shoulder pain, decreased MS and hypotension identified by home health nurse.   While in ER found to be hypotensive w/ UTI. Admitted initially to medical ward. Remained hypotensive into 7/12 in spite of IVFs and abx. PCCM asked to eval.   SIGNIFICANT EVENTS / STUDIES:  7/11 ct head: chronic atrophy/sm vessel disease, no acute changes 7/11 CT C spine: Nonspecific straightening of the usual cervical lordosis. Diffuse degenerative change throughout the cervical spine. Heterogeneous lytic and sclerotic changes throughout the visualized spine worrisome for bone metastasis. (read by Maura Crandall)  LINES / TUBES:   CULTURES: UC 7/11>>> PCT 7/12 Lactate 7/12   ANTIBIOTICS: Zosyn 7/11>>> vanc 7/12>>>  HISTORY OF PRESENT ILLNESS:   80 y.o. male, with past medical history significant for hypertension and cardiomyopathy and who lives at home presented to the emergency on 7/11 for evaluation of right shoulder pain which is chronic. In the emergency room he was noted to have low blood pressure and a urinary tract infection.  He denied chest pains or shortness of breath nausea vomiting or diarrhea. Patient has a chronic Foley and it was removed in the emergency room after he was noted to have an area of necrosis on the glans of his penis and urology was consulted. Patient was also noted to be hyponatremic with worsening of his renal function. Was admitted to the IM service w/ working dx of UTI and sepsis. PCCM asked to see on 7/12 for persistent hypotension in spite of 2 liters NS and empiric abx  PAST MEDICAL HISTORY :  Past Medical  History  Diagnosis Date  . Edema of both legs 06/09/2012    2D Echo - EF 40-45, left ventricle cavity moderately dilated  . HTN (hypertension), benign     On multiple medications  . Osteoarthritis (arthritis due to wear and tear of joints)     S/p L Knee Arthroplasty  . Diastolic dysfunction     Chronic  . Cardiomyopathy     EF 40-45% 2D 6/13  . Prediabetes   . Hyperlipidemia   . Anemia   . Vitamin D deficiency   . Glaucoma    Past Surgical History  Procedure Laterality Date  . Left knee arthroplasty  09/2003  . Transurethral resection of prostate    . Cataract extraction    . Cervical laminectomy     Prior to Admission medications   Medication Sig Start Date End Date Taking? Authorizing Provider  aspirin EC 325 MG tablet Take 325 mg by mouth at bedtime.   Yes Historical Provider, MD  bimatoprost (LUMIGAN) 0.01 % SOLN Place 1 drop into both eyes at bedtime.   Yes Historical Provider, MD  brinzolamide (AZOPT) 1 % ophthalmic suspension Place 1 drop into both eyes 2 (two) times daily.   Yes Historical Provider, MD  Cholecalciferol (VITAMIN D) 2000 UNITS tablet Take 2,000 Units by mouth at bedtime.   Yes Historical Provider, MD  fexofenadine (ALLEGRA) 180 MG tablet Take 180 mg by mouth at bedtime.    Yes Historical Provider, MD  finasteride (PROSCAR) 5 MG tablet Take 5 mg  by mouth at bedtime.   Yes Historical Provider, MD  FLUoxetine (PROZAC) 20 MG capsule Take 20 mg by mouth daily.   Yes Historical Provider, MD  furosemide (LASIX) 40 MG tablet Take 20 mg by mouth daily.   Yes Historical Provider, MD  GLUCOSAMINE-CHONDROITIN-MSM PO Take 1 tablet by mouth at bedtime. Glucosamine 1500 mg/ chrondroiton 1200 mg   Yes Historical Provider, MD  Multiple Vitamin (MULTIVITAMIN WITH MINERALS) TABS tablet Take 1 tablet by mouth at bedtime.   Yes Historical Provider, MD  pantoprazole (PROTONIX) 20 MG tablet Take 20 mg by mouth at bedtime.    Yes Historical Provider, MD  quinapril (ACCUPRIL) 20  MG tablet Take 20 mg by mouth at bedtime.   Yes Historical Provider, MD  vitamin B-12 (CYANOCOBALAMIN) 1000 MCG tablet Take 1,000 mcg by mouth at bedtime.   Yes Historical Provider, MD   Allergies  Allergen Reactions  . Doxazosin Swelling    FAMILY HISTORY:  Family History  Problem Relation Age of Onset  . Breast cancer Mother   . Stroke Brother    SOCIAL HISTORY:  reports that he has never smoked. He has never used smokeless tobacco. He reports that he does not drink alcohol or use illicit drugs.  REVIEW OF SYSTEMS:   Unable  SUBJECTIVE:  Not in acute distress  VITAL SIGNS: Temp:  [97.4 F (36.3 C)-98.7 F (37.1 C)] 98.5 F (36.9 C) (07/12 0900) Pulse Rate:  [58-85] 83 (07/12 1325) Resp:  [11-18] 18 (07/12 0900) BP: (63-137)/(33-80) 79/41 mmHg (07/12 1325) SpO2:  [95 %-100 %] 98 % (07/12 1325) Weight:  [79.4 kg (175 lb 0.7 oz)] 79.4 kg (175 lb 0.7 oz) (07/12 0143) HEMODYNAMICS:   VENTILATOR SETTINGS:   INTAKE / OUTPUT: Intake/Output     07/11 0701 - 07/12 0700 07/12 0701 - 07/13 0700   P.O.  240   I.V. (mL/kg) 1468.3 (18.5)    Total Intake(mL/kg) 1468.3 (18.5) 240 (3)   Net +1468.3 +240        Urine Occurrence 2 x 2 x     PHYSICAL EXAMINATION: General:  Chronically ill appearing AAM, no acute distress.  Neuro:  Sp slurred, oriented X1, moves all ext but weak HEENT:  MM dry, neck veins flat poor dentition  Cardiovascular:  rrr Lungs:  CTA  Abdomen:  Soft, non-tender. + bowel sounds  Musculoskeletal:  Generalized weakness. C/o right shoulder pain.  Skin:  Penis excoriated and crusted inc of urine.   LABS:  PULMONARY No results found for this basename: PHART, PCO2, PCO2ART, PO2, PO2ART, HCO3, TCO2, O2SAT,  in the last 168 hours  CBC  Recent Labs Lab 06/22/14 2008 06/23/14 0623  HGB 11.5* 11.7*  HCT 32.0* 33.4*  WBC 13.3* 13.2*  PLT 377 333    COAGULATION No results found for this basename: INR,  in the last 168 hours  CARDIAC   Recent  Labs Lab 06/22/14 2008  TROPONINI <0.30   No results found for this basename: PROBNP,  in the last 168 hours   CHEMISTRY  Recent Labs Lab 06/22/14 2008 06/23/14 0623  NA 125* 127*  K 4.0 4.0  CL 86* 88*  CO2 22 18*  GLUCOSE 111* 100*  BUN 59* 58*  CREATININE 1.48* 1.38*  CALCIUM 8.2* 8.5   Estimated Creatinine Clearance: 33.6 ml/min (by C-G formula based on Cr of 1.38).   LIVER No results found for this basename: AST, ALT, ALKPHOS, BILITOT, PROT, ALBUMIN, INR,  in the last 168 hours  INFECTIOUS No results found for this basename: LATICACIDVEN, PROCALCITON,  in the last 168 hours   ENDOCRINE CBG (last 3)  No results found for this basename: GLUCAP,  in the last 72 hours       IMAGING x48h  Dg Chest 2 View  06/22/2014   CLINICAL DATA:  Shoulder pain.  Cardiomyopathy.  EXAM: CHEST  2 VIEW  COMPARISON:  05/16/2014  FINDINGS: Stable cardiomegaly and mediastinal contours. Lateral view limited by the patient's arm thickening rays. Pulmonary vascularity is normal. The lungs are normally expanded and clear. No airspace disease or pleural effusion. Negative for pneumothorax. 3 mm radiopaque foreign body projects over the soft tissues of the left neck on the frontal view. No acute osseous abnormality identified. Please note that the majority of the right shoulder is excluded from this image.  IMPRESSION: Stable mild cardiomegaly.  No acute findings in the chest.   Electronically Signed   By: Curlene Dolphin M.D.   On: 06/22/2014 22:01   Dg Shoulder Right  06/22/2014   CLINICAL DATA:  Right shoulder pain and limited range of motion after a fall.  EXAM: RIGHT SHOULDER - 2+ VIEW  COMPARISON:  03/23/2014  FINDINGS: Degenerative changes in the glenohumeral and acromioclavicular joints. Decreased subacromial space consistent with chronic rotator cuff arthropathy. No evidence of acute fracture or dislocation of the right shoulder.  IMPRESSION: Degenerative changes in the right shoulder  with probable chronic rotator cuff arthropathy. No acute bony abnormalities identified 3   Electronically Signed   By: [name] M.D.   On: 06/22/2014 22:02   Ct Head Wo Contrast  06/22/2014   CLINICAL DATA:  Neck pain after a fall.  EXAM: CT HEAD WITHOUT CONTRAST  CT CERVICAL SPINE WITHOUT CONTRAST  TECHNIQUE: Multidetector CT imaging of the head and cervical spine was performed following the standard protocol without intravenous contrast. Multiplanar CT image reconstructions of the cervical spine were also generated.  COMPARISON:  None.  FINDINGS: CT HEAD FINDINGS  Prominent diffuse cerebral atrophy. Ventricular dilatation is likely due to central atrophy. Low-attenuation changes in the deep white matter consistent with small vessel ischemia. No mass effect or midline shift. No abnormal extra-axial fluid collections. Gray-white matter junctions are distinct. Basal cisterns are not effaced. No evidence of acute intracranial hemorrhage. No depressed skull fractures. Visualized paranasal sinuses and mastoid air cells are not opacified.  CT CERVICAL SPINE FINDINGS  Straightening of the usual cervical lordosis which may be due to patient positioning but ligamentous injury or muscle spasm can also have this appearance. There is diffuse degenerative change throughout the cervical spine with diffuse narrowing of cervical interspaces and associated endplate hypertrophic changes. Prominent disc osteophyte complexes at C3-4, C4-5, C5-6, and C6-7 levels. C1-2 articulation appears intact. There is diffuse heterogeneous sclerosis and lucency demonstrated throughout the cervical spine and in the visualized upper thoracic vertebra. This is likely to indicate diffuse bone metastasis. Renal osteodystrophy, metabolic abnormalities, or severe osteoporosis could also potentially have this appearance. Correlation with PSA is recommended.  IMPRESSION: No acute intracranial abnormalities. Chronic atrophy and small vessel  ischemic changes.  Nonspecific straightening of the usual cervical lordosis. Diffuse degenerative change throughout the cervical spine. Heterogeneous lytic and sclerotic changes throughout the visualized spine worrisome for bone metastasis.   Electronically Signed   By: [name] M.D.   On: 06/22/2014 22:19   Ct Cervical Spine Wo Contrast  06/22/2014   CLINICAL DATA:  Neck pain after a fall.  EXAM: CT HEAD WITHOUT CONTRAST  CT CERVICAL SPINE WITHOUT CONTRAST  TECHNIQUE: Multidetector CT imaging of the head and cervical spine was performed following the standard protocol without intravenous contrast. Multiplanar CT image reconstructions of the cervical spine were also generated.  COMPARISON:  None.  FINDINGS: CT HEAD FINDINGS  Prominent diffuse cerebral atrophy. Ventricular dilatation is likely due to central atrophy. Low-attenuation changes in the deep white matter consistent with small vessel ischemia. No mass effect or midline shift. No abnormal extra-axial fluid collections. Gray-white matter junctions are distinct. Basal cisterns are not effaced. No evidence of acute intracranial hemorrhage. No depressed skull fractures. Visualized paranasal sinuses and mastoid air cells are not opacified.  CT CERVICAL SPINE FINDINGS  Straightening of the usual cervical lordosis which may be due to patient positioning but ligamentous injury or muscle spasm can also have this appearance. There is diffuse degenerative change throughout the cervical spine with diffuse narrowing of cervical interspaces and associated endplate hypertrophic changes. Prominent disc osteophyte complexes at C3-4, C4-5, C5-6, and C6-7 levels. C1-2 articulation appears intact. There is diffuse heterogeneous sclerosis and lucency demonstrated throughout the cervical spine and in the visualized upper thoracic vertebra. This is likely to indicate diffuse bone metastasis. Renal osteodystrophy, metabolic abnormalities, or severe osteoporosis could  also potentially have this appearance. Correlation with PSA is recommended.  IMPRESSION: No acute intracranial abnormalities. Chronic atrophy and small vessel ischemic changes.  Nonspecific straightening of the usual cervical lordosis. Diffuse degenerative change throughout the cervical spine. Heterogeneous lytic and sclerotic changes throughout the visualized spine worrisome for bone metastasis.   Electronically Signed   By: Burman Nieves M.D.   On: 06/22/2014 22:19   Dg Humerus Right  06/22/2014   CLINICAL DATA:  Right shoulder pain and limited range of motion after a fall.  EXAM: RIGHT HUMERUS - 2+ VIEW  COMPARISON:  Right shoulder 03/23/2014  FINDINGS: Degenerative changes in the right shoulder. No evidence of acute fracture or dislocation of the humerus. Soft tissues are unremarkable.  IMPRESSION: No acute bony abnormalities.   Electronically Signed   By: Burman Nieves M.D.   On: 06/22/2014 22:11      ASSESSMENT / PLAN:  PULMONARY A: no acute   P:   Pulse ox  O2 as needed   CARDIOVASCULAR A:  Septic shock. Likely UT source  H/o systolic and diastolic CM. Baseline EF 40-45% but improved to 60% in June 2015 P:  Move to ICU Fluid challenge Ck cortisol Stress dose steroids Use neo if needed via PIV Consider central access if Still hypotensive may need CVL   RENAL A:   Acute renal failure Metabolic acidosis-->worse + AG  Hyponatremia   P:   Move to ICU Ck lactic acid Push volume  Renal dose meds Consider central access  GASTROINTESTINAL A:   No acute  P:   dat   HEMATOLOGIC A:   Anemia of chronic diseae No evidence of bleeding  P:  Trend CBC  Cont LMWH' PRBC for hgb < 7gm%  INFECTIOUS A:   Severe sepsis UT source (presumed) P:   Widen abx See CV section  F./u cultures   ENDOCRINE A:   No acute  P:   Ck cortisol   NEUROLOGIC A:   Short term memory def  P:   RASS goal: 0 Supportive care   Musculoskeletal  A: Right shoulder pain/  plain films neg CT c spine suggest of metastasis P: May need MRI to further eval  Check PSA Check Alk phos  TODAY'S SUMMARY:  Transfer  to ICU. Cont IVF bolus. Ck lactic acid, cortisol and widen abx. May need central access. Use neo via PIV first. STaff MD offered to update grandaughter at bedside: but she had no questions. Updated her anyway  Marni [facility]on [facility] [place] Pager: [phone] 06/23/2014, 2:19 PM   STAFF NOTE: I, Dr Ann Lions have personally reviewed patient's available data, including medical history, events of note, physical examination and test results as part of my evaluation. I have discussed with resident/NP and other care providers such as pharmacist, RN and RRT.  In addition,  I personally evaluated patient and elicited key findings of - septic shock due to UTI. DId not respond to 2L IVF but question if she got it in floor. BP normalizing shortly after ICU admission with fluids. Use neo as PIV if needed. Needs malignancy workup.  Rest per NP/medical resident whose note is outlined above and that I agree with  The patient is critically ill with multiple organ systems failure and requires high complexity decision making for assessment and support, frequent evaluation and titration of therapies, application of advanced monitoring technologies and extensive interpretation of multiple databases.   Critical Care Time devoted to patient care services described in this note is  30  Minutes.  Dr. Brand Males, M.D., [facility].C.P Pulmonary and Critical Care Medicine Staff Physician [facility] Pager: [phone], If no answer or between  15:00h - 7:00h: call [phone]  06/23/2014 4:24 PM

## 2014-06-23 NOTE — Progress Notes (Signed)
ANTIBIOTIC CONSULT NOTE - INITIAL  Pharmacy Consult for vancomycin Indication: sepsis, urinary source suspected  Allergies  Allergen Reactions  . Doxazosin Swelling    Patient Measurements: Height: 5' 8.5" (174 cm) Weight: 175 lb 0.7 oz (79.4 kg) IBW/kg (Calculated) : 69.56  Vital Signs: Temp: 98.5 F (36.9 C) (07/12 0900) Temp src: Oral (07/12 0900) BP: 79/41 mmHg (07/12 1325) Pulse Rate: 83 (07/12 1325) Intake/Output from previous day: 07/11 0701 - 07/12 0700 In: 1468.3 [I.V.:1468.3] Out: -  Intake/Output from this shift: Total I/O In: 240 [P.O.:240] Out: -   Labs:  Recent Labs  06/22/14 2008 06/23/14 0623  WBC 13.3* 13.2*  HGB 11.5* 11.7*  PLT 377 333  CREATININE 1.48* 1.38*   Estimated Creatinine Clearance: 33.6 ml/min (by C-G formula based on Cr of 1.38). No results found for this basename: VANCOTROUGH, VANCOPEAK, VANCORANDOM, GENTTROUGH, GENTPEAK, GENTRANDOM, TOBRATROUGH, TOBRAPEAK, TOBRARND, AMIKACINPEAK, AMIKACINTROU, AMIKACIN,  in the last 72 hours   Microbiology: No results found for this or any previous visit (from the past 720 hour(s)).  Medical History: Past Medical History  Diagnosis Date  . Edema of both legs 06/09/2012    2D Echo - EF 40-45, left ventricle cavity moderately dilated  . HTN (hypertension), benign     On multiple medications  . Osteoarthritis (arthritis due to wear and tear of joints)     S/p L Knee Arthroplasty  . Diastolic dysfunction     Chronic  . Cardiomyopathy     EF 40-45% 2D 6/13  . Prediabetes   . Hyperlipidemia   . Anemia   . Vitamin D deficiency   . Glaucoma     Medications:  Prescriptions prior to admission  Medication Sig Dispense Refill  . aspirin EC 325 MG tablet Take 325 mg by mouth at bedtime.      . bimatoprost (LUMIGAN) 0.01 % SOLN Place 1 drop into both eyes at bedtime.      . brinzolamide (AZOPT) 1 % ophthalmic suspension Place 1 drop into both eyes 2 (two) times daily.      . Cholecalciferol  (VITAMIN D) 2000 UNITS tablet Take 2,000 Units by mouth at bedtime.      . fexofenadine (ALLEGRA) 180 MG tablet Take 180 mg by mouth at bedtime.       . finasteride (PROSCAR) 5 MG tablet Take 5 mg by mouth at bedtime.      Marland Kitchen FLUoxetine (PROZAC) 20 MG capsule Take 20 mg by mouth daily.      . furosemide (LASIX) 40 MG tablet Take 20 mg by mouth daily.      Marland Kitchen GLUCOSAMINE-CHONDROITIN-MSM PO Take 1 tablet by mouth at bedtime. Glucosamine 1500 mg/ chrondroiton 1200 mg      . Multiple Vitamin (MULTIVITAMIN WITH MINERALS) TABS tablet Take 1 tablet by mouth at bedtime.      . pantoprazole (PROTONIX) 20 MG tablet Take 20 mg by mouth at bedtime.       . quinapril (ACCUPRIL) 20 MG tablet Take 20 mg by mouth at bedtime.      . vitamin B-12 (CYANOCOBALAMIN) 1000 MCG tablet Take 1,000 mcg by mouth at bedtime.       Assessment: 80 y/o male who presented to the ED on 7/11 with chronic R shoulder pain. He was noted to have necrosis at the tip of the penis secondary to an indwelling foley and hypotension. He was placed on ceftriaxone for a UTI which has now been discontinued. Pharmacy consulted to begin vancomycin for sepsis. He  is also prescribed Zosyn. Plan is to transfer the patient to the ICU. SCr is elevated at 1.38 which is above his baseline. He is hypotensive, WBC are elevated, but he is afebrile. Urine culture is pending.  Goal of Therapy:  Vancomycin trough level 15-20 mcg/ml Eradication of infection  Plan:  - Vancomycin 1500 mg IV x1 dose then 1000 mg IV q24h - Monitor renal function, clinical progress, and culture data  [facility], [place].D., BCPS Clinical Pharmacist Pager: [phone] 06/23/2014 2:23 PM

## 2014-06-23 NOTE — Consult Note (Signed)
Urology Consult  Referring physician: Dr Ezequiel Essex Reason for referral: Necrosis glans penis  History of Present Illness: Patient is a 80 years old male who presented to the emergency room with right shoulder pain. He has an indwelling Foley catheter. He was found on examination in the emergency room to have some necrosis at the meatus. He has a history of hematuria and was evaluated in October 2014 by Dr. Karsten Ro. Renal ultrasound showed bilateral simple lower poles renal cysts and a mildly complex cyst in the upper pole of the right kidney. Cystoscopy showed previous TURP, no evidence of bladder tumor or stone. He has a history of hematuria on and off. He was seen in the emergency room a week ago for hematuria.  Past Medical History  Diagnosis Date  . Edema of both legs 06/09/2012    2D Echo - EF 40-45, left ventricle cavity moderately dilated  . HTN (hypertension), benign     On multiple medications  . Osteoarthritis (arthritis due to wear and tear of joints)     S/p L Knee Arthroplasty  . Diastolic dysfunction     Chronic  . Cardiomyopathy     EF 40-45% 2D 6/13  . Prediabetes   . Hyperlipidemia   . Anemia   . Vitamin D deficiency   . Glaucoma    Past Surgical History  Procedure Laterality Date  . Left knee arthroplasty  09/2003  . Transurethral resection of prostate    . Cataract extraction    . Cervical laminectomy      Medications: Aspirin, vitamin D, Allegra, finasteride, fluoxetine, pantoprazole, quinapril, vitamin B12, multivitamins, furosemide Allergies:  Allergies  Allergen Reactions  . Doxazosin Swelling    Family History  Problem Relation Age of Onset  . Breast cancer Mother   . Stroke Brother    Social History:  reports that he has never smoked. He has never used smokeless tobacco. He reports that he does not drink alcohol or use illicit drugs.  ROS: All systems are reviewed and negative except as noted.   Physical Exam:  Vital signs in last 24  hours: Temp:  [97.4 F (36.3 C)-98.7 F (37.1 C)] 98.5 F (36.9 C) (07/12 0900) Pulse Rate:  [58-85] 83 (07/12 1325) Resp:  [11-18] 18 (07/12 0900) BP: (63-137)/(33-80) 79/41 mmHg (07/12 1325) SpO2:  [95 %-100 %] 98 % (07/12 1325) Weight:  [79.4 kg (175 lb 0.7 oz)] 79.4 kg (175 lb 0.7 oz) (07/12 0143)  Cardiovascular: Skin warm; not flushed Respiratory: Breaths quiet; no shortness of breath Abdomen: No masses Neurological: Normal sensation to touch Musculoskeletal: Normal motor function arms and legs Lymphatics: No inguinal adenopathy Skin: No rashes Genitourinary: Necrotic tissues at the meatus. Foley catheter was removed last night.  Scrotum is normal in appearance.  Testicles are normal. Rectal: Deferred   Laboratory Data:  Results for orders placed during the hospital encounter of 06/22/14 (from the past 72 hour(s))  CBC WITH DIFFERENTIAL     Status: Abnormal   Collection Time    06/22/14  8:08 PM      Result Value Ref Range   WBC 13.3 (*) 4.0 - 10.5 K/uL   RBC 4.00 (*) 4.22 - 5.81 MIL/uL   Hemoglobin 11.5 (*) 13.0 - 17.0 g/dL   HCT 32.0 (*) 39.0 - 52.0 %   MCV 80.0  78.0 - 100.0 fL   MCH 28.8  26.0 - 34.0 pg   MCHC 35.9  30.0 - 36.0 g/dL   RDW 14.6  11.5 -  15.5 %   Platelets 377  150 - 400 K/uL   Neutrophils Relative % 78 (*) 43 - 77 %   Neutro Abs 10.6 (*) 1.7 - 7.7 K/uL   Lymphocytes Relative 10 (*) 12 - 46 %   Lymphs Abs 1.3  0.7 - 4.0 K/uL   Monocytes Relative 11  3 - 12 %   Monocytes Absolute 1.4 (*) 0.1 - 1.0 K/uL   Eosinophils Relative 1  0 - 5 %   Eosinophils Absolute 0.1  0.0 - 0.7 K/uL   Basophils Relative 0  0 - 1 %   Basophils Absolute 0.0  0.0 - 0.1 K/uL  BASIC METABOLIC PANEL     Status: Abnormal   Collection Time    06/22/14  8:08 PM      Result Value Ref Range   Sodium 125 (*) 137 - 147 mEq/L   Potassium 4.0  3.7 - 5.3 mEq/L   Chloride 86 (*) 96 - 112 mEq/L   CO2 22  19 - 32 mEq/L   Glucose, Bld 111 (*) 70 - 99 mg/dL   BUN 59 (*) 6 - 23  mg/dL   Creatinine, Ser 1.49 (*) 0.50 - 1.35 mg/dL   Calcium 8.2 (*) 8.4 - 10.5 mg/dL   GFR calc non Af Amer 39 (*) >90 mL/min   GFR calc Af Amer 46 (*) >90 mL/min   Comment: (NOTE)     The eGFR has been calculated using the CKD EPI equation.     This calculation has not been validated in all clinical situations.     eGFR's persistently <90 mL/min signify possible Chronic Kidney     Disease.   Anion gap 17 (*) 5 - 15  TROPONIN I     Status: None   Collection Time    06/22/14  8:08 PM      Result Value Ref Range   Troponin I <0.30  <0.30 ng/mL   Comment:            Due to the release kinetics of cTnI,     a negative result within the first hours     of the onset of symptoms does not rule out     myocardial infarction with certainty.     If myocardial infarction is still suspected,     repeat the test at appropriate intervals.  URINALYSIS, ROUTINE W REFLEX MICROSCOPIC     Status: Abnormal   Collection Time    06/22/14 10:52 PM      Result Value Ref Range   Color, Urine AMBER (*) YELLOW   Comment: BIOCHEMICALS MAY BE AFFECTED BY COLOR   APPearance TURBID (*) CLEAR   Specific Gravity, Urine 1.015  1.005 - 1.030   pH 6.0  5.0 - 8.0   Glucose, UA NEGATIVE  NEGATIVE mg/dL   Hgb urine dipstick LARGE (*) NEGATIVE   Bilirubin Urine SMALL (*) NEGATIVE   Ketones, ur NEGATIVE  NEGATIVE mg/dL   Protein, ur 30 (*) NEGATIVE mg/dL   Urobilinogen, UA 2.0 (*) 0.0 - 1.0 mg/dL   Nitrite NEGATIVE  NEGATIVE   Leukocytes, UA LARGE (*) NEGATIVE  URINE MICROSCOPIC-ADD ON     Status: Abnormal   Collection Time    06/22/14 10:52 PM      Result Value Ref Range   Squamous Epithelial / LPF RARE  RARE   WBC, UA TOO NUMEROUS TO COUNT  <3 WBC/hpf   RBC / HPF 21-50  <3 RBC/hpf   Bacteria,  UA MANY (*) RARE   Urine-Other MICROSCOPIC EXAM PERFORMED ON UNCONCENTRATED URINE    BASIC METABOLIC PANEL     Status: Abnormal   Collection Time    06/23/14  6:23 AM      Result Value Ref Range   Sodium 127 (*)  137 - 147 mEq/L   Potassium 4.0  3.7 - 5.3 mEq/L   Chloride 88 (*) 96 - 112 mEq/L   CO2 18 (*) 19 - 32 mEq/L   Glucose, Bld 100 (*) 70 - 99 mg/dL   BUN 58 (*) 6 - 23 mg/dL   Creatinine, Ser 1.38 (*) 0.50 - 1.35 mg/dL   Calcium 8.5  8.4 - 10.5 mg/dL   GFR calc non Af Amer 43 (*) >90 mL/min   GFR calc Af Amer 50 (*) >90 mL/min   Comment: (NOTE)     The eGFR has been calculated using the CKD EPI equation.     This calculation has not been validated in all clinical situations.     eGFR's persistently <90 mL/min signify possible Chronic Kidney     Disease.   Anion gap 21 (*) 5 - 15  CBC     Status: Abnormal   Collection Time    06/23/14  6:23 AM      Result Value Ref Range   WBC 13.2 (*) 4.0 - 10.5 K/uL   RBC 4.11 (*) 4.22 - 5.81 MIL/uL   Hemoglobin 11.7 (*) 13.0 - 17.0 g/dL   HCT 33.4 (*) 39.0 - 52.0 %   MCV 81.3  78.0 - 100.0 fL   MCH 28.5  26.0 - 34.0 pg   MCHC 35.0  30.0 - 36.0 g/dL   RDW 14.8  11.5 - 15.5 %   Platelets 333  150 - 400 K/uL   No results found for this or any previous visit (from the past 240 hour(s)). 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 Remove Foley catheter. Leave Foley catheter out. Check postvoid residual. However catheter can be reinserted if residual is high.  The other option is to place a S/P tube.  Dr. Karsten Ro will follow the patient.   Arvil Persons 06/23/2014, 1:28 PM   CC: Dr. Annie Main Rancour

## 2014-06-23 NOTE — Progress Notes (Signed)
Patient arrived on unit via stretcher from ED. No family present.  Telemetry placed per MD order, skin assessed: penis necrosis on tip, heels dry & cracked.  Unable to assess under hard, dry cap of skin on left heel.  Heels elevated on pillow.

## 2014-06-23 NOTE — Consult Note (Signed)
WOC wound consult note Reason for Consult:Left Heel PrU (sDTI), POA Wound type:Pressure Pressure Ulcer POA: Yes Measurement:1cm x 2cm purple area in center of recently reepithelialized larger area of risk measuring 6cm x 7cm. Thick dry skin layer is removed with iris scissors without pain or bleeding prior to examination of ulcer. Wound NTZ:GYFVCB with purple discoloration consistent with that of sDTI Drainage (amount, consistency, odor) None Periwound:intact with newly reepithelialized tissue  Dressing procedure/placement/frequency: I will place orders for nursing to wash and dry the feet twice weekly with antimicrobial soap and water, then follow with the placement of a soft silicone foam dressing.  Feet will be placed in  Pressure redistribution boots per house protocol.  These are ordered for him today. Granddaughter is with him at this time.  It is noted that Urology (specifically, Dr. Janice Norrie) saw patient this am for the penile wound (medical device related pressure ulcer (MDRPr), POA) and that topical silvadine cream has been ordered for this lesion Protection nursing team will not follow, but will remain available to this patient, the nursing and medical team.  Please re-consult if needed. Thanks, Maudie Flakes, MSN, RN, [place], [place], [place] [phone])

## 2014-06-23 NOTE — Progress Notes (Signed)
Utilization Review Completed.Joshua Norton T7/11/2014  

## 2014-06-24 ENCOUNTER — Encounter (HOSPITAL_COMMUNITY): Payer: Self-pay | Admitting: General Practice

## 2014-06-24 ENCOUNTER — Inpatient Hospital Stay (HOSPITAL_COMMUNITY): Payer: Medicare HMO

## 2014-06-24 ENCOUNTER — Ambulatory Visit: Payer: Medicare HMO | Admitting: Cardiology

## 2014-06-24 DIAGNOSIS — A419 Sepsis, unspecified organism: Principal | ICD-10-CM

## 2014-06-24 DIAGNOSIS — E2749 Other adrenocortical insufficiency: Secondary | ICD-10-CM

## 2014-06-24 LAB — CBC
HCT: 26.7 % — ABNORMAL LOW (ref 39.0–52.0)
HEMOGLOBIN: 9.5 g/dL — AB (ref 13.0–17.0)
MCH: 29.1 pg (ref 26.0–34.0)
MCHC: 35.6 g/dL (ref 30.0–36.0)
MCV: 81.7 fL (ref 78.0–100.0)
Platelets: 322 10*3/uL (ref 150–400)
RBC: 3.27 MIL/uL — ABNORMAL LOW (ref 4.22–5.81)
RDW: 15.1 % (ref 11.5–15.5)
WBC: 26.6 10*3/uL — ABNORMAL HIGH (ref 4.0–10.5)

## 2014-06-24 LAB — COMPREHENSIVE METABOLIC PANEL
ALT: 11 U/L (ref 0–53)
ANION GAP: 16 — AB (ref 5–15)
AST: 16 U/L (ref 0–37)
Albumin: 1.9 g/dL — ABNORMAL LOW (ref 3.5–5.2)
Alkaline Phosphatase: 290 U/L — ABNORMAL HIGH (ref 39–117)
BUN: 47 mg/dL — AB (ref 6–23)
CO2: 18 mEq/L — ABNORMAL LOW (ref 19–32)
Calcium: 7.7 mg/dL — ABNORMAL LOW (ref 8.4–10.5)
Chloride: 98 mEq/L (ref 96–112)
Creatinine, Ser: 1.19 mg/dL (ref 0.50–1.35)
GFR calc non Af Amer: 51 mL/min — ABNORMAL LOW (ref >90)
GFR, EST AFRICAN AMERICAN: 59 mL/min — AB (ref >90)
Glucose, Bld: 104 mg/dL — ABNORMAL HIGH (ref 70–99)
Potassium: 3.6 mEq/L — ABNORMAL LOW (ref 3.7–5.3)
SODIUM: 132 meq/L — AB (ref 137–147)
TOTAL PROTEIN: 5.4 g/dL — AB (ref 6.0–8.3)
Total Bilirubin: 0.6 mg/dL (ref 0.3–1.2)

## 2014-06-24 LAB — LACTIC ACID, PLASMA: LACTIC ACID, VENOUS: 0.7 mmol/L (ref 0.5–2.2)

## 2014-06-24 LAB — CG4 I-STAT (LACTIC ACID): Lactic Acid, Venous: 1.19 mmol/L (ref 0.5–2.2)

## 2014-06-24 LAB — TROPONIN I: Troponin I: <0.3 ng/mL (ref <0.30)

## 2014-06-24 LAB — PROCALCITONIN: Procalcitonin: 5.62 ng/mL

## 2014-06-24 MED ORDER — HYDROCORTISONE NA SUCCINATE PF 100 MG IJ SOLR
50.0000 mg | Freq: Four times a day (QID) | INTRAMUSCULAR | Status: DC
  Administered 2014-06-24 – 2014-06-25 (×5): 50 mg via INTRAVENOUS
  Filled 2014-06-24 (×8): qty 1

## 2014-06-24 MED ORDER — ENOXAPARIN SODIUM 40 MG/0.4ML ~~LOC~~ SOLN
40.0000 mg | Freq: Every day | SUBCUTANEOUS | Status: DC
  Administered 2014-06-25 – 2014-06-26 (×2): 40 mg via SUBCUTANEOUS
  Filled 2014-06-24 (×2): qty 0.4

## 2014-06-24 MED ORDER — POTASSIUM CHLORIDE CRYS ER 20 MEQ PO TBCR
40.0000 meq | EXTENDED_RELEASE_TABLET | Freq: Once | ORAL | Status: AC
  Administered 2014-06-24: 40 meq via ORAL
  Filled 2014-06-24: qty 2

## 2014-06-24 NOTE — Evaluation (Signed)
Physical Therapy Evaluation Patient Details Name: Joshua Norton MRN: [id number] DOB: 03/07/1919 Today's Date: 06/24/2014   History of Present Illness  Pt adm with sepsis, UTI. PMH - cardiomyopathy, rt shoulder pain, dementia. Pt was hospitalized last month and was dc'd to SNF where he has remained until this admission.   Clinical Impression  Pt admitted with above. Pt currently with functional limitations due to the deficits listed below (see PT Problem List).  Pt will benefit from skilled PT to increase their independence and safety with mobility to allow discharge back to SNF.     Follow Up Recommendations SNF    Equipment Recommendations  None recommended by PT    Recommendations for Other Services       Precautions / Restrictions Precautions Precautions: Fall      Mobility  Bed Mobility                  Transfers Overall transfer level: Needs assistance Equipment used:  (maxisky)             General transfer comment: Used maxisky for bed to chair transfer due to pt unable to bear enough weight on his feet to bring hips up off of bed wtih 2 person assist.  Ambulation/Gait                Stairs            Wheelchair Mobility    Modified Rankin (Stroke Patients Only)       Balance                                             Pertinent Vitals/Pain Rt shoulder pain with any touch or movement. VSS    Home Living Family/patient expects to be discharged to:: Skilled nursing facility                 Additional Comments: Prior to hospitalization and dc to SNF last month pt lived alone; had aide 2 days a week and granddaughter who stayed with him "sometimes"     Prior Function Level of Independence: Needs assistance   Gait / Transfers Assistance Needed: Pt reports he was standing and amb "some" with rolling walker and assist at SNF.           Hand Dominance   Dominant Hand: Right    Extremity/Trunk  Assessment   Upper Extremity Assessment: Defer to OT evaluation           Lower Extremity Assessment: Generalized weakness         Communication   Communication: HOH  Cognition Arousal/Alertness: Awake/alert Behavior During Therapy: WFL for tasks assessed/performed Overall Cognitive Status: No family/caregiver present to determine baseline cognitive functioning                      General Comments      Exercises General Exercises - Lower Extremity Ankle Circles/Pumps: AROM;Both;10 reps;Seated Heel Slides: AAROM;Both;5 reps;Seated Hip ABduction/ADduction: AAROM;Both;5 reps;Seated      Assessment/Plan    PT Assessment    PT Diagnosis     PT Problem List    PT Treatment Interventions     PT Goals (Current goals can be found in the Care Plan section) Acute Rehab PT Goals Patient Stated Goal: Pt agreeable to working on incr mobility PT Goal Formulation: With patient Time For Goal Achievement:  07/08/14 Potential to Achieve Goals: Fair    Frequency     Barriers to discharge        Co-evaluation               End of Session Equipment Utilized During Treatment:  Herma Mering) Activity Tolerance: Patient limited by fatigue;Patient limited by pain Patient left: in chair;with call bell/phone within reach;with chair alarm set Nurse Communication: Mobility status;Need for lift equipment         Time: 1057-1105 PT Time Calculation (min): 8 min   Charges:   PT Evaluation $Initial PT Evaluation Tier I: 1 Procedure     PT G Codes:          Sejal Cofield July 14, 2014, 12:36 PM  [facility] PT [phone]

## 2014-06-24 NOTE — Progress Notes (Signed)
06/24/14 1300  OT Visit Information  06/24/14  +2  80 yo male admitted from SNF due to Lt heel wound stage III , Rt shoulder pain x2 falls , BIL Le edema, necrotic tissue on tip of penis and sepsis due to UTI.   Precautions  [place]  Per pt currenlty from [facility]  Prior Function  Needs assistance  pt reports sponge bath from aide  Communication  [place] for tasks assessed/performed  Impaired/Different from baseline  Memory;Orientation;Awareness;Problem solving;Safety/judgement  Disoriented to;Time;Situation  Decreased short-term memory  Decreased awareness of safety;Decreased awareness of deficits  Intellectual  Slow processing  Pt reports being at [facility] but unable to explain to therapist reasons for admission. pt states "my legs look swollen to me" Pt unaware of deficits and reports being able to walk however was unable to achieve full upright static standing  Upper Extremity Assessment  RUE deficits/detail  AROM 30 degrees. pt using LT UE to help AAROM 60 degrees. Pt screams in anticipation for any tactile input from therapist. Pt wouild not allow unassisted PROM. Pt with pain limiting  Unable to fully assess due to pain  decreased gross motor  Lower Extremity Assessment  Defer to PT evaluation  Cervical / Trunk Assessment  Kyphotic  ADL  Needs assistance/impaired  Moderate assistance  Total assistance  Total assistance (pt requires hoyer lift to transfer to chair level)  Pt requires (A) to complete supine <>Sit EOB. Pt static sitting min guard (A) for ~3 minutes. pt was unable to achieve static standing x3 attempts with bed elevated and pt used for hip extension. Pt with strong posterior lean and pushing with LT UE  Vision- History  Glaucoma  No change from baseline  Bed Mobility  Needs Assistance  Supine to Sit  Max assist  Difficulty with trunk activation and RT UE painful  with any use. Pt exiting bed on LT side and unable to use LT UE to push trunk off bed surface.   Transfers  Needs assistance  Sit to/from Stand  +2 physical assistance;Total assist (requires hoyer lift)  pt required hoyer lift to chair due to inability to static stand  Balance  Needs assistance  Single extremity supported;Feet supported  Poor  Posterior lean  OT - End of Session  Gait belt;Other (comment) (hoyer lift)  Patient tolerated treatment well  in chair;with call bell/phone within reach;with chair alarm set;with nursing/sitter in room  Mobility status;Need for lift equipment;Precautions  OT Assessment  Patient needs continued OT Services  Decreased strength;Decreased activity tolerance;Decreased range of motion;Impaired balance (sitting and/or standing);Decreased knowledge of use of DME or AE;Decreased safety awareness;Decreased cognition;Pain;Impaired UE functional use;Obesity  Generalized weakness;Cognitive deficits;Acute pain  OT Plan  Min 2X/week  Self-care/ADL training;Therapeutic exercise;DME and/or AE instruction;Neuromuscular education;Therapeutic activities;Patient/family education;Balance training;Cognitive remediation/compensation  OT Recommendation  SNF  Other (comment) (defer SNF)  Individuals Consulted  Patient unable/family or caregiver not available  Acute Rehab OT Goals  none specifically stated  Patient unable to participate in goal setting  07/08/14  Good  OT Time Calculation  1045  1105  20 min  OT General Charges  1 Procedure  OT Evaluation  1 Procedure  OT Treatments  8-22 mins  Written Expression  Right     Jeri Modena   OTR/L Pager: [phone] Office: [phone] .

## 2014-06-24 NOTE — Progress Notes (Signed)
Subjective: Patient reports No penile pain.  Objective: Vital signs in last 24 hours: Temp:  [97.4 F (36.3 C)-97.9 F (36.6 C)] 97.4 F (36.3 C) (07/13 1618) Pulse Rate:  [60-81] 71 (07/13 1500) Resp:  [10-19] 16 (07/13 1500) BP: (66-118)/(38-74) 115/52 mmHg (07/13 1500) SpO2:  [97 %-100 %] 100 % (07/13 1500) Weight:  [83.3 kg (183 lb 10.3 oz)] 83.3 kg (183 lb 10.3 oz) (07/13 0600)  Intake/Output from previous day: 07/12 0701 - 07/13 0700 In: 3440 [P.O.:240; I.V.:1600; IV Piggyback:1600] Out: 4 [Urine:4] Intake/Output this shift: Total I/O In: 1210 [P.O.:360; I.V.:800; IV Piggyback:50] Out: 2 [Urine:2]  Physical Exam:  Glans penis looks better today.  Less necrotic tissue. Voids on his own. Creatinine: 1.19  Lab Results:  Recent Labs  06/23/14 0623 06/23/14 1620 06/24/14 0309  HGB 11.7* 10.2* 9.5*  HCT 33.4* 28.8* 26.7*   BMET  Recent Labs  06/23/14 0623 06/24/14 0309  NA 127* 132*  K 4.0 3.6*  CL 88* 98  CO2 18* 18*  GLUCOSE 100* 104*  BUN 58* 47*  CREATININE 1.38* 1.19  CALCIUM 8.5 7.7*    Recent Labs  06/23/14 1620  INR 1.52*   No results found for this basename: LABURIN,  in the last 72 hours Results for orders placed during the hospital encounter of 06/22/14  URINE CULTURE     Status: None   Collection Time    06/22/14 10:52 PM      Result Value Ref Range Status   Specimen Description URINE, RANDOM   Final   Special Requests NONE   Final   Culture  Setup Time     Final   Value: 06/23/2014 16:55     Performed at [place] PENDING   Incomplete   Culture     Final   Value: Culture reincubated for better growth     Performed at [facility]   Report Status PENDING   Incomplete  MRSA PCR SCREENING     Status: None   Collection Time    06/23/14  3:01 PM      Result Value Ref Range Status   MRSA by PCR NEGATIVE  NEGATIVE Final   Comment:            The GeneXpert MRSA Assay (FDA     approved for  NASAL specimens     only), is one component of a     comprehensive MRSA colonization     surveillance program. It is not     intended to diagnose MRSA     infection nor to guide or     monitor treatment for     MRSA infections.    Studies/Results: Dg Chest 2 View  06/22/2014   CLINICAL DATA:  Shoulder pain.  Cardiomyopathy.  EXAM: CHEST  2 VIEW  COMPARISON:  05/16/2014  FINDINGS: Stable cardiomegaly and mediastinal contours. Lateral view limited by the patient's arm thickening rays. Pulmonary vascularity is normal. The lungs are normally expanded and clear. No airspace disease or pleural effusion. Negative for pneumothorax. 3 mm radiopaque foreign body projects over the soft tissues of the left neck on the frontal view. No acute osseous abnormality identified. Please note that the majority of the right shoulder is excluded from this image.  IMPRESSION: Stable mild cardiomegaly.  No acute findings in the chest.   Electronically Signed   By: Curlene Dolphin M.D.   On: 06/22/2014 22:01   Dg Shoulder Right  06/22/2014  CLINICAL DATA:  Right shoulder pain and limited range of motion after a fall.  EXAM: RIGHT SHOULDER - 2+ VIEW  COMPARISON:  03/23/2014  FINDINGS: Degenerative changes in the glenohumeral and acromioclavicular joints. Decreased subacromial space consistent with chronic rotator cuff arthropathy. No evidence of acute fracture or dislocation of the right shoulder.  IMPRESSION: Degenerative changes in the right shoulder with probable chronic rotator cuff arthropathy. No acute bony abnormalities identified 3   Electronically Signed   By: [name] M.D.   On: 06/22/2014 22:02   Ct Head Wo Contrast  06/22/2014   CLINICAL DATA:  Neck pain after a fall.  EXAM: CT HEAD WITHOUT CONTRAST  CT CERVICAL SPINE WITHOUT CONTRAST  TECHNIQUE: Multidetector CT imaging of the head and cervical spine was performed following the standard protocol without intravenous contrast. Multiplanar CT image  reconstructions of the cervical spine were also generated.  COMPARISON:  None.  FINDINGS: CT HEAD FINDINGS  Prominent diffuse cerebral atrophy. Ventricular dilatation is likely due to central atrophy. Low-attenuation changes in the deep white matter consistent with small vessel ischemia. No mass effect or midline shift. No abnormal extra-axial fluid collections. Gray-white matter junctions are distinct. Basal cisterns are not effaced. No evidence of acute intracranial hemorrhage. No depressed skull fractures. Visualized paranasal sinuses and mastoid air cells are not opacified.  CT CERVICAL SPINE FINDINGS  Straightening of the usual cervical lordosis which may be due to patient positioning but ligamentous injury or muscle spasm can also have this appearance. There is diffuse degenerative change throughout the cervical spine with diffuse narrowing of cervical interspaces and associated endplate hypertrophic changes. Prominent disc osteophyte complexes at C3-4, C4-5, C5-6, and C6-7 levels. C1-2 articulation appears intact. There is diffuse heterogeneous sclerosis and lucency demonstrated throughout the cervical spine and in the visualized upper thoracic vertebra. This is likely to indicate diffuse bone metastasis. Renal osteodystrophy, metabolic abnormalities, or severe osteoporosis could also potentially have this appearance. Correlation with PSA is recommended.  IMPRESSION: No acute intracranial abnormalities. Chronic atrophy and small vessel ischemic changes.  Nonspecific straightening of the usual cervical lordosis. Diffuse degenerative change throughout the cervical spine. Heterogeneous lytic and sclerotic changes throughout the visualized spine worrisome for bone metastasis.   Electronically Signed   By: [name] M.D.   On: 06/22/2014 22:19   Ct Cervical Spine Wo Contrast  06/22/2014   CLINICAL DATA:  Neck pain after a fall.  EXAM: CT HEAD WITHOUT CONTRAST  CT CERVICAL SPINE WITHOUT CONTRAST   TECHNIQUE: Multidetector CT imaging of the head and cervical spine was performed following the standard protocol without intravenous contrast. Multiplanar CT image reconstructions of the cervical spine were also generated.  COMPARISON:  None.  FINDINGS: CT HEAD FINDINGS  Prominent diffuse cerebral atrophy. Ventricular dilatation is likely due to central atrophy. Low-attenuation changes in the deep white matter consistent with small vessel ischemia. No mass effect or midline shift. No abnormal extra-axial fluid collections. Gray-white matter junctions are distinct. Basal cisterns are not effaced. No evidence of acute intracranial hemorrhage. No depressed skull fractures. Visualized paranasal sinuses and mastoid air cells are not opacified.  CT CERVICAL SPINE FINDINGS  Straightening of the usual cervical lordosis which may be due to patient positioning but ligamentous injury or muscle spasm can also have this appearance. There is diffuse degenerative change throughout the cervical spine with diffuse narrowing of cervical interspaces and associated endplate hypertrophic changes. Prominent disc osteophyte complexes at C3-4, C4-5, C5-6, and C6-7 levels. C1-2 articulation appears intact. There is  diffuse heterogeneous sclerosis and lucency demonstrated throughout the cervical spine and in the visualized upper thoracic vertebra. This is likely to indicate diffuse bone metastasis. Renal osteodystrophy, metabolic abnormalities, or severe osteoporosis could also potentially have this appearance. Correlation with PSA is recommended.  IMPRESSION: No acute intracranial abnormalities. Chronic atrophy and small vessel ischemic changes.  Nonspecific straightening of the usual cervical lordosis. Diffuse degenerative change throughout the cervical spine. Heterogeneous lytic and sclerotic changes throughout the visualized spine worrisome for bone metastasis.   Electronically Signed   By: [name] M.D.   On: 06/22/2014 22:19    Dg Chest Port 1 View  06/24/2014   CLINICAL DATA:  Cardiomyopathy  EXAM: PORTABLE CHEST - 1 VIEW  COMPARISON:  06/22/2014  FINDINGS: Cardiac shadow is again mildly enlarged. The lungs are well aerated bilaterally. No focal confluent infiltrate is seen.  IMPRESSION: Given some technical variations in the image, no significant interval change is noted.   Electronically Signed   By: Inez Catalina M.D.   On: 06/24/2014 07:50   Dg Humerus Right  06/22/2014   CLINICAL DATA:  Right shoulder pain and limited range of motion after a fall.  EXAM: RIGHT HUMERUS - 2+ VIEW  COMPARISON:  Right shoulder 03/23/2014  FINDINGS: Degenerative changes in the right shoulder. No evidence of acute fracture or dislocation of the humerus. Soft tissues are unremarkable.  IMPRESSION: No acute bony abnormalities.   Electronically Signed   By: [name] M.D.   On: 06/22/2014 22:11    Assessment/Plan: Necrosis of meatus secondary to indwelling Foley. Improving. Continue Silvadene cream.  Leave Foley out. Will sign off.  Please call if needed.   LOS: 2 days   Arvil Persons 06/24/2014, 6:02 PM

## 2014-06-24 NOTE — Consult Note (Addendum)
PULMONARY / CRITICAL CARE MEDICINE  Name: Joshua Norton MRN: [id number] DOB: Oct 24, 1919    ADMISSION DATE:  06/22/2014 CONSULTATION DATE:  06/23/2014  REFERRING MD :  Karleen Hampshire PRIMARY SERVICE:  PCCM  CHIEF COMPLAINT:  Septic shock   BRIEF PATIENT DESCRIPTION: 80 yo admitted on 7/11 with right shoulder pain, encephalopathy, hypotension and falls.  SIGNIFICANT EVENTS / STUDIES:  7/11 Head CT >>> nad 7/11 C-spine CT >>> Degenerative changes. Heterogeneous lytic and sclerotic changes throughout the visualized spine worrisome for bone metastasis.  LINES / TUBES:  CULTURES: 7/11  Urine >>> 7/12  MRSA PCR >>> neg  ANTIBIOTICS: Zosyn 7/11>>> Vancomycin 7/12 >>>  INTERVAL HISTORY: Normotensive, never required pressors.  VITAL SIGNS: Temp:  [97.4 F (36.3 C)-97.9 F (36.6 C)] 97.4 F (36.3 C) (07/13 0742) Pulse Rate:  [27-85] 70 (07/13 0800) Resp:  [10-18] 15 (07/13 0800) BP: (66-110)/(38-63) 87/54 mmHg (07/13 0800) SpO2:  [81 %-100 %] 100 % (07/13 0800) Weight:  [83 kg (182 lb 15.7 oz)-83.3 kg (183 lb 10.3 oz)] 83.3 kg (183 lb 10.3 oz) (07/13 0600)  HEMODYNAMICS:   VENTILATOR SETTINGS:   INTAKE / OUTPUT: Intake/Output     07/12 0701 - 07/13 0700 07/13 0701 - 07/14 0700   P.O. 240    I.V. (mL/kg) 1600 (19.2) 100 (1.2)   IV Piggyback 1600    Total Intake(mL/kg) 3440 (41.3) 100 (1.2)   Urine (mL/kg/hr) 4 (0)    Total Output 4     Net +3436 +100        Urine Occurrence 4 x      PHYSICAL EXAMINATION: General:  No distress Neuro:  Awake, alert HEENT:  Poor dentition, no JVD Cardiovascular:  Regular, no murmurs Lungs:  CTA  Abdomen:  Soft, non tender Musculoskeletal:  Moves all extremities, trace edema Skin:  Penis excoriated, decub ulcer L heel  LABS: CBC  Recent Labs Lab 06/23/14 0623 06/23/14 1620 06/24/14 0309  WBC 13.2* 29.8* 26.6*  HGB 11.7* 10.2* 9.5*  HCT 33.4* 28.8* 26.7*  PLT 333 347 322   Coag's  Recent Labs Lab 06/23/14 1620  INR 1.52*    BMET  Recent Labs Lab 06/22/14 2008 06/23/14 0623 06/24/14 0309  NA 125* 127* 132*  K 4.0 4.0 3.6*  CL 86* 88* 98  CO2 22 18* 18*  BUN 59* 58* 47*  CREATININE 1.48* 1.38* 1.19  GLUCOSE 111* 100* 104*   Electrolytes  Recent Labs Lab 06/22/14 2008 06/23/14 0623 06/24/14 0309  CALCIUM 8.2* 8.5 7.7*   Sepsis Markers  Recent Labs Lab 06/23/14 0002 06/23/14 1620 06/24/14 0309  LATICACIDVEN 1.19 1.2 0.7  PROCALCITON  --  5.61 5.62   ABG No results found for this basename: PHART, PCO2ART, PO2ART,  in the last 168 hours  Liver Enzymes  Recent Labs Lab 06/23/14 1620 06/24/14 0309  AST 20 16  ALT 13 11  ALKPHOS 314* 290*  BILITOT 0.7 0.6  ALBUMIN 2.0* 1.9*   Cardiac Enzymes  Recent Labs Lab 06/22/14 2008 06/24/14 0309  TROPONINI <0.30 <0.30   Glucose No results found for this basename: GLUCAP,  in the last 168 hours  IMAGING:  Dg Chest 2 View  06/22/2014   CLINICAL DATA:  Shoulder pain.  Cardiomyopathy.  EXAM: CHEST  2 VIEW  COMPARISON:  05/16/2014  FINDINGS: Stable cardiomegaly and mediastinal contours. Lateral view limited by the patient's arm thickening rays. Pulmonary vascularity is normal. The lungs are normally expanded and clear. No airspace disease or pleural effusion. Negative for  pneumothorax. 3 mm radiopaque foreign body projects over the soft tissues of the left neck on the frontal view. No acute osseous abnormality identified. Please note that the majority of the right shoulder is excluded from this image.  IMPRESSION: Stable mild cardiomegaly.  No acute findings in the chest.   Electronically Signed   By: Curlene Dolphin M.D.   On: 06/22/2014 22:01   Dg Shoulder Right  06/22/2014   CLINICAL DATA:  Right shoulder pain and limited range of motion after a fall.  EXAM: RIGHT SHOULDER - 2+ VIEW  COMPARISON:  03/23/2014  FINDINGS: Degenerative changes in the glenohumeral and acromioclavicular joints. Decreased subacromial space consistent with chronic  rotator cuff arthropathy. No evidence of acute fracture or dislocation of the right shoulder.  IMPRESSION: Degenerative changes in the right shoulder with probable chronic rotator cuff arthropathy. No acute bony abnormalities identified 3   Electronically Signed   By: [name] M.D.   On: 06/22/2014 22:02   Ct Head Wo Contrast  06/22/2014   CLINICAL DATA:  Neck pain after a fall.  EXAM: CT HEAD WITHOUT CONTRAST  CT CERVICAL SPINE WITHOUT CONTRAST  TECHNIQUE: Multidetector CT imaging of the head and cervical spine was performed following the standard protocol without intravenous contrast. Multiplanar CT image reconstructions of the cervical spine were also generated.  COMPARISON:  None.  FINDINGS: CT HEAD FINDINGS  Prominent diffuse cerebral atrophy. Ventricular dilatation is likely due to central atrophy. Low-attenuation changes in the deep white matter consistent with small vessel ischemia. No mass effect or midline shift. No abnormal extra-axial fluid collections. Gray-white matter junctions are distinct. Basal cisterns are not effaced. No evidence of acute intracranial hemorrhage. No depressed skull fractures. Visualized paranasal sinuses and mastoid air cells are not opacified.  CT CERVICAL SPINE FINDINGS  Straightening of the usual cervical lordosis which may be due to patient positioning but ligamentous injury or muscle spasm can also have this appearance. There is diffuse degenerative change throughout the cervical spine with diffuse narrowing of cervical interspaces and associated endplate hypertrophic changes. Prominent disc osteophyte complexes at C3-4, C4-5, C5-6, and C6-7 levels. C1-2 articulation appears intact. There is diffuse heterogeneous sclerosis and lucency demonstrated throughout the cervical spine and in the visualized upper thoracic vertebra. This is likely to indicate diffuse bone metastasis. Renal osteodystrophy, metabolic abnormalities, or severe osteoporosis could also  potentially have this appearance. Correlation with PSA is recommended.  IMPRESSION: No acute intracranial abnormalities. Chronic atrophy and small vessel ischemic changes.  Nonspecific straightening of the usual cervical lordosis. Diffuse degenerative change throughout the cervical spine. Heterogeneous lytic and sclerotic changes throughout the visualized spine worrisome for bone metastasis.   Electronically Signed   By: [name] M.D.   On: 06/22/2014 22:19   Ct Cervical Spine Wo Contrast  06/22/2014   CLINICAL DATA:  Neck pain after a fall.  EXAM: CT HEAD WITHOUT CONTRAST  CT CERVICAL SPINE WITHOUT CONTRAST  TECHNIQUE: Multidetector CT imaging of the head and cervical spine was performed following the standard protocol without intravenous contrast. Multiplanar CT image reconstructions of the cervical spine were also generated.  COMPARISON:  None.  FINDINGS: CT HEAD FINDINGS  Prominent diffuse cerebral atrophy. Ventricular dilatation is likely due to central atrophy. Low-attenuation changes in the deep white matter consistent with small vessel ischemia. No mass effect or midline shift. No abnormal extra-axial fluid collections. Gray-white matter junctions are distinct. Basal cisterns are not effaced. No evidence of acute intracranial hemorrhage. No depressed skull fractures. Visualized paranasal  sinuses and mastoid air cells are not opacified.  CT CERVICAL SPINE FINDINGS  Straightening of the usual cervical lordosis which may be due to patient positioning but ligamentous injury or muscle spasm can also have this appearance. There is diffuse degenerative change throughout the cervical spine with diffuse narrowing of cervical interspaces and associated endplate hypertrophic changes. Prominent disc osteophyte complexes at C3-4, C4-5, C5-6, and C6-7 levels. C1-2 articulation appears intact. There is diffuse heterogeneous sclerosis and lucency demonstrated throughout the cervical spine and in the visualized  upper thoracic vertebra. This is likely to indicate diffuse bone metastasis. Renal osteodystrophy, metabolic abnormalities, or severe osteoporosis could also potentially have this appearance. Correlation with PSA is recommended.  IMPRESSION: No acute intracranial abnormalities. Chronic atrophy and small vessel ischemic changes.  Nonspecific straightening of the usual cervical lordosis. Diffuse degenerative change throughout the cervical spine. Heterogeneous lytic and sclerotic changes throughout the visualized spine worrisome for bone metastasis.   Electronically Signed   By: [name] M.D.   On: 06/22/2014 22:19   Dg Chest Port 1 View  06/24/2014   CLINICAL DATA:  Cardiomyopathy  EXAM: PORTABLE CHEST - 1 VIEW  COMPARISON:  06/22/2014  FINDINGS: Cardiac shadow is again mildly enlarged. The lungs are well aerated bilaterally. No focal confluent infiltrate is seen.  IMPRESSION: Given some technical variations in the image, no significant interval change is noted.   Electronically Signed   By: Inez Catalina M.D.   On: 06/24/2014 07:50   Dg Humerus Right  06/22/2014   CLINICAL DATA:  Right shoulder pain and limited range of motion after a fall.  EXAM: RIGHT HUMERUS - 2+ VIEW  COMPARISON:  Right shoulder 03/23/2014  FINDINGS: Degenerative changes in the right shoulder. No evidence of acute fracture or dislocation of the humerus. Soft tissues are unremarkable.  IMPRESSION: No acute bony abnormalities.   Electronically Signed   By: [name] M.D.   On: 06/22/2014 22:11   ASSESSMENT / PLAN:  PULMONARY A: No active issues P:   Goal SpO2>92 Supplemental oxygen PRN  CARDIOVASCULAR A:  Sepsis Chronic systolic / diastolic heart failure P:  Goal MAP>60 ASA  RENAL A:   Acute renal failure, resolved Hypokalemia  P:   Trend BMP K 40 NS@100   GASTROINTESTINAL A:   Nutrition GERD P:   Diet Protonix  HEMATOLOGIC A:   Anemia of chronic disease VTE Px P:  Trend CBC   Lovenox  INFECTIOUS A:   UTI P:   Abx / cx as above D/c Vancomycin ( MRSA PCR neg and unlikely to cause UTI )  ENDOCRINE A:   Adrenal insufficiency ( cortisol 14 in presence of hypotension ), alc phos elevated P:   Hydrocortisone stress dose  NEUROLOGIC A:   Severe deconditioning Shoulder pain 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 P:   PT / OT Tylenol / Vicodin Needs malignancy work up, urology consult? PSA  Transfer to SDU 7/13. TRH to assume care 7/14.  I have personally obtained history, examined patient, evaluated and interpreted laboratory and imaging results, reviewed medical records, formulated assessment / plan and placed orders.  Doree Fudge, MD Pulmonary and [place] Pager: [phone]  06/24/2014, 11:16 AM

## 2014-06-24 NOTE — Evaluation (Signed)
Occupational Therapy Evaluation Patient Details Name: Joshua Norton MRN: [id number] DOB: 1919/11/15 Today's Date: 06/24/2014    History of Present Illness 80 yo male admitted from SNF due to Lt heel wound stage III , Rt shoulder pain x2 falls , BIL Le edema, necrotic tissue on tip of penis and sepsis due to UTI.    Clinical Impression   PT admitted with sepsis due to UTI. Pt currently with functional limitiations due to the deficits listed below (see OT problem list). FROM snf and recommending returning to SNF Pt will benefit from skilled OT to increase their independence and safety with adls and balance to allow discharge SNF.     Follow Up Recommendations  SNF    Equipment Recommendations  Other (comment) (defer SNF)    Recommendations for Other Services       Precautions / Restrictions Precautions Precautions: Fall      Mobility Bed Mobility Overal bed mobility: Needs Assistance Bed Mobility: Supine to Sit     Supine to sit: Max assist     General bed mobility comments: difficulty with trunk activation and RT UE painful with any use. Pt exiting bed on LT side and unable to use LT UE to push trunk off bed surface.   Transfers Overall transfer level: Needs assistance   Transfers: Sit to/from Stand Sit to Stand: +2 physical assistance;Total assist (requires hoyer lift)         General transfer comment: pt required hoyer lift to chair due to inability to static stand    Balance Overall balance assessment: Needs assistance Sitting-balance support: Single extremity supported;Feet supported Sitting balance-Leahy Scale: Poor   Postural control: Posterior lean                                  ADL Overall ADL's : Needs assistance/impaired         Upper Body Bathing: Moderate assistance   Lower Body Bathing: Total assistance           Toilet Transfer: Total assistance (pt requires hoyer lift to transfer to chair level)              General ADL Comments: Pt requires (A) to complete supine <>Sit EOB. Pt static sitting min guard (A) for ~3 minutes. pt was unable to achieve static standing x3 attempts with bed elevated and pt used for hip extension. Pt with strong posterior lean and pushing with LT UE     Vision                     Perception     Praxis      Pertinent Vitals/Pain VSS     Hand Dominance Right   Extremity/Trunk Assessment Upper Extremity Assessment Upper Extremity Assessment: RUE deficits/detail RUE Deficits / Details: AROM 30 degrees. pt using LT UE to help AAROM 60 degrees. Pt screams in anticipation for any tactile input from therapist. Pt wouild not allow unassisted PROM. Pt with pain limiting RUE: Unable to fully assess due to pain RUE Coordination: decreased gross motor   Lower Extremity Assessment Lower Extremity Assessment: Defer to PT evaluation   Cervical / Trunk Assessment Cervical / Trunk Assessment: Kyphotic   Communication Communication Communication: HOH   Cognition Arousal/Alertness: Awake/alert Behavior During Therapy: WFL for tasks assessed/performed Overall Cognitive Status: Impaired/Different from baseline Area of Impairment: Memory;Orientation;Awareness;Problem solving;Safety/judgement Orientation Level: Disoriented to;Time;Situation   Memory: Decreased short-term memory  Safety/Judgement: Decreased awareness of safety;Decreased awareness of deficits Awareness: Intellectual Problem Solving: Slow processing General Comments: Pt reports being at [facility] but unable to explain to therapist reasons for admission. pt states "my legs look swollen to me" Pt unaware of deficits and reports being able to walk however was unable to achieve full upright static standing   General Comments       Exercises       Shoulder Instructions      Home Living Family/patient expects to be discharged to:: Skilled nursing facility                                  Additional Comments: Per pt currenlty from [facility] SNF      Prior Functioning/Environment Level of Independence: Needs assistance    ADL's / Homemaking Assistance Needed: pt reports sponge bath from aide        OT Diagnosis: Generalized weakness;Cognitive deficits;Acute pain   OT Problem List: Decreased strength;Decreased activity tolerance;Decreased range of motion;Impaired balance (sitting and/or standing);Decreased knowledge of use of DME or AE;Decreased safety awareness;Decreased cognition;Pain;Impaired UE functional use;Obesity   OT Treatment/Interventions: Self-care/ADL training;Therapeutic exercise;DME and/or AE instruction;Neuromuscular education;Therapeutic activities;Patient/family education;Balance training;Cognitive remediation/compensation    OT Goals(Current goals can be found in the care plan section) Acute Rehab OT Goals Patient Stated Goal: none specifically stated OT Goal Formulation: Patient unable to participate in goal setting Time For Goal Achievement: 07/08/14 Potential to Achieve Goals: Good  OT Frequency: Min 2X/week   Barriers to D/C:            Co-evaluation              End of Session Equipment Utilized During Treatment: Gait belt;Other (comment) (hoyer lift) Nurse Communication: Mobility status;Need for lift equipment;Precautions  Activity Tolerance: Patient tolerated treatment well Patient left: in chair;with call bell/phone within reach;with chair alarm set;with nursing/sitter in room   Time: 1045-1105 OT Time Calculation (min): 20 min Charges:  OT General Charges $OT Visit: 1 Procedure OT Evaluation $Initial OT Evaluation Tier I: 1 Procedure OT Treatments $Therapeutic Activity: 8-22 mins G-Codes:    Parke Poisson B 07-23-2014, 1:49 PM Pager: [phone]

## 2014-06-25 ENCOUNTER — Inpatient Hospital Stay (HOSPITAL_COMMUNITY): Payer: Medicare HMO

## 2014-06-25 DIAGNOSIS — F0390 Unspecified dementia without behavioral disturbance: Secondary | ICD-10-CM | POA: Diagnosis present

## 2014-06-25 DIAGNOSIS — M949 Disorder of cartilage, unspecified: Secondary | ICD-10-CM

## 2014-06-25 DIAGNOSIS — M899 Disorder of bone, unspecified: Secondary | ICD-10-CM

## 2014-06-25 LAB — FREE PSA
PSA, Free Pct: 9 % — ABNORMAL LOW (ref >25)
PSA, Free: 2.83 ng/mL

## 2014-06-25 LAB — PROCALCITONIN: PROCALCITONIN: 3.48 ng/mL

## 2014-06-25 LAB — PSA
PSA: 32.21 ng/mL — AB (ref <=4.00)
PSA: 31.06 ng/mL — ABNORMAL HIGH (ref <=4.00)

## 2014-06-25 LAB — URINE CULTURE: Colony Count: >=100000

## 2014-06-25 MED ORDER — CEFTRIAXONE SODIUM 1 G IJ SOLR
1.0000 g | INTRAMUSCULAR | Status: DC
  Administered 2014-06-26: 1 g via INTRAVENOUS
  Filled 2014-06-25 (×3): qty 10

## 2014-06-25 MED ORDER — HYDROCORTISONE NA SUCCINATE PF 100 MG IJ SOLR
50.0000 mg | Freq: Two times a day (BID) | INTRAMUSCULAR | Status: DC
  Administered 2014-06-26: 50 mg via INTRAVENOUS
  Filled 2014-06-25 (×2): qty 1

## 2014-06-25 NOTE — Progress Notes (Signed)
[facility] Progress Note   Joshua Norton FVC:[id number] DOB: 1919/01/16 DOA: 06/22/2014 PCP: [name], MD  Brief narrative: Joshua Norton is a 80 y.o. male with dementia, HTN, cardiomyopathy who was admitted to [facility] on 6/4 for hypotension, hydrated and went to rehab facility after being discharged. He returned to the ER and had a foley cath placed on 7/5  for hematuria and passage of blood clots. He was supposed to f/u with his urologist. When he returned to the ER on 7/11 for left shoulder pain, he was found to have a urethral meatus ulcer, leukocytosis, worsening renal function and a UTI. He was started on Rocephin and admitted to the hospital. He became hypotensive on 7/12 and was transferred to the ICU for pressors however, BP improved after fluid boluses. He was switched to Vanc and Zosyn  He was noted on imaging of the head to have lytic lesions in the c spine. Further work up reveals an elevated PSA.    Subjective: Poor memory and unable to give a detailed history.  Feels no pain in his right shoulder as long as he gets his pain medications.   Assessment/Plan: Principal Problem:   Sepsis? - suspected to be due to UTI  - UA revealed large leukocytes and many bacteria-but cultures showing multiple morphotypes - will recheck UA and culture today- recommend 7 days of antibiotic starting on 7/11- will place back on Rocephin at this point  Active Problems: Hypotension - due to sepsis vs dehydration- cont IVF and monitor PO intake of fluids - note he was admitted for hypotension in June as well- likely has poor PO intake in addition to receiving diuretics- this may be secondary to underlying cancer  - wean stress dose steroids from Q6 to Q12  Hyponatremia - chronic issue suspected previously to be due to poor PO intake- previously was on diuretics- previously on Zaroxolyn and Lasix - Zaroxolyn discontinued and Lasix cut in half to 20 mg when discharged on 6/8 -  uable to do urine studies as he has been on IVF which has improved his sodium level - d/c diuretics completely as ECHO reveals a normal EF now-   H/o systolic CHF - previous ECHO in 2013 revealed EF of 45%- repeat ECHO reveals this to have normalized- no further need for diuretics.     Lytic bone lesions on xray/ elevated Alk phos - elevated PSA- will do bone survey but no other work up as discussed with grandson - previously liver/ gallbladder testing in June was unrevealing and it was recommended he have further w/u as outpt  shoulder pain - admitted for right shoulder pain - xray were negative- still has pain when I attempt to passively elevated although no pain at rest- - cont pain control- OT eval  Urethral meatus ulcer and recent hematuria - foley removed- follow for retention-     Dementia   Code Status: Full code Family Communication: spoke with grandson Vickii Chafe who will speak with his aunt and uncle (the patient's children) about code status. He admits that the patient has not been eating well due to loss of appetite and has lost weight. He would not want any further work up of an underlying cancer other than a bone survey and does not want an MRI of the right shoulder.  Disposition Plan: return to SNF  Consultants: Urology  Procedures: none  Antibiotics: Anti-infectives   Start     Dose/Rate Route Frequency Ordered Stop   06/24/14 1600  vancomycin (  VANCOCIN) IVPB 1000 mg/200 mL premix  Status:  Discontinued     1,000 mg 200 mL/hr over 60 Minutes Intravenous Every 24 hours 06/23/14 1432 06/24/14 1058   06/23/14 1500  vancomycin (VANCOCIN) 1,500 mg in sodium chloride 0.9 % 500 mL IVPB     1,500 mg 250 mL/hr over 120 Minutes Intravenous NOW 06/23/14 1432 06/23/14 1800   06/23/14 0200  piperacillin-tazobactam (ZOSYN) IVPB 3.375 g     3.375 g 12.5 mL/hr over 240 Minutes Intravenous Every 8 hours 06/23/14 0147     06/22/14 2245  cefTRIAXone (ROCEPHIN) 1 g in dextrose 5 %  50 mL IVPB     1 g 100 mL/hr over 30 Minutes Intravenous  Once 06/22/14 2241 06/23/14 0046       DVT prophylaxis: Lovenox  Objective: Filed Weights   06/23/14 1520 06/24/14 0600 06/25/14 0600  Weight: 83 kg (182 lb 15.7 oz) 83.3 kg (183 lb 10.3 oz) 84 kg (185 lb 3 oz)    Vitals Filed Vitals:   06/25/14 1200 06/25/14 1223 06/25/14 1421 06/25/14 1524  BP: 133/70     Pulse: 68  76   Temp:  98.2 F (36.8 C)  97.8 F (36.6 C)  TempSrc:  Oral  Oral  Resp: 17  19   Height:      Weight:      SpO2: 99%  100%       Intake/Output Summary (Last 24 hours) at 06/25/14 1526 Last data filed at 06/25/14 1100  Gross per 24 hour  Intake   2373 ml  Output      0 ml  Net   2373 ml     Exam: General: No acute respiratory distress- awake alert comfortable- confused about past events Lungs: Clear to auscultation bilaterally without wheezes or crackles Cardiovascular: Regular rate and rhythm without murmur gallop or rub normal S1 and S2 Abdomen: Nontender, nondistended, soft, bowel sounds positive, no rebound, no ascites, no appreciable mass Extremities: No significant cyanosis, clubbing, or edema bilateral lower extremities  Data Reviewed: Basic Metabolic Panel:  Recent Labs Lab 06/22/14 2008 06/23/14 0623 06/24/14 0309  NA 125* 127* 132*  K 4.0 4.0 3.6*  CL 86* 88* 98  CO2 22 18* 18*  GLUCOSE 111* 100* 104*  BUN 59* 58* 47*  CREATININE 1.48* 1.38* 1.19  CALCIUM 8.2* 8.5 7.7*   Liver Function Tests:  Recent Labs Lab 06/23/14 1620 06/24/14 0309  AST 20 16  ALT 13 11  ALKPHOS 314* 290*  BILITOT 0.7 0.6  PROT 5.6* 5.4*  ALBUMIN 2.0* 1.9*   No results found for this basename: LIPASE, AMYLASE,  in the last 168 hours No results found for this basename: AMMONIA,  in the last 168 hours CBC:  Recent Labs Lab 06/22/14 2008 06/23/14 0623 06/23/14 1620 06/24/14 0309  WBC 13.3* 13.2* 29.8* 26.6*  NEUTROABS 10.6*  --  27.1*  --   HGB 11.5* 11.7* 10.2* 9.5*  HCT  32.0* 33.4* 28.8* 26.7*  MCV 80.0 81.3 82.5 81.7  PLT 377 333 347 322   Cardiac Enzymes:  Recent Labs Lab 06/22/14 2008 06/24/14 0309  TROPONINI <0.30 <0.30   BNP (last 3 results) No results found for this basename: PROBNP,  in the last 8760 hours CBG: No results found for this basename: GLUCAP,  in the last 168 hours  Recent Results (from the past 240 hour(s))  URINE CULTURE     Status: None   Collection Time    06/22/14 10:52 PM  Result Value Ref Range Status   Specimen Description URINE, RANDOM   Final   Special Requests NONE   Final   Culture  Setup Time     Final   Value: 06/23/2014 16:55     Performed at [place]     Final   Value: >=100,000 COLONIES/ML     Performed at [facility]   Culture     Final   Value: Multiple bacterial morphotypes present, none predominant. Suggest appropriate recollection if clinically indicated.     Performed at [facility]   Report Status 06/25/2014 FINAL   Final  MRSA PCR SCREENING     Status: None   Collection Time    06/23/14  3:01 PM      Result Value Ref Range Status   MRSA by PCR NEGATIVE  NEGATIVE Final   Comment:            The GeneXpert MRSA Assay (FDA     approved for NASAL specimens     only), is one component of a     comprehensive MRSA colonization     surveillance program. It is not     intended to diagnose MRSA     infection nor to guide or     monitor treatment for     MRSA infections.     Studies:  Recent x-ray studies have been reviewed in detail by the Attending Physician  Scheduled Meds:  Scheduled Meds: . antiseptic oral rinse  15 mL Mouth Rinse BID  . aspirin EC  325 mg Oral QHS  . brinzolamide  1 drop Both Eyes BID  . enoxaparin (LOVENOX) injection  40 mg Subcutaneous Daily  . finasteride  5 mg Oral QHS  . FLUoxetine  20 mg Oral Daily  . hydrocortisone sod succinate (SOLU-CORTEF) inj  50 mg Intravenous Q6H  . latanoprost  1 drop Both Eyes QHS  .  loratadine  10 mg Oral Daily  . pantoprazole  20 mg Oral QHS  . piperacillin-tazobactam (ZOSYN)  IV  3.375 g Intravenous Q8H  . silver sulfADIAZINE   Topical BID  . sodium chloride  3 mL Intravenous Q12H   Continuous Infusions: . sodium chloride 100 mL/hr at 06/25/14 0700    Time spent on care of this patient: 45 min   [place], MD 06/25/2014, 3:26 PM  LOS: 3 days   [facility] Office  [phone] Pager - Text Page per Shea Evans   If 7PM-7AM, please contact night-coverage [email or web address]

## 2014-06-25 NOTE — Care Management Note (Signed)
Patient is active with [facility] for HHRN/PT/OT/HHA.  Resumption of care orders requested upon discharge to home.

## 2014-06-25 NOTE — Progress Notes (Signed)
Clinical Social Work Department BRIEF PSYCHOSOCIAL ASSESSMENT 06/25/2014  Patient:  Joshua Norton, Joshua Norton     Account Number:  [id number]     Admit date:  06/22/2014  Clinical Social Worker:  Megan Salon  Date/Time:  06/25/2014 01:32 PM  Referred by:  Care Management  Date Referred:  06/25/2014 Referred for  SNF Placement   Other Referral:   Interview type:  Other - See comment Other interview type:   CSW spoke to patient's granddaughter over the phone    PSYCHOSOCIAL DATA Living Status:  FAMILY Admitted from facility:   Level of care:   Primary support name:  Joshua Norton Primary support relationship to patient:  FAMILY Degree of support available:   Good    CURRENT CONCERNS Current Concerns  Post-Acute Placement   Other Concerns:    SOCIAL WORK ASSESSMENT / PLAN CSW received consult for SNF placement at discharge. CSW reviewed chart and called patient's contact numbers and spoke with Joshua Norton, patient's granddaughter. CSW explained role and reason for phone call. Patient's granddaughter states that patient was recently at [facility] for rehab and then they took him back home to live with family. Patient's granddaughter states that she is very upset with the care at [facility] and does not want patient going back to a SNF. Granddaughter states that family is going to provide 24 hour care and would like Home Health RN/PT and a hospital bed. CSW notified Case Manager who will follow up with family and patient. CSW signing off at this time. Please re consult if social work needs arise.   Assessment/plan status:  Psychosocial Support/Ongoing Assessment of Needs Other assessment/ plan:   Information/referral to community resources:   CSW contact information    PATIENT'S/FAMILY'S RESPONSE TO PLAN OF CARE: Patient's granddaughter states that        Joshua Norton, MSW, [place]

## 2014-06-25 NOTE — Care Management Note (Signed)
Page 1 of 2   06/28/2014     8:24:12 AM CARE MANAGEMENT NOTE 06/28/2014  Patient:  [facility]   Account Number:  [id number]  Date Initiated:  06/25/2014  Documentation initiated by:  Luz Lex  Subjective/Objective Assessment:   Admitted with chronic R shoulder pain. found to by hypotensive with UTI     Action/Plan:   Anticipated DC Date:  06/27/2014   Anticipated DC Plan:  [place]  CM consult      [facility] Choice  DURABLE MEDICAL EQUIPMENT   Choice offered to / List presented to:  C-5 Sibling   DME arranged  HOSPITAL BED      DME agency  [place] arranged  HH-1 RN  [place]      Circle agency  [place]   Status of service:  Completed, signed off Medicare Important Message given?  YES (If response is "NO", the following Medicare IM given date fields will be blank) Date Medicare IM given:  06/26/2014 Medicare IM given by:  Tomi Bamberger Date Additional Medicare IM given:   Additional Medicare IM given by:    Discharge Disposition:  [place]  Per UR Regulation:  Reviewed for med. necessity/level of care/duration of stay  If discussed at [place] of Stay Meetings, dates discussed:    Comments:  ContactAlwyn, [place] [phone]                 [place], [place] Grandaughter [phone]  06/28/14 [place], BSN [phone] NCM called granddaughter at 68 on 7/16 and did not get an answer, NCM left message with Charge RN to check with granddaughter to make sure hospital bed is there before he is dc.   NCM spoke with RN on floor and she states the bed is there but ptar wanted to know who was going to pay the bill for the ambulance ,  NCM spoke with CSW  who stated they should bill it to insurance, NCM  instructed RN to inform ptar to  bill it to patient's insurance, and this is not a question they usually ask when  picking up a patient to transport.  Patient did not get transported until late last pm.  06/27/14 1039 Tomi Bamberger RN, BSN [phone] MD informed NCM that hospital bed will be delivered tomorrow per granddaughter.  NCM called [employer] to see why bed could not be delivered today, [facility] stated the granddaughter was suppose to call them back with the co pay of $15.00 and the delivery time for today, but they have not heard from her.  NCM called grand daughter and she states she does not have a credit card to make the co pay of $15 , she will have [place]  , patient's grandson to do this, but she will need to contact him at work.  She states she will call me back when she gets this done.  NCM notified Alvis Lemmings that patient is for possible dc today.  06/26/14 [place], BSN [phone]  NCM spoke with [name] , she states she lives with patient and he will have 24 hr care, previous note , patient is set up with [facility] also.  [name] states she would like to get hospital bed with [facility], information faxed to Jeneen Rinks with [facility] with [name] contact information for  delivery.  06-25-14 2:20pm Luz Lex, RNBSN [phone] Talked with grandaughter, [name] - plans to take patient home - has BSC and WC.  would like a bed.  Will need order for.  Also would like [place] RN and NA and I feel a SW would be good for hookup with the community.  Would like [facility]. Referral made to [facility] to make sure had services available. Confirmed RN, PT and SW  and NT once RN assessment done.  Initial IM given by admisstin on 7-13.

## 2014-06-26 ENCOUNTER — Ambulatory Visit: Payer: Self-pay | Admitting: Internal Medicine

## 2014-06-26 DIAGNOSIS — I10 Essential (primary) hypertension: Secondary | ICD-10-CM

## 2014-06-26 LAB — CBC
HCT: 28.7 % — ABNORMAL LOW (ref 39.0–52.0)
Hemoglobin: 9.9 g/dL — ABNORMAL LOW (ref 13.0–17.0)
MCH: 29 pg (ref 26.0–34.0)
MCHC: 34.5 g/dL (ref 30.0–36.0)
MCV: 84.2 fL (ref 78.0–100.0)
Platelets: 358 10*3/uL (ref 150–400)
RBC: 3.41 MIL/uL — AB (ref 4.22–5.81)
RDW: 15.7 % — ABNORMAL HIGH (ref 11.5–15.5)
WBC: 14 10*3/uL — AB (ref 4.0–10.5)

## 2014-06-26 LAB — BASIC METABOLIC PANEL
ANION GAP: 14 (ref 5–15)
BUN: 32 mg/dL — ABNORMAL HIGH (ref 6–23)
CO2: 23 mEq/L (ref 19–32)
Calcium: 8.1 mg/dL — ABNORMAL LOW (ref 8.4–10.5)
Chloride: 102 mEq/L (ref 96–112)
Creatinine, Ser: 0.99 mg/dL (ref 0.50–1.35)
GFR calc Af Amer: 80 mL/min — ABNORMAL LOW (ref >90)
GFR calc non Af Amer: 69 mL/min — ABNORMAL LOW (ref >90)
GLUCOSE: 109 mg/dL — AB (ref 70–99)
POTASSIUM: 3.4 meq/L — AB (ref 3.7–5.3)
SODIUM: 139 meq/L (ref 137–147)

## 2014-06-26 MED ORDER — HYDROCORTISONE NA SUCCINATE PF 100 MG IJ SOLR
25.0000 mg | Freq: Two times a day (BID) | INTRAMUSCULAR | Status: DC
  Administered 2014-06-26: 25 mg via INTRAVENOUS
  Filled 2014-06-26 (×3): qty 0.5

## 2014-06-26 NOTE — Progress Notes (Signed)
Foley draining moderate amount of blood. MD notified.

## 2014-06-26 NOTE — Progress Notes (Signed)
Physical Therapy Treatment Patient Details Name: Joshua Norton MRN: [id number] DOB: [birth year] Today's Date: 06/26/2014    History of Present Illness 80 yo male admitted from SNF due to Lt heel wound stage III , Rt shoulder pain x2 falls , BIL Le edema, necrotic tissue on tip of penis and sepsis due to UTI.     PT Comments    Patient continues to exhibit weakness and inability to stand fully upright even with Max A of 2 people. Cognitive deficits/dementia may be limiting progress. Increased time to perform all tasks. Pt resisting therapist at times during transfers. Will continue to follow as appropriate.   Follow Up Recommendations  SNF;Supervision/Assistance - 24 hour     Equipment Recommendations  None recommended by PT    Recommendations for Other Services       Precautions / Restrictions Precautions Precautions: Fall Precaution Comments: stage III wound left heel Restrictions Weight Bearing Restrictions: No    Mobility  Bed Mobility Overal bed mobility: Needs Assistance Bed Mobility: Supine to Sit     Supine to sit: Max assist;HOB elevated     General bed mobility comments: VC for technique, increased time for all mobility. Pain with any RUE use. Assist with BLEs and sitting trunk upright. 1 step cues with repetition required.  Transfers Overall transfer level: Needs assistance Equipment used: Rolling walker (2 wheeled) Transfers: Sit to/from [employer] Sit to Stand: Total assist;+2 physical assistance;Max assist;From elevated surface   Squat pivot transfers: Max assist;+2 physical assistance;From elevated surface     General transfer comment: Sit <-> stand x3 from EOB, not able to obtain full standing even with manual cues for hip ext; increased knee flexion bilaterally. Total A of 1 to get pt to clear bottom from bed and hold static squat for toileting hygiene x2. Cues for anterior translation. Squat pivot to chair towards  right.  Ambulation/Gait                 Stairs            Wheelchair Mobility    Modified Rankin (Stroke Patients Only)       Balance Overall balance assessment: Needs assistance Sitting-balance support: Feet supported;Single extremity supported Sitting balance-Leahy Scale: Poor     Standing balance support: During functional activity;Bilateral upper extremity supported Standing balance-Leahy Scale: Poor Standing balance comment: Use of RW vs therapist for support.                    Cognition Arousal/Alertness: Awake/alert Behavior During Therapy: WFL for tasks assessed/performed Overall Cognitive Status: No family/caregiver present to determine baseline cognitive functioning                      Exercises General Exercises - Lower Extremity Ankle Circles/Pumps: Both;10 reps;Seated Long Arc Quad: Both;10 reps;Seated Hip ABduction/ADduction: Both;10 reps;Seated Hip Flexion/Marching: Both;10 reps;Seated    General Comments        Pertinent Vitals/Pain Pt yelps in pain with any movement of RUE. Not rated on scale. Pt repositioned upon departure.     Home Living                      Prior Function            PT Goals (current goals can now be found in the care plan section) Progress towards PT goals: Progressing toward goals (Slowly)    Frequency  Min 2X/week    PT Plan Current  plan remains appropriate    Co-evaluation             End of Session Equipment Utilized During Treatment: Gait belt Activity Tolerance: Patient limited by fatigue;Patient limited by pain Patient left: in chair;with call bell/phone within reach;with nursing/sitter in room     Time: 1331-1400 PT Time Calculation (min): 29 min  Charges:  $Therapeutic Exercise: 8-22 mins $Therapeutic Activity: 8-22 mins                    G CodesCandy Sledge A 07/20/14, 3:00 PM Candy Sledge, [place], DPT [phone]

## 2014-06-26 NOTE — Progress Notes (Addendum)
PATIENT DETAILS Name: Joshua Norton Age: 80 y.o. Sex: male Date of Birth: [birth year] Admit Date: 06/22/2014 Admitting Physician Merton Border, MD MBT:[email or web address] DAVID, MD  Subjective: Acute urinary retention this am, foley placed. Otherwise no major issues.  Assessment/Plan: Principal Problem: Sepsis - suspected to be due to UTI  - UA revealed large leukocytes and many bacteria-but cultures showing multiple morphotypes  - will recheck UA and culture today- recommend 7 days of antibiotic starting on 7/11- will place back on Rocephin at this point  Active Problems: Hypotension  - due to sepsis vs dehydration, suspect underlying relative adrenal insufficiently -has resolved -decrease IVF,decrese Steroids  Hyponatremia -resolved -likely secondary to diuretic therapy,dehydration -monitor lytes periodically  Acute Urinary Retention -foley re-inserted 7/15-as residual urine close to 1000 cc on bladder ultrasound -spoke with Dr Mcdiarmid-continue for now  Hematuria -suspect likely secondary to foley trauma -spoke with Dr Mariel Sleet flushes-if persists will reconsult Urology -for stop ASA, and prophylactic Lovenox  Urethral meatus ulcer  -foley was present on admission, removed because of meatal ulcer with necrosis-plans were to observe-unfortunately developed acute urinary retention on 7/15-foley re-inserted. Case d/w Dr Mcdiarmid-continue supportive care and foley.  H/o systolic CHF  - previous ECHO in 2013 revealed EF of 45%- repeat ECHO reveals this to have normalized- no further need for diuretics. Clinically remains compensated  Lytic bone lesions on xray/ elevated Alk phos  - elevated PSA- will do bone survey but no other work up as discussed with grandson by Dr Wynelle Cleveland  shoulder pain  - admitted for right shoulder pain - xray were negative- still has pain when I attempt to passively elevated although no pain at rest- - cont pain control- OT  eval  Dementia -stable-no obvious delirium at present  Disposition: Remain inpatient  DVT Prophylaxis:  SCD's  Code Status: Full code  Family Communication None at bedside  Procedures:  None  CONSULTS:  pulmonary/intensive care and urology  Time spent 40 minutes-which includes 50% of the time with face-to-face with patient/ family and coordinating care related to the above assessment and plan.    MEDICATIONS: Scheduled Meds: . antiseptic oral rinse  15 mL Mouth Rinse BID  . aspirin EC  325 mg Oral QHS  . brinzolamide  1 drop Both Eyes BID  . cefTRIAXone (ROCEPHIN)  IV  1 g Intravenous Q24H  . enoxaparin (LOVENOX) injection  40 mg Subcutaneous Daily  . finasteride  5 mg Oral QHS  . FLUoxetine  20 mg Oral Daily  . hydrocortisone sod succinate (SOLU-CORTEF) inj  50 mg Intravenous Q12H  . latanoprost  1 drop Both Eyes QHS  . loratadine  10 mg Oral Daily  . pantoprazole  20 mg Oral QHS  . silver sulfADIAZINE   Topical BID  . sodium chloride  3 mL Intravenous Q12H   Continuous Infusions: . sodium chloride 50 mL/hr (06/26/14 1355)   PRN Meds:.acetaminophen, acetaminophen, HYDROcodone-acetaminophen, ondansetron (ZOFRAN) IV, ondansetron, zolpidem  Antibiotics: Anti-infectives   Start     Dose/Rate Route Frequency Ordered Stop   06/25/14 1800  cefTRIAXone (ROCEPHIN) 1 g in dextrose 5 % 50 mL IVPB     1 g 100 mL/hr over 30 Minutes Intravenous Every 24 hours 06/25/14 1530     06/24/14 1600  vancomycin (VANCOCIN) IVPB 1000 mg/200 mL premix  Status:  Discontinued     1,000 mg 200 mL/hr over 60 Minutes Intravenous Every 24 hours 06/23/14 1432 06/24/14 1058   06/23/14 1500  vancomycin (  VANCOCIN) 1,500 mg in sodium chloride 0.9 % 500 mL IVPB     1,500 mg 250 mL/hr over 120 Minutes Intravenous NOW 06/23/14 1432 06/23/14 1800   06/23/14 0200  piperacillin-tazobactam (ZOSYN) IVPB 3.375 g  Status:  Discontinued     3.375 g 12.5 mL/hr over 240 Minutes Intravenous Every 8  hours 06/23/14 0147 06/25/14 1557   06/22/14 2245  cefTRIAXone (ROCEPHIN) 1 g in dextrose 5 % 50 mL IVPB     1 g 100 mL/hr over 30 Minutes Intravenous  Once 06/22/14 2241 06/23/14 0046       PHYSICAL EXAM: Vital signs in last 24 hours: Filed Vitals:   06/25/14 1914 06/25/14 2106 06/26/14 0503 06/26/14 1425  BP: 133/70 144/73 134/65 154/77  Pulse: 105 61 55 59  Temp: 97.6 F (36.4 C) 97.4 F (36.3 C) 97.9 F (36.6 C) 97.9 F (36.6 C)  TempSrc: Oral Oral Oral Oral  Resp: $Remo'14 16 16 16  'aCsNf$ Height:      Weight:      SpO2: 97% 99% 100% 99%    Weight change:  Filed Weights   06/23/14 1520 06/24/14 0600 06/25/14 0600  Weight: 83 kg (182 lb 15.7 oz) 83.3 kg (183 lb 10.3 oz) 84 kg (185 lb 3 oz)   Body mass index is 27.74 kg/(m^2).   Gen Exam: Awake and somewhat alert with clear speech.   Neck: Supple, No JVD.   Chest: B/L Clear.   CVS: S1 S2 Regular, no murmurs.  Abdomen: soft, BS +, non tender, non distended.  Extremities: no edema, lower extremities warm to touch. Neurologic: Non Focal.   Skin: No Rash.   Wounds: N/A.    Intake/Output from previous day:  Intake/Output Summary (Last 24 hours) at 06/26/14 1656 Last data filed at 06/26/14 1023  Gross per 24 hour  Intake  562.5 ml  Output   1450 ml  Net -887.5 ml     LAB RESULTS: CBC  Recent Labs Lab 06/22/14 2008 06/23/14 0623 06/23/14 1620 06/24/14 0309 06/26/14 0642  WBC 13.3* 13.2* 29.8* 26.6* 14.0*  HGB 11.5* 11.7* 10.2* 9.5* 9.9*  HCT 32.0* 33.4* 28.8* 26.7* 28.7*  PLT 377 333 347 322 358  MCV 80.0 81.3 82.5 81.7 84.2  MCH 28.8 28.5 29.2 29.1 29.0  MCHC 35.9 35.0 35.4 35.6 34.5  RDW 14.6 14.8 14.9 15.1 15.7*  LYMPHSABS 1.3  --  1.2  --   --   MONOABS 1.4*  --  1.5*  --   --   EOSABS 0.1  --  0.0  --   --   BASOSABS 0.0  --  0.0  --   --     Chemistries   Recent Labs Lab 06/22/14 2008 06/23/14 0623 06/24/14 0309 06/26/14 0642  NA 125* 127* 132* 139  K 4.0 4.0 3.6* 3.4*  CL 86* 88* 98 102   CO2 22 18* 18* 23  GLUCOSE 111* 100* 104* 109*  BUN 59* 58* 47* 32*  CREATININE 1.48* 1.38* 1.19 0.99  CALCIUM 8.2* 8.5 7.7* 8.1*    CBG: No results found for this basename: GLUCAP,  in the last 168 hours  GFR Estimated Creatinine Clearance: 50.8 ml/min (by C-G formula based on Cr of 0.99).  Coagulation profile  Recent Labs Lab 06/23/14 1620  INR 1.52*    Cardiac Enzymes  Recent Labs Lab 06/22/14 2008 06/24/14 0309  TROPONINI <0.30 <0.30    No components found with this basename: POCBNP,  No results found for this  basename: DDIMER,  in the last 72 hours No results found for this basename: HGBA1C,  in the last 72 hours No results found for this basename: CHOL, HDL, LDLCALC, TRIG, CHOLHDL, LDLDIRECT,  in the last 72 hours No results found for this basename: TSH, T4TOTAL, FREET3, T3FREE, THYROIDAB,  in the last 72 hours No results found for this basename: VITAMINB12, FOLATE, FERRITIN, TIBC, IRON, RETICCTPCT,  in the last 72 hours No results found for this basename: LIPASE, AMYLASE,  in the last 72 hours  Urine Studies No results found for this basename: UACOL, UAPR, USPG, UPH, UTP, UGL, UKET, UBIL, UHGB, UNIT, UROB, ULEU, UEPI, UWBC, URBC, UBAC, CAST, CRYS, UCOM, BILUA,  in the last 72 hours  MICROBIOLOGY: Recent Results (from the past 240 hour(s))  URINE CULTURE     Status: None   Collection Time    06/22/14 10:52 PM      Result Value Ref Range Status   Specimen Description URINE, RANDOM   Final   Special Requests NONE   Final   Culture  Setup Time     Final   Value: 06/23/2014 16:55     Performed at [facility]     Final   Value: >=100,000 COLONIES/ML     Performed at [facility]   Culture     Final   Value: Multiple bacterial morphotypes present, none predominant. Suggest appropriate recollection if clinically indicated.     Performed at [facility]   Report Status 06/25/2014 FINAL   Final  MRSA PCR SCREENING      Status: None   Collection Time    06/23/14  3:01 PM      Result Value Ref Range Status   MRSA by PCR NEGATIVE  NEGATIVE Final   Comment:            The GeneXpert MRSA Assay (FDA     approved for NASAL specimens     only), is one component of a     comprehensive MRSA colonization     surveillance program. It is not     intended to diagnose MRSA     infection nor to guide or     monitor treatment for     MRSA infections.    RADIOLOGY STUDIES/RESULTS: Dg Chest 2 View  06/22/2014   CLINICAL DATA:  Shoulder pain.  Cardiomyopathy.  EXAM: CHEST  2 VIEW  COMPARISON:  05/16/2014  FINDINGS: Stable cardiomegaly and mediastinal contours. Lateral view limited by the patient's arm thickening rays. Pulmonary vascularity is normal. The lungs are normally expanded and clear. No airspace disease or pleural effusion. Negative for pneumothorax. 3 mm radiopaque foreign body projects over the soft tissues of the left neck on the frontal view. No acute osseous abnormality identified. Please note that the majority of the right shoulder is excluded from this image.  IMPRESSION: Stable mild cardiomegaly.  No acute findings in the chest.   Electronically Signed   By: Curlene Dolphin M.D.   On: 06/22/2014 22:01   Dg Shoulder Right  06/22/2014   CLINICAL DATA:  Right shoulder pain and limited range of motion after a fall.  EXAM: RIGHT SHOULDER - 2+ VIEW  COMPARISON:  03/23/2014  FINDINGS: Degenerative changes in the glenohumeral and acromioclavicular joints. Decreased subacromial space consistent with chronic rotator cuff arthropathy. No evidence of acute fracture or dislocation of the right shoulder.  IMPRESSION: Degenerative changes in the right shoulder with probable chronic rotator cuff arthropathy.  No acute bony abnormalities identified 3   Electronically Signed   By: [name] M.D.   On: 06/22/2014 22:02   Ct Head Wo Contrast  06/22/2014   CLINICAL DATA:  Neck pain after a fall.  EXAM: CT HEAD WITHOUT  CONTRAST  CT CERVICAL SPINE WITHOUT CONTRAST  TECHNIQUE: Multidetector CT imaging of the head and cervical spine was performed following the standard protocol without intravenous contrast. Multiplanar CT image reconstructions of the cervical spine were also generated.  COMPARISON:  None.  FINDINGS: CT HEAD FINDINGS  Prominent diffuse cerebral atrophy. Ventricular dilatation is likely due to central atrophy. Low-attenuation changes in the deep white matter consistent with small vessel ischemia. No mass effect or midline shift. No abnormal extra-axial fluid collections. Gray-white matter junctions are distinct. Basal cisterns are not effaced. No evidence of acute intracranial hemorrhage. No depressed skull fractures. Visualized paranasal sinuses and mastoid air cells are not opacified.  CT CERVICAL SPINE FINDINGS  Straightening of the usual cervical lordosis which may be due to patient positioning but ligamentous injury or muscle spasm can also have this appearance. There is diffuse degenerative change throughout the cervical spine with diffuse narrowing of cervical interspaces and associated endplate hypertrophic changes. Prominent disc osteophyte complexes at C3-4, C4-5, C5-6, and C6-7 levels. C1-2 articulation appears intact. There is diffuse heterogeneous sclerosis and lucency demonstrated throughout the cervical spine and in the visualized upper thoracic vertebra. This is likely to indicate diffuse bone metastasis. Renal osteodystrophy, metabolic abnormalities, or severe osteoporosis could also potentially have this appearance. Correlation with PSA is recommended.  IMPRESSION: No acute intracranial abnormalities. Chronic atrophy and small vessel ischemic changes.  Nonspecific straightening of the usual cervical lordosis. Diffuse degenerative change throughout the cervical spine. Heterogeneous lytic and sclerotic changes throughout the visualized spine worrisome for bone metastasis.   Electronically Signed   By:  [name] M.D.   On: 06/22/2014 22:19   Ct Cervical Spine Wo Contrast  06/22/2014   CLINICAL DATA:  Neck pain after a fall.  EXAM: CT HEAD WITHOUT CONTRAST  CT CERVICAL SPINE WITHOUT CONTRAST  TECHNIQUE: Multidetector CT imaging of the head and cervical spine was performed following the standard protocol without intravenous contrast. Multiplanar CT image reconstructions of the cervical spine were also generated.  COMPARISON:  None.  FINDINGS: CT HEAD FINDINGS  Prominent diffuse cerebral atrophy. Ventricular dilatation is likely due to central atrophy. Low-attenuation changes in the deep white matter consistent with small vessel ischemia. No mass effect or midline shift. No abnormal extra-axial fluid collections. Gray-white matter junctions are distinct. Basal cisterns are not effaced. No evidence of acute intracranial hemorrhage. No depressed skull fractures. Visualized paranasal sinuses and mastoid air cells are not opacified.  CT CERVICAL SPINE FINDINGS  Straightening of the usual cervical lordosis which may be due to patient positioning but ligamentous injury or muscle spasm can also have this appearance. There is diffuse degenerative change throughout the cervical spine with diffuse narrowing of cervical interspaces and associated endplate hypertrophic changes. Prominent disc osteophyte complexes at C3-4, C4-5, C5-6, and C6-7 levels. C1-2 articulation appears intact. There is diffuse heterogeneous sclerosis and lucency demonstrated throughout the cervical spine and in the visualized upper thoracic vertebra. This is likely to indicate diffuse bone metastasis. Renal osteodystrophy, metabolic abnormalities, or severe osteoporosis could also potentially have this appearance. Correlation with PSA is recommended.  IMPRESSION: No acute intracranial abnormalities. Chronic atrophy and small vessel ischemic changes.  Nonspecific straightening of the usual cervical lordosis. Diffuse degenerative change throughout  the cervical spine. Heterogeneous lytic and sclerotic changes throughout the visualized spine worrisome for bone metastasis.   Electronically Signed   By: [name] M.D.   On: 06/22/2014 22:19   Dg Chest Port 1 View  06/24/2014   CLINICAL DATA:  Cardiomyopathy  EXAM: PORTABLE CHEST - 1 VIEW  COMPARISON:  06/22/2014  FINDINGS: Cardiac shadow is again mildly enlarged. The lungs are well aerated bilaterally. No focal confluent infiltrate is seen.  IMPRESSION: Given some technical variations in the image, no significant interval change is noted.   Electronically Signed   By: Inez Catalina M.D.   On: 06/24/2014 07:50   Dg Bone Survey Met  06/25/2014   CLINICAL DATA:  Lytic lesions in cervical spine  EXAM: METASTATIC BONE SURVEY  COMPARISON:  CT cervical spine 06/22/2014  FINDINGS: Osseous demineralization.  Prominent RIGHT hilum.  Lytic lesions are identified in the C4 and C5 vertebral bodies, better visualized on prior CT.  Inferior cervical spine is obscured by the patient shoulders, better visualized on CT.  No definite calvarial lucencies.  Multilevel degenerative disc and facet disease changes cervical spine.  Patchy areas attenuation identified to thoracic and lumbar vertebrae, suspect areas of sclerosis and lucency at multiple levels, most prominent being sclerosis on LEFT at T7, L1, and L5.  Thoracic and lumbar vertebral body heights appear maintained.  Sclerosis at medial aspect of posterior LEFT tenth rib.  Patchy areas of sclerosis in pelvis.  LEFT knee prosthesis noted.  No focal long bone sclerosis or lytic lesions identified.  IMPRESSION: Lytic foci within the C4 and C5 vertebral bodies, could be seen with multiple myeloma or lytic metastases.  Numerous sclerotic foci identified within thoracic and lumbar vertebrae, posterior LEFT tenth rib, and throughout pelvis highly suspicious for sclerotic metastases such as from prostate cancer; correlation with digital rectal exam and PSA recommended.  It  is difficult to it exclude additional lytic foci within the thoracolumbar spine versus areas of normal mineralization adjacent to sclerotic lesions.  Osseous demineralization without definite long bone abnormalities.   Electronically Signed   By: Lavonia Dana M.D.   On: 06/25/2014 18:55   Dg Humerus Right  06/22/2014   CLINICAL DATA:  Right shoulder pain and limited range of motion after a fall.  EXAM: RIGHT HUMERUS - 2+ VIEW  COMPARISON:  Right shoulder 03/23/2014  FINDINGS: Degenerative changes in the right shoulder. No evidence of acute fracture or dislocation of the humerus. Soft tissues are unremarkable.  IMPRESSION: No acute bony abnormalities.   Electronically Signed   By: [name] M.D.   On: 06/22/2014 22:11    Oren Binet, MD  [facility] Pager:[phone]  If 7PM-7AM, please contact night-coverage [email or web address] Password [email or web address] 06/26/2014, 4:56 PM   LOS: 4 days   **Disclaimer: This note may have been dictated with voice recognition software. Similar sounding words can inadvertently be transcribed and this note may contain transcription errors which may not have been corrected upon publication of note.**

## 2014-06-27 ENCOUNTER — Ambulatory Visit: Payer: Medicare HMO | Admitting: Cardiology

## 2014-06-27 ENCOUNTER — Ambulatory Visit: Payer: Self-pay | Admitting: Internal Medicine

## 2014-06-27 DIAGNOSIS — F0390 Unspecified dementia without behavioral disturbance: Secondary | ICD-10-CM

## 2014-06-27 MED ORDER — AMLODIPINE BESYLATE 2.5 MG PO TABS: 2.5000 mg | ORAL_TABLET | Freq: Every day | ORAL | Status: DC

## 2014-06-27 MED ORDER — SILVER SULFADIAZINE 1 % EX CREA: TOPICAL_CREAM | Freq: Two times a day (BID) | CUTANEOUS | Status: AC

## 2014-06-27 MED ORDER — CIPROFLOXACIN HCL 500 MG PO TABS: 500.0000 mg | ORAL_TABLET | Freq: Two times a day (BID) | ORAL | Status: DC

## 2014-06-27 MED ORDER — HYDROCORTISONE NA SUCCINATE PF 100 MG IJ SOLR
25.0000 mg | Freq: Every day | INTRAMUSCULAR | Status: DC
  Administered 2014-06-27: 25 mg via INTRAVENOUS
  Filled 2014-06-27: qty 0.5

## 2014-06-27 MED ORDER — PREDNISONE 10 MG PO TABS: ORAL_TABLET | ORAL | Status: DC

## 2014-06-27 MED ORDER — POTASSIUM CHLORIDE CRYS ER 20 MEQ PO TBCR
40.0000 meq | EXTENDED_RELEASE_TABLET | Freq: Once | ORAL | Status: AC
  Administered 2014-06-27: 40 meq via ORAL
  Filled 2014-06-27: qty 2

## 2014-06-27 MED ORDER — HYDROCODONE-ACETAMINOPHEN 5-325 MG PO TABS: 1.0000 | ORAL_TABLET | Freq: Four times a day (QID) | ORAL | Status: DC | PRN

## 2014-06-27 NOTE — Discharge Summary (Signed)
PATIENT DETAILS Name: Joshua Norton Age: 80 y.o. Sex: male Date of Birth: [birth year] MRN: [id number]. Admit Date: 06/22/2014 Admitting Physician: Merton Border, MD [id number] DAVID, MD  Recommendations for Outpatient Follow-up:  1. Please refer patient to Hospice services-family does not want any further malignancy work up including bx (see below) 2. Please refer patient to Urology-family aware that they should make a call and get an appointment 3. Voiding trial and consideration for supra-pubic catheter upon Urology follow up  PRIMARY DISCHARGE DIAGNOSIS:  Principal Problem:   Sepsis Active Problems:   UTI (lower urinary tract infection)   Lytic bone lesions on xray   Dementia      PAST MEDICAL HISTORY: Past Medical History  Diagnosis Date  . Edema of both legs 06/09/2012    2D Echo - EF 40-45, left ventricle cavity moderately dilated  . HTN (hypertension), benign     On multiple medications  . Osteoarthritis (arthritis due to wear and tear of joints)     S/p L Knee Arthroplasty  . Diastolic dysfunction     Chronic  . Cardiomyopathy     EF 40-45% 2D 6/13  . Prediabetes   . Hyperlipidemia   . Anemia   . Vitamin D deficiency   . Glaucoma     DISCHARGE MEDICATIONS:   Medication List    STOP taking these medications       furosemide 40 MG tablet  Commonly known as:  LASIX     quinapril 20 MG tablet  Commonly known as:  ACCUPRIL      TAKE these medications       amLODipine 2.5 MG tablet  Commonly known as:  NORVASC  Take 1 tablet (2.5 mg total) by mouth daily.     aspirin EC 325 MG tablet  Take 325 mg by mouth at bedtime.     bimatoprost 0.01 % Soln  Commonly known as:  LUMIGAN  Place 1 drop into both eyes at bedtime.     brinzolamide 1 % ophthalmic suspension  Commonly known as:  AZOPT  Place 1 drop into both eyes 2 (two) times daily.     ciprofloxacin 500 MG tablet  Commonly known as:  CIPRO  Take 1 tablet (500 mg total) by mouth 2 (two)  times daily.     fexofenadine 180 MG tablet  Commonly known as:  ALLEGRA  Take 180 mg by mouth at bedtime.     finasteride 5 MG tablet  Commonly known as:  PROSCAR  Take 5 mg by mouth at bedtime.     FLUoxetine 20 MG capsule  Commonly known as:  PROZAC  Take 20 mg by mouth daily.     GLUCOSAMINE-CHONDROITIN-MSM PO  Take 1 tablet by mouth at bedtime. Glucosamine 1500 mg/ chrondroiton 1200 mg     HYDROcodone-acetaminophen 5-325 MG per tablet  Commonly known as:  NORCO/VICODIN  Take 1 tablet by mouth every 6 (six) hours as needed for moderate pain.     multivitamin with minerals Tabs tablet  Take 1 tablet by mouth at bedtime.     pantoprazole 20 MG tablet  Commonly known as:  PROTONIX  Take 20 mg by mouth at bedtime.     predniSONE 10 MG tablet  Commonly known as:  DELTASONE  - Take 30 mg (3 tablets) daily for 1 day,then,  - Take 20 mg (2 tablets) daily for 1 day,then,  - Take 10 mg (1 tablets) daily for 1 day,then stop     silver sulfADIAZINE  1 % cream  Commonly known as:  SILVADENE  Apply topically 2 (two) times daily. Apply to penis twice daily     vitamin B-12 1000 MCG tablet  Commonly known as:  CYANOCOBALAMIN  Take 1,000 mcg by mouth at bedtime.     Vitamin D 2000 UNITS tablet  Take 2,000 Units by mouth at bedtime.        ALLERGIES:   Allergies  Allergen Reactions  . Doxazosin Swelling    BRIEF HPI:  See H&P, Labs, Consult and Test reports for all details in brief, is a 80 y.o. male with dementia, HTN, cardiomyopathy who was admitted to [facility] on 6/4 for hypotension, hydrated and went to rehab facility after being discharged. He returned to the ER and had a foley cath placed on 7/5 for hematuria and passage of blood clots. He was supposed to f/u with his urologist. When he returned to the ER on 7/11 for left shoulder pain, he was found to have a urethral meatus ulcer, leukocytosis, worsening renal function and a UTI.  CONSULTATIONS:    pulmonary/intensive care and urology  PERTINENT RADIOLOGIC STUDIES: Dg Chest 2 View  06/22/2014   CLINICAL DATA:  Shoulder pain.  Cardiomyopathy.  EXAM: CHEST  2 VIEW  COMPARISON:  05/16/2014  FINDINGS: Stable cardiomegaly and mediastinal contours. Lateral view limited by the patient's arm thickening rays. Pulmonary vascularity is normal. The lungs are normally expanded and clear. No airspace disease or pleural effusion. Negative for pneumothorax. 3 mm radiopaque foreign body projects over the soft tissues of the left neck on the frontal view. No acute osseous abnormality identified. Please note that the majority of the right shoulder is excluded from this image.  IMPRESSION: Stable mild cardiomegaly.  No acute findings in the chest.   Electronically Signed   By: Curlene Dolphin M.D.   On: 06/22/2014 22:01   Dg Shoulder Right  06/22/2014   CLINICAL DATA:  Right shoulder pain and limited range of motion after a fall.  EXAM: RIGHT SHOULDER - 2+ VIEW  COMPARISON:  03/23/2014  FINDINGS: Degenerative changes in the glenohumeral and acromioclavicular joints. Decreased subacromial space consistent with chronic rotator cuff arthropathy. No evidence of acute fracture or dislocation of the right shoulder.  IMPRESSION: Degenerative changes in the right shoulder with probable chronic rotator cuff arthropathy. No acute bony abnormalities identified 3   Electronically Signed   By: [name] M.D.   On: 06/22/2014 22:02   Ct Head Wo Contrast  06/22/2014   CLINICAL DATA:  Neck pain after a fall.  EXAM: CT HEAD WITHOUT CONTRAST  CT CERVICAL SPINE WITHOUT CONTRAST  TECHNIQUE: Multidetector CT imaging of the head and cervical spine was performed following the standard protocol without intravenous contrast. Multiplanar CT image reconstructions of the cervical spine were also generated.  COMPARISON:  None.  FINDINGS: CT HEAD FINDINGS  Prominent diffuse cerebral atrophy. Ventricular dilatation is likely due to central  atrophy. Low-attenuation changes in the deep white matter consistent with small vessel ischemia. No mass effect or midline shift. No abnormal extra-axial fluid collections. Gray-white matter junctions are distinct. Basal cisterns are not effaced. No evidence of acute intracranial hemorrhage. No depressed skull fractures. Visualized paranasal sinuses and mastoid air cells are not opacified.  CT CERVICAL SPINE FINDINGS  Straightening of the usual cervical lordosis which may be due to patient positioning but ligamentous injury or muscle spasm can also have this appearance. There is diffuse degenerative change throughout the cervical spine with diffuse narrowing of  cervical interspaces and associated endplate hypertrophic changes. Prominent disc osteophyte complexes at C3-4, C4-5, C5-6, and C6-7 levels. C1-2 articulation appears intact. There is diffuse heterogeneous sclerosis and lucency demonstrated throughout the cervical spine and in the visualized upper thoracic vertebra. This is likely to indicate diffuse bone metastasis. Renal osteodystrophy, metabolic abnormalities, or severe osteoporosis could also potentially have this appearance. Correlation with PSA is recommended.  IMPRESSION: No acute intracranial abnormalities. Chronic atrophy and small vessel ischemic changes.  Nonspecific straightening of the usual cervical lordosis. Diffuse degenerative change throughout the cervical spine. Heterogeneous lytic and sclerotic changes throughout the visualized spine worrisome for bone metastasis.   Electronically Signed   By: Burman Nieves M.D.   On: 06/22/2014 22:19   Ct Cervical Spine Wo Contrast  06/22/2014   CLINICAL DATA:  Neck pain after a fall.  EXAM: CT HEAD WITHOUT CONTRAST  CT CERVICAL SPINE WITHOUT CONTRAST  TECHNIQUE: Multidetector CT imaging of the head and cervical spine was performed following the standard protocol without intravenous contrast. Multiplanar CT image reconstructions of the cervical  spine were also generated.  COMPARISON:  None.  FINDINGS: CT HEAD FINDINGS  Prominent diffuse cerebral atrophy. Ventricular dilatation is likely due to central atrophy. Low-attenuation changes in the deep white matter consistent with small vessel ischemia. No mass effect or midline shift. No abnormal extra-axial fluid collections. Gray-white matter junctions are distinct. Basal cisterns are not effaced. No evidence of acute intracranial hemorrhage. No depressed skull fractures. Visualized paranasal sinuses and mastoid air cells are not opacified.  CT CERVICAL SPINE FINDINGS  Straightening of the usual cervical lordosis which may be due to patient positioning but ligamentous injury or muscle spasm can also have this appearance. There is diffuse degenerative change throughout the cervical spine with diffuse narrowing of cervical interspaces and associated endplate hypertrophic changes. Prominent disc osteophyte complexes at C3-4, C4-5, C5-6, and C6-7 levels. C1-2 articulation appears intact. There is diffuse heterogeneous sclerosis and lucency demonstrated throughout the cervical spine and in the visualized upper thoracic vertebra. This is likely to indicate diffuse bone metastasis. Renal osteodystrophy, metabolic abnormalities, or severe osteoporosis could also potentially have this appearance. Correlation with PSA is recommended.  IMPRESSION: No acute intracranial abnormalities. Chronic atrophy and small vessel ischemic changes.  Nonspecific straightening of the usual cervical lordosis. Diffuse degenerative change throughout the cervical spine. Heterogeneous lytic and sclerotic changes throughout the visualized spine worrisome for bone metastasis.   Electronically Signed   By: Burman Nieves M.D.   On: 06/22/2014 22:19   Dg Chest Port 1 View  06/24/2014   CLINICAL DATA:  Cardiomyopathy  EXAM: PORTABLE CHEST - 1 VIEW  COMPARISON:  06/22/2014  FINDINGS: Cardiac shadow is again mildly enlarged. The lungs are well  aerated bilaterally. No focal confluent infiltrate is seen.  IMPRESSION: Given some technical variations in the image, no significant interval change is noted.   Electronically Signed   By: Alcide Clever M.D.   On: 06/24/2014 07:50   Dg Bone Survey Met  06/25/2014   CLINICAL DATA:  Lytic lesions in cervical spine  EXAM: METASTATIC BONE SURVEY  COMPARISON:  CT cervical spine 06/22/2014  FINDINGS: Osseous demineralization.  Prominent RIGHT hilum.  Lytic lesions are identified in the C4 and C5 vertebral bodies, better visualized on prior CT.  Inferior cervical spine is obscured by the patient shoulders, better visualized on CT.  No definite calvarial lucencies.  Multilevel degenerative disc and facet disease changes cervical spine.  Patchy areas attenuation identified to thoracic and lumbar  vertebrae, suspect areas of sclerosis and lucency at multiple levels, most prominent being sclerosis on LEFT at T7, L1, and L5.  Thoracic and lumbar vertebral body heights appear maintained.  Sclerosis at medial aspect of posterior LEFT tenth rib.  Patchy areas of sclerosis in pelvis.  LEFT knee prosthesis noted.  No focal long bone sclerosis or lytic lesions identified.  IMPRESSION: Lytic foci within the C4 and C5 vertebral bodies, could be seen with multiple myeloma or lytic metastases.  Numerous sclerotic foci identified within thoracic and lumbar vertebrae, posterior LEFT tenth rib, and throughout pelvis highly suspicious for sclerotic metastases such as from prostate cancer; correlation with digital rectal exam and PSA recommended.  It is difficult to it exclude additional lytic foci within the thoracolumbar spine versus areas of normal mineralization adjacent to sclerotic lesions.  Osseous demineralization without definite long bone abnormalities.   Electronically Signed   By: Lavonia Dana M.D.   On: 06/25/2014 18:55   Dg Humerus Right  06/22/2014   CLINICAL DATA:  Right shoulder pain and limited range of motion after a  fall.  EXAM: RIGHT HUMERUS - 2+ VIEW  COMPARISON:  Right shoulder 03/23/2014  FINDINGS: Degenerative changes in the right shoulder. No evidence of acute fracture or dislocation of the humerus. Soft tissues are unremarkable.  IMPRESSION: No acute bony abnormalities.   Electronically Signed   By: [name] M.D.   On: 06/22/2014 22:11     PERTINENT LAB RESULTS: CBC:  Recent Labs  06/26/14 0642  WBC 14.0*  HGB 9.9*  HCT 28.7*  PLT 358   CMET CMP     Component Value Date/Time   NA 139 06/26/2014 0642   K 3.4* 06/26/2014 0642   CL 102 06/26/2014 0642   CO2 23 06/26/2014 0642   GLUCOSE 109* 06/26/2014 0642   BUN 32* 06/26/2014 0642   CREATININE 0.99 06/26/2014 0642   CREATININE 1.35 05/03/2014 1150   CALCIUM 8.1* 06/26/2014 0642   PROT 5.4* 06/24/2014 0309   ALBUMIN 1.9* 06/24/2014 0309   AST 16 06/24/2014 0309   ALT 11 06/24/2014 0309   ALKPHOS 290* 06/24/2014 0309   BILITOT 0.6 06/24/2014 0309   GFRNONAA 69* 06/26/2014 0642   GFRNONAA 45* 05/03/2014 1150   GFRAA 80* 06/26/2014 0642   GFRAA 52* 05/03/2014 1150    GFR Estimated Creatinine Clearance: 50.8 ml/min (by C-G formula based on Cr of 0.99). No results found for this basename: LIPASE, AMYLASE,  in the last 72 hours No results found for this basename: CKTOTAL, CKMB, CKMBINDEX, TROPONINI,  in the last 72 hours No components found with this basename: POCBNP,  No results found for this basename: DDIMER,  in the last 72 hours No results found for this basename: HGBA1C,  in the last 72 hours No results found for this basename: CHOL, HDL, LDLCALC, TRIG, CHOLHDL, LDLDIRECT,  in the last 72 hours No results found for this basename: TSH, T4TOTAL, FREET3, T3FREE, THYROIDAB,  in the last 72 hours No results found for this basename: VITAMINB12, FOLATE, FERRITIN, TIBC, IRON, RETICCTPCT,  in the last 72 hours Coags: No results found for this basename: PT, INR,  in the last 72 hours Microbiology: Recent Results (from the past 240 hour(s))    URINE CULTURE     Status: None   Collection Time    06/22/14 10:52 PM      Result Value Ref Range Status   Specimen Description URINE, RANDOM   Final   Special Requests NONE  Final   Culture  Setup Time     Final   Value: 06/23/2014 16:55     Performed at [place]     Final   Value: >=100,000 COLONIES/ML     Performed at [facility]   Culture     Final   Value: Multiple bacterial morphotypes present, none predominant. Suggest appropriate recollection if clinically indicated.     Performed at [facility]   Report Status 06/25/2014 FINAL   Final  MRSA PCR SCREENING     Status: None   Collection Time    06/23/14  3:01 PM      Result Value Ref Range Status   MRSA by PCR NEGATIVE  NEGATIVE Final   Comment:            The GeneXpert MRSA Assay (FDA     approved for NASAL specimens     only), is one component of a     comprehensive MRSA colonization     surveillance program. It is not     intended to diagnose MRSA     infection nor to guide or     monitor treatment for     MRSA infections.     BRIEF HOSPITAL COURSE:  Principal Problem:  Sepsis  - suspected to be due to UTI  - UA revealed large leukocytes and many bacteria-but cultures showing multiple morphotypes  - Patient was started on emipric Rocephin, will transition to Cipro and complete  7 day course   Active Problems:  Hypotension  - due to sepsis vs dehydration, suspect underlying relative adrenal insufficiently  -has resolved with supportive care, IVF and stress dose Hydrocortisone -BP now stable, Hydrocortisone being tapered off as inpatient, will discharge on tapering prednisone for a few days  Hyponatremia  -resolved  -likely secondary to diuretic therapy,dehydration. Diurectics discontinued permanently-poor oral intake and failure to thrive. -monitor lytes periodically as outpatient  Acute Urinary Retention  -foley re-inserted 7/15-as residual urine close to  1000 cc on bladder ultrasound  -spoke with Dr Wendy Poet on 7/15 and Dr Kellie Simmering on 7/16. Per Dr Janice Norrie, to continue with Foley catheter on discharge, and to follow with Urology as outpatient for voiding trial and for consideration of supra pubic catheter placement.  Hematuria  -suspect likely secondary to foley trauma. Re-occurred on 7/15. Resolved with supportive care, Urine clear on day of discharge.  Urethral meatus ulcer  -foley was present on admission, removed because of meatal ulcer with necrosis-plans were to observe-unfortunately developed acute urinary retention on 7/15-foley re-inserted. Case d/w Dr Janice Norrie over the phone on 7/15-ok to continue with Foley, follow up with urology as outpatient for consideration of supra-pubic catheter placement. Continue with Silver Sulfadiazine BID  H/o systolic CHF  - previous ECHO in 2013 revealed EF of 45%- repeat ECHO reveals this to have normalized- no further need for diuretics. -linically remains compensated   Lytic bone lesions on xray/ elevated Alk phos  - elevated PSA-  no other work up as discussed with grandson by Dr Wynelle Cleveland. This MD also spoke with grand-daughter on 7/17-Joa,[name]-she informed me that family had a discussion, they do not desire any further work up.  -Suspect will need hospice referral, will defer to Primary care practitioner  shoulder pain  - admitted for right shoulder pain - xray were negative- better-c/w supportive measures  Dementia  -stable-no obvious delirium at present  [place]:  Subjective:   Joshua Norton today has no  major complaints. Family wants to take patient home and not to SNF Objective:   Blood pressure 140/78, pulse 59, temperature 97.8 F (36.6 C), temperature source Oral, resp. rate 16, height 5' 8.5" (1.74 m), weight 84 kg (185 lb 3 oz), SpO2 99.00%.  Intake/Output Summary (Last 24 hours) at 06/27/14 1358 Last data filed at 06/27/14 0706  Gross per 24 hour  Intake   1321 ml    Output   1625 ml  Net   -304 ml   Filed Weights   06/23/14 1520 06/24/14 0600 06/25/14 0600  Weight: 83 kg (182 lb 15.7 oz) 83.3 kg (183 lb 10.3 oz) 84 kg (185 lb 3 oz)    Exam Awake Alert,No new F.N deficits, Normal affect [place].AT,PERRAL Supple Neck,No JVD, No cervical lymphadenopathy appriciated.  Symmetrical Chest wall movement, Good air movement bilaterally, CTAB RRR,No Gallops,Rubs or new Murmurs, No Parasternal Heave +ve B.Sounds, Abd Soft, Non tender, No organomegaly appriciated, No rebound -guarding or rigidity. No Cyanosis, Clubbing or edema, No new Rash or bruise  DISCHARGE CONDITION: Stable  DISPOSITION: Home DISCHARGE INSTRUCTIONS:    Activity:  As tolerated with Full fall precautions use walker/cane & assistance as needed  Diet recommendation: Heart Healthy diet      Discharge Instructions   Call MD for:  persistant nausea and vomiting    Complete by:  As directed      Call MD for:  redness, tenderness, or signs of infection (pain, swelling, redness, odor or green/yellow discharge around incision site)    Complete by:  As directed      Call MD for:  severe uncontrolled pain    Complete by:  As directed      Diet - low sodium heart healthy    Complete by:  As directed      Increase activity slowly    Complete by:  As directed            Follow-up Information   Follow up with [name], MD. Schedule an appointment as soon as possible for a visit in 1 week.   Specialty:  Internal Medicine   Contact information:   [address] [place] [address] [phone]       Follow up with Claybon Jabs, MD. Schedule an appointment as soon as possible for a visit in 1 week.   Specialty:  Urology   Contact information:   [address] [place] [address] [phone]         Total Time spent on discharge equals 45 minutes.  SignedOren Binet 06/27/2014 1:58 PM  **Disclaimer: This note may have been dictated  with voice recognition software. Similar sounding words can inadvertently be transcribed and this note may contain transcription errors which may not have been corrected upon publication of note.**

## 2014-06-27 NOTE — Progress Notes (Signed)
Called pt's granddaughter to make sure pt's bed had arrived and that she was home. Granddaughter called back. Call then made to [facility] to get pt a ride home. Was told by [facility] that the nurse needed to know who was being charged for he pt's ride before they could come and pick the pt up. Paged the case manager on call to get help with the information on who was paying for the pt's transport home. Case manager called back with the information.  [facility] was paged once again to come back and pick up pt. [facility] at bedside to pick up pt. Spoke with the pt's granddaughter to make sure that she was at home. Pt's IV was removed, pt was switched to a disposable gown, and is now ready to be transported home.

## 2014-06-27 NOTE — Clinical Social Work Note (Signed)
3:30PM: CSW received call from RN requesting assistance with ambulance transport for patient to go home. CSW requested ambulance transport for 4:30PM  4:20PM: RNCM states that the patient's hospital bed has not arrived at the home and that ambulance transport needs to be cancelled. CSW cancelled transport. CSW explained to RN that RN will need to call for ambulance transport once bed is at home. CSW provided evening ambulance transport number on patient's prepared DC packet.  9:21PM: CSW received call from [facility] stating that RN was told by ambulance transport company that they would not pick patient up until they knew who would be paying for the transport. CSW explained to [facility] that this has never happened before and that RN should just inform transport company that patient has [facility] which they can bill for the transport. RNCM to call unit RN back.

## 2014-06-27 NOTE — Progress Notes (Signed)
Nsg Discharge Note  Admit Date:  06/22/2014 Discharge date: 06/27/2014   Edie Darley to be D/C'd Home per MD order.  AVS completed.  Copy for chart, and copy for patient signed, and dated. Patient/caregiver able to verbalize understanding.  Discharge Medication:   Medication List    STOP taking these medications       furosemide 40 MG tablet  Commonly known as:  LASIX     quinapril 20 MG tablet  Commonly known as:  ACCUPRIL      TAKE these medications       amLODipine 2.5 MG tablet  Commonly known as:  NORVASC  Take 1 tablet (2.5 mg total) by mouth daily.     aspirin EC 325 MG tablet  Take 325 mg by mouth at bedtime.     bimatoprost 0.01 % Soln  Commonly known as:  LUMIGAN  Place 1 drop into both eyes at bedtime.     brinzolamide 1 % ophthalmic suspension  Commonly known as:  AZOPT  Place 1 drop into both eyes 2 (two) times daily.     ciprofloxacin 500 MG tablet  Commonly known as:  CIPRO  Take 1 tablet (500 mg total) by mouth 2 (two) times daily.     fexofenadine 180 MG tablet  Commonly known as:  ALLEGRA  Take 180 mg by mouth at bedtime.     finasteride 5 MG tablet  Commonly known as:  PROSCAR  Take 5 mg by mouth at bedtime.     FLUoxetine 20 MG capsule  Commonly known as:  PROZAC  Take 20 mg by mouth daily.     GLUCOSAMINE-CHONDROITIN-MSM PO  Take 1 tablet by mouth at bedtime. Glucosamine 1500 mg/ chrondroiton 1200 mg     HYDROcodone-acetaminophen 5-325 MG per tablet  Commonly known as:  NORCO/VICODIN  Take 1 tablet by mouth every 6 (six) hours as needed for moderate pain.     multivitamin with minerals Tabs tablet  Take 1 tablet by mouth at bedtime.     pantoprazole 20 MG tablet  Commonly known as:  PROTONIX  Take 20 mg by mouth at bedtime.     predniSONE 10 MG tablet  Commonly known as:  DELTASONE  - Take 30 mg (3 tablets) daily for 1 day,then,  - Take 20 mg (2 tablets) daily for 1 day,then,  - Take 10 mg (1 tablets) daily for 1 day,then  stop     silver sulfADIAZINE 1 % cream  Commonly known as:  SILVADENE  Apply topically 2 (two) times daily. Apply to penis twice daily     vitamin B-12 1000 MCG tablet  Commonly known as:  CYANOCOBALAMIN  Take 1,000 mcg by mouth at bedtime.     Vitamin D 2000 UNITS tablet  Take 2,000 Units by mouth at bedtime.        Discharge Assessment: Filed Vitals:   06/27/14 1405  BP: 119/71  Pulse: 66  Temp: 98.3 F (36.8 C)  Resp: 18  Stage II noted to buttocks, communicated with family. Barrier cream applied. Foley intact.  IV catheter discontinued intact. Site without signs and symptoms of complications - no redness or edema noted at insertion site, patient denies c/o pain - only slight tenderness at site.  Dressing with slight pressure applied.  D/c Instructions-Education: Discharge instructions given to patient/family with verbalized understanding. D/c education completed with patient/family including follow up instructions, medication list, d/c activities limitations if indicated, with other d/c instructions as indicated by MD - patient  able to verbalize understanding, all questions fully answered. Patient instructed to return to ED, call 911, or call MD for any changes in condition.  Patient escorted via [place], and D/C home via private auto.  Dayle Points, RN 06/27/2014 6:45 PM

## 2014-06-27 NOTE — Progress Notes (Signed)
Called pt's granddaughter to see if pt's bed arrived. [place] daughter did not answer. Left a voicemail. Waiting for a call back

## 2014-06-27 NOTE — Discharge Instructions (Signed)
Follow with Primary MD  [name], MD  and Dr Larinda Buttery (Urology) as instructed your Hospitalist MD  Please get a complete blood count and chemistry panel checked by your Primary MD at your next visit, and again as instructed by your Primary MD.  Get Medicines reviewed and adjusted. Please take all your medications with you for your next visit with your Primary MD  Please request your Primary MD to go over all hospital tests and procedure/radiological results at the follow up, please ask your Primary MD to get all Hospital records sent to his/her office.  If you experience worsening of your admission symptoms, develop shortness of breath, life threatening emergency, suicidal or homicidal thoughts you must seek medical attention immediately by calling 911 or calling your MD immediately  if symptoms less severe.  You must read complete instructions/literature along with all the possible adverse reactions/side effects for all the Medicines you take and that have been prescribed to you. Take any new Medicines after you have completely understood and accpet all the possible adverse reactions/side effects.   Do not drive when taking Pain medications.   Do not take more than prescribed Pain, Sleep and Anxiety Medications  Special Instructions: If you have smoked or chewed Tobacco  in the last 2 yrs please stop smoking, stop any regular Alcohol  and or any Recreational drug use.  Wear Seat belts while driving.  Please note  You were cared for by a hospitalist during your hospital stay. Once you are discharged, your primary care physician will handle any further medical issues. Please note that NO REFILLS for any discharge medications will be authorized once you are discharged, as it is imperative that you return to your primary care physician (or establish a relationship with a primary care physician if you do not have one) for your aftercare needs so that they can reassess your need for  medications and monitor your lab values.

## 2014-07-01 ENCOUNTER — Inpatient Hospital Stay (HOSPITAL_COMMUNITY)
Admission: EM | Admit: 2014-07-01 | Discharge: 2014-07-03 | DRG: 641 | Disposition: A | Discharge status: Hospice | Payer: Medicare HMO | Attending: Internal Medicine | Admitting: Internal Medicine

## 2014-07-01 ENCOUNTER — Encounter (HOSPITAL_COMMUNITY): Payer: Self-pay | Admitting: Emergency Medicine

## 2014-07-01 ENCOUNTER — Ambulatory Visit: Payer: Self-pay | Admitting: Physician Assistant

## 2014-07-01 ENCOUNTER — Emergency Department (HOSPITAL_COMMUNITY): Payer: Medicare HMO

## 2014-07-01 DIAGNOSIS — N4889 Other specified disorders of penis: Secondary | ICD-10-CM | POA: Diagnosis present

## 2014-07-01 DIAGNOSIS — E871 Hypo-osmolality and hyponatremia: Principal | ICD-10-CM | POA: Diagnosis present

## 2014-07-01 DIAGNOSIS — Y846 Urinary catheterization as the cause of abnormal reaction of the patient, or of later complication, without mention of misadventure at the time of the procedure: Secondary | ICD-10-CM | POA: Diagnosis present

## 2014-07-01 DIAGNOSIS — E785 Hyperlipidemia, unspecified: Secondary | ICD-10-CM | POA: Diagnosis present

## 2014-07-01 DIAGNOSIS — C7951 Secondary malignant neoplasm of bone: Secondary | ICD-10-CM | POA: Diagnosis present

## 2014-07-01 DIAGNOSIS — I10 Essential (primary) hypertension: Secondary | ICD-10-CM | POA: Diagnosis present

## 2014-07-01 DIAGNOSIS — M1990 Unspecified osteoarthritis, unspecified site: Secondary | ICD-10-CM | POA: Diagnosis present

## 2014-07-01 DIAGNOSIS — Z823 Family history of stroke: Secondary | ICD-10-CM

## 2014-07-01 DIAGNOSIS — R7309 Other abnormal glucose: Secondary | ICD-10-CM | POA: Diagnosis present

## 2014-07-01 DIAGNOSIS — M949 Disorder of cartilage, unspecified: Secondary | ICD-10-CM

## 2014-07-01 DIAGNOSIS — H409 Unspecified glaucoma: Secondary | ICD-10-CM | POA: Diagnosis present

## 2014-07-01 DIAGNOSIS — I4949 Other premature depolarization: Secondary | ICD-10-CM | POA: Diagnosis present

## 2014-07-01 DIAGNOSIS — N390 Urinary tract infection, site not specified: Secondary | ICD-10-CM | POA: Diagnosis present

## 2014-07-01 DIAGNOSIS — Z803 Family history of malignant neoplasm of breast: Secondary | ICD-10-CM

## 2014-07-01 DIAGNOSIS — Z6827 Body mass index (BMI) 27.0-27.9, adult: Secondary | ICD-10-CM

## 2014-07-01 DIAGNOSIS — I428 Other cardiomyopathies: Secondary | ICD-10-CM | POA: Diagnosis present

## 2014-07-01 DIAGNOSIS — I493 Ventricular premature depolarization: Secondary | ICD-10-CM | POA: Diagnosis present

## 2014-07-01 DIAGNOSIS — E559 Vitamin D deficiency, unspecified: Secondary | ICD-10-CM | POA: Diagnosis present

## 2014-07-01 DIAGNOSIS — R627 Adult failure to thrive: Secondary | ICD-10-CM | POA: Diagnosis present

## 2014-07-01 DIAGNOSIS — C7952 Secondary malignant neoplasm of bone marrow: Secondary | ICD-10-CM

## 2014-07-01 DIAGNOSIS — T83511A Infection and inflammatory reaction due to indwelling urethral catheter, initial encounter: Secondary | ICD-10-CM | POA: Diagnosis present

## 2014-07-01 DIAGNOSIS — Z791 Long term (current) use of non-steroidal anti-inflammatories (NSAID): Secondary | ICD-10-CM

## 2014-07-01 DIAGNOSIS — Z8546 Personal history of malignant neoplasm of prostate: Secondary | ICD-10-CM

## 2014-07-01 DIAGNOSIS — N485 Ulcer of penis: Secondary | ICD-10-CM | POA: Diagnosis present

## 2014-07-01 DIAGNOSIS — Z96659 Presence of unspecified artificial knee joint: Secondary | ICD-10-CM

## 2014-07-01 DIAGNOSIS — Z7982 Long term (current) use of aspirin: Secondary | ICD-10-CM

## 2014-07-01 DIAGNOSIS — F0390 Unspecified dementia without behavioral disturbance: Secondary | ICD-10-CM | POA: Diagnosis present

## 2014-07-01 DIAGNOSIS — Z66 Do not resuscitate: Secondary | ICD-10-CM | POA: Diagnosis present

## 2014-07-01 DIAGNOSIS — M899 Disorder of bone, unspecified: Secondary | ICD-10-CM

## 2014-07-01 DIAGNOSIS — I519 Heart disease, unspecified: Secondary | ICD-10-CM | POA: Diagnosis present

## 2014-07-01 DIAGNOSIS — I420 Dilated cardiomyopathy: Secondary | ICD-10-CM | POA: Diagnosis present

## 2014-07-01 DIAGNOSIS — Z888 Allergy status to other drugs, medicaments and biological substances status: Secondary | ICD-10-CM

## 2014-07-01 HISTORY — DX: Transient cerebral ischemic attack, unspecified: G45.9

## 2014-07-01 HISTORY — DX: Unspecified dementia, unspecified severity, without behavioral disturbance, psychotic disturbance, mood disturbance, and anxiety: F03.90

## 2014-07-01 LAB — URINALYSIS, ROUTINE W REFLEX MICROSCOPIC
Glucose, UA: NEGATIVE mg/dL
Ketones, ur: 40 mg/dL — AB
Nitrite: POSITIVE — AB
PROTEIN: 100 mg/dL — AB
SPECIFIC GRAVITY, URINE: 1.021 (ref 1.005–1.030)
UROBILINOGEN UA: 1 mg/dL (ref 0.0–1.0)
pH: 5.5 (ref 5.0–8.0)

## 2014-07-01 LAB — COMPREHENSIVE METABOLIC PANEL
ALK PHOS: 296 U/L — AB (ref 39–117)
ALT: 17 U/L (ref 0–53)
ALT: 17 U/L (ref 0–53)
AST: 24 U/L (ref 0–37)
AST: 22 U/L (ref 0–37)
Albumin: 2.2 g/dL — ABNORMAL LOW (ref 3.5–5.2)
Albumin: 2.2 g/dL — ABNORMAL LOW (ref 3.5–5.2)
Alkaline Phosphatase: 314 U/L — ABNORMAL HIGH (ref 39–117)
Anion gap: 17 — ABNORMAL HIGH (ref 5–15)
Anion gap: 11 (ref 5–15)
BILIRUBIN TOTAL: 0.7 mg/dL (ref 0.3–1.2)
BUN: 13 mg/dL (ref 6–23)
BUN: 12 mg/dL (ref 6–23)
CALCIUM: 8.1 mg/dL — AB (ref 8.4–10.5)
CALCIUM: 8 mg/dL — AB (ref 8.4–10.5)
CHLORIDE: 89 meq/L — AB (ref 96–112)
CO2: 24 meq/L (ref 19–32)
CO2: 24 mEq/L (ref 19–32)
Chloride: 86 mEq/L — ABNORMAL LOW (ref 96–112)
Creatinine, Ser: 0.74 mg/dL (ref 0.50–1.35)
Creatinine, Ser: 0.72 mg/dL (ref 0.50–1.35)
GFR calc Af Amer: >90 mL/min (ref >90)
GFR calc Af Amer: >90 mL/min (ref >90)
GFR calc non Af Amer: 78 mL/min — ABNORMAL LOW (ref >90)
GFR, EST NON AFRICAN AMERICAN: 79 mL/min — AB (ref >90)
Glucose, Bld: 121 mg/dL — ABNORMAL HIGH (ref 70–99)
Glucose, Bld: 129 mg/dL — ABNORMAL HIGH (ref 70–99)
POTASSIUM: 3.4 meq/L — AB (ref 3.7–5.3)
Potassium: 3.9 mEq/L (ref 3.7–5.3)
Sodium: 127 mEq/L — ABNORMAL LOW (ref 137–147)
Sodium: 124 mEq/L — ABNORMAL LOW (ref 137–147)
TOTAL PROTEIN: 5.9 g/dL — AB (ref 6.0–8.3)
Total Bilirubin: 0.7 mg/dL (ref 0.3–1.2)
Total Protein: 5.9 g/dL — ABNORMAL LOW (ref 6.0–8.3)

## 2014-07-01 LAB — CBC WITH DIFFERENTIAL/PLATELET
BASOS ABS: 0 10*3/uL (ref 0.0–0.1)
Basophils Relative: 0 % (ref 0–1)
EOS PCT: 0 % (ref 0–5)
Eosinophils Absolute: 0 10*3/uL (ref 0.0–0.7)
HCT: 34.1 % — ABNORMAL LOW (ref 39.0–52.0)
Hemoglobin: 11.6 g/dL — ABNORMAL LOW (ref 13.0–17.0)
Lymphocytes Relative: 14 % (ref 12–46)
Lymphs Abs: 1.6 10*3/uL (ref 0.7–4.0)
MCH: 28.6 pg (ref 26.0–34.0)
MCHC: 34 g/dL (ref 30.0–36.0)
MCV: 84 fL (ref 78.0–100.0)
Monocytes Absolute: 0.9 10*3/uL (ref 0.1–1.0)
Monocytes Relative: 7 % (ref 3–12)
Neutro Abs: 9.5 10*3/uL — ABNORMAL HIGH (ref 1.7–7.7)
Neutrophils Relative %: 79 % — ABNORMAL HIGH (ref 43–77)
PLATELETS: 400 10*3/uL (ref 150–400)
RBC: 4.06 MIL/uL — AB (ref 4.22–5.81)
RDW: 15.4 % (ref 11.5–15.5)
WBC: 12 10*3/uL — ABNORMAL HIGH (ref 4.0–10.5)

## 2014-07-01 LAB — URINE MICROSCOPIC-ADD ON

## 2014-07-01 LAB — I-STAT TROPONIN, ED: Troponin i, poc: 0.06 ng/mL (ref 0.00–0.08)

## 2014-07-01 LAB — PRO B NATRIURETIC PEPTIDE: Pro B Natriuretic peptide (BNP): 4603 pg/mL — ABNORMAL HIGH (ref 0–450)

## 2014-07-01 MED ORDER — BRINZOLAMIDE 1 % OP SUSP
1.0000 [drp] | Freq: Two times a day (BID) | OPHTHALMIC | Status: DC
  Administered 2014-07-01 – 2014-07-03 (×4): 1 [drp] via OPHTHALMIC
  Filled 2014-07-01: qty 10

## 2014-07-01 MED ORDER — PANTOPRAZOLE SODIUM 20 MG PO TBEC
20.0000 mg | DELAYED_RELEASE_TABLET | Freq: Every day | ORAL | Status: DC
  Administered 2014-07-01 – 2014-07-02 (×2): 20 mg via ORAL
  Filled 2014-07-01 (×4): qty 1

## 2014-07-01 MED ORDER — FINASTERIDE 5 MG PO TABS
5.0000 mg | ORAL_TABLET | Freq: Every day | ORAL | Status: DC
  Administered 2014-07-01 – 2014-07-02 (×2): 5 mg via ORAL
  Filled 2014-07-01 (×3): qty 1

## 2014-07-01 MED ORDER — ASPIRIN EC 325 MG PO TBEC
325.0000 mg | DELAYED_RELEASE_TABLET | Freq: Every day | ORAL | Status: DC
  Administered 2014-07-01 – 2014-07-02 (×2): 325 mg via ORAL
  Filled 2014-07-01 (×3): qty 1

## 2014-07-01 MED ORDER — ENOXAPARIN SODIUM 40 MG/0.4ML ~~LOC~~ SOLN
40.0000 mg | SUBCUTANEOUS | Status: DC
  Administered 2014-07-01 – 2014-07-02 (×2): 40 mg via SUBCUTANEOUS
  Filled 2014-07-01 (×3): qty 0.4

## 2014-07-01 MED ORDER — VANCOMYCIN HCL 10 G IV SOLR
1500.0000 mg | INTRAVENOUS | Status: DC
  Administered 2014-07-01: 1500 mg via INTRAVENOUS
  Filled 2014-07-01 (×2): qty 1500

## 2014-07-01 MED ORDER — ACETAMINOPHEN 650 MG RE SUPP: 650.0000 mg | Freq: Four times a day (QID) | RECTAL | Status: DC | PRN

## 2014-07-01 MED ORDER — AMLODIPINE BESYLATE 2.5 MG PO TABS
2.5000 mg | ORAL_TABLET | Freq: Every day | ORAL | Status: DC
  Administered 2014-07-01 – 2014-07-03 (×3): 2.5 mg via ORAL
  Filled 2014-07-01 (×3): qty 1

## 2014-07-01 MED ORDER — SILVER SULFADIAZINE 1 % EX CREA
TOPICAL_CREAM | Freq: Two times a day (BID) | CUTANEOUS | Status: DC
  Administered 2014-07-02 – 2014-07-03 (×3): via TOPICAL
  Filled 2014-07-01: qty 85

## 2014-07-01 MED ORDER — FLUOXETINE HCL 20 MG PO CAPS
20.0000 mg | ORAL_CAPSULE | Freq: Every day | ORAL | Status: DC
  Administered 2014-07-01 – 2014-07-03 (×3): 20 mg via ORAL
  Filled 2014-07-01 (×3): qty 1

## 2014-07-01 MED ORDER — SODIUM CHLORIDE 0.9 % IV BOLUS (SEPSIS)
500.0000 mL | Freq: Once | INTRAVENOUS | Status: AC
  Administered 2014-07-01: 500 mL via INTRAVENOUS

## 2014-07-01 MED ORDER — ACETAMINOPHEN 325 MG PO TABS
650.0000 mg | ORAL_TABLET | Freq: Once | ORAL | Status: AC
Start: 2014-07-01 — End: 2014-07-01
  Administered 2014-07-01: 650 mg via ORAL
  Filled 2014-07-01: qty 2

## 2014-07-01 MED ORDER — HYDROCODONE-ACETAMINOPHEN 5-325 MG PO TABS: 1.0000 | ORAL_TABLET | Freq: Four times a day (QID) | ORAL | Status: DC | PRN

## 2014-07-01 MED ORDER — ACETAMINOPHEN 325 MG PO TABS
650.0000 mg | ORAL_TABLET | Freq: Four times a day (QID) | ORAL | Status: DC | PRN
  Administered 2014-07-01 – 2014-07-03 (×4): 650 mg via ORAL
  Filled 2014-07-01 (×4): qty 2

## 2014-07-01 NOTE — H&P (Signed)
[facility] History and Physical  Patient: Joshua Norton  BZJ:[id number]  DOB: [birth year]  DOS: the patient was seen and examined on 07/01/2014 PCP: [name], MD  Chief Complaint: Hematuria  HPI: Joshua Norton is a 80 y.o. male with Past medical history of diastolic dysfunction, hypertension, possible prostate cancer with cervical lytic lesions, penile ulcer with necrosis, chronic indwelling Foley, dementia. Patient was brought in today by his granddaughter. Patient was scheduled to followup with his PCP today for further discussion regarding hospice versus home care. On the day she was outside today he started having episodes of nausea and vomiting. At the same time the granddaughter also noted that the patient also had some hematuria and the patient started having some confusion with lightheadedness and therefore they brought him to the hospital. Since patient's discharge his appetite has been improved per family. There is no more swelling of his legs. He has been more short of breath. No cough. No fever no chills no trauma no injury. Patient has not yet seen the urologist Patient has completed his antibiotic course for the UTI. During his last hospitalization his sodium was low and he was on IV hydration to improve his sodium therefore his Lasix was stopped. Family mentions that at the time of discharge the patient will not go to a nursing home  The patient is coming from home. And at his baseline dependent for most of his ADL.  Review of Systems: as mentioned in the history of present illness.  A Comprehensive review of the other systems is negative.  Past Medical History  Diagnosis Date  . Edema of both legs 06/09/2012    2D Echo - EF 40-45, left ventricle cavity moderately dilated  . HTN (hypertension), benign     On multiple medications  . Osteoarthritis (arthritis due to wear and tear of joints)     S/p L Knee Arthroplasty  . Diastolic dysfunction     Chronic   . Cardiomyopathy     EF 40-45% 2D 6/13  . Prediabetes   . Hyperlipidemia   . Anemia   . Vitamin D deficiency   . Glaucoma    Past Surgical History  Procedure Laterality Date  . Left knee arthroplasty  09/2003  . Transurethral resection of prostate    . Cataract extraction    . Cervical laminectomy     Social History:  reports that he has never smoked. He has never used smokeless tobacco. He reports that he does not drink alcohol or use illicit drugs.  Allergies  Allergen Reactions  . Doxazosin Swelling    Family History  Problem Relation Age of Onset  . Breast cancer Mother   . Stroke Brother     Prior to Admission medications   Medication Sig Start Date End Date Taking? Authorizing Provider  amLODipine (NORVASC) 2.5 MG tablet Take 2.5 mg by mouth daily.   Yes Historical Provider, MD  aspirin EC 325 MG tablet Take 325 mg by mouth at bedtime.   Yes Historical Provider, MD  bimatoprost (LUMIGAN) 0.01 % SOLN Place 1 drop into both eyes at bedtime.   Yes Historical Provider, MD  brinzolamide (AZOPT) 1 % ophthalmic suspension Place 1 drop into both eyes 2 (two) times daily.   Yes Historical Provider, MD  Cholecalciferol (VITAMIN D) 2000 UNITS tablet Take 2,000 Units by mouth at bedtime.   Yes Historical Provider, MD  fexofenadine (ALLEGRA) 180 MG tablet Take 180 mg by mouth at bedtime.    Yes Historical Provider,  MD  finasteride (PROSCAR) 5 MG tablet Take 5 mg by mouth at bedtime.   Yes Historical Provider, MD  FLUoxetine (PROZAC) 20 MG capsule Take 20 mg by mouth daily.   Yes Historical Provider, MD  GLUCOSAMINE-CHONDROITIN-MSM PO Take 1 tablet by mouth at bedtime. Glucosamine 1500 mg/ chrondroiton 1200 mg   Yes Historical Provider, MD  HYDROcodone-acetaminophen (NORCO/VICODIN) 5-325 MG per tablet Take 1 tablet by mouth every 6 (six) hours as needed for moderate pain.   Yes Historical Provider, MD  Multiple Vitamin (MULTIVITAMIN WITH MINERALS) TABS tablet Take 1 tablet by mouth  at bedtime.   Yes Historical Provider, MD  pantoprazole (PROTONIX) 20 MG tablet Take 20 mg by mouth at bedtime.    Yes Historical Provider, MD  silver sulfADIAZINE (SILVADENE) 1 % cream Apply topically 2 (two) times daily. Apply to penis twice daily 06/27/14  Yes Shanker Kristeen Mans, MD  vitamin B-12 (CYANOCOBALAMIN) 1000 MCG tablet Take 1,000 mcg by mouth at bedtime.   Yes Historical Provider, MD    Physical Exam: Filed Vitals:   07/01/14 1930 07/01/14 2010 07/01/14 2030 07/01/14 2105  BP: 120/67 134/62 119/58 122/66  Pulse:  101  92  Temp:    97.7 F (36.5 C)  TempSrc:    Oral  Resp:  21 20 15   Height:  5' 8.5" (1.74 m)    Weight:  84 kg (185 lb 3 oz)    SpO2:  99%  100%    General: Alert, Awake and Oriented to Time, Place and Person. Appear in mild distress Eyes: PERRL ENT: Oral Mucosa clear moist. Neck: no JVD Cardiovascular: S1 and S2 Present, no Murmur, Peripheral Pulses Present Respiratory: Bilateral Air entry equal and Decreased, Clear to Auscultation, noCrackles, no wheezes Abdomen: Bowel Sound Present, Soft and Non tender Skin: no Rash, penile ulcer with yellowish base possible drainage circumscribe Extremities: Bilateral Pedal edema, no calf tenderness Neurologic: Grossly no focal neuro deficit.  Labs on Admission:  CBC:  Recent Labs Lab 06/26/14 0642 07/01/14 1556  WBC 14.0* 12.0*  NEUTROABS  --  9.5*  HGB 9.9* 11.6*  HCT 28.7* 34.1*  MCV 84.2 84.0  PLT 358 400    CMP     Component Value Date/Time   NA 124* 07/01/2014 2027   K 3.9 07/01/2014 2027   CL 89* 07/01/2014 2027   CO2 24 07/01/2014 2027   GLUCOSE 129* 07/01/2014 2027   BUN 12 07/01/2014 2027   CREATININE 0.72 07/01/2014 2027   CREATININE 1.35 05/03/2014 1150   CALCIUM 8.0* 07/01/2014 2027   PROT 5.9* 07/01/2014 2027   ALBUMIN 2.2* 07/01/2014 2027   AST 22 07/01/2014 2027   ALT 17 07/01/2014 2027   ALKPHOS 314* 07/01/2014 2027   BILITOT 0.7 07/01/2014 2027   GFRNONAA 79* 07/01/2014 2027   GFRNONAA 45*  05/03/2014 1150   GFRAA >90 07/01/2014 2027   GFRAA 52* 05/03/2014 1150    No results found for this basename: LIPASE, AMYLASE,  in the last 168 hours No results found for this basename: AMMONIA,  in the last 168 hours  No results found for this basename: CKTOTAL, CKMB, CKMBINDEX, TROPONINI,  in the last 168 hours BNP (last 3 results)  Recent Labs  07/01/14 1556  PROBNP 4603.0*    Radiological Exams on Admission: Dg Chest 2 View  07/01/2014   CLINICAL DATA:  Nausea, vomiting.  EXAM: CHEST  2 VIEW  COMPARISON:  06/24/2014  FINDINGS: Small bilateral effusions. Bibasilar opacities, favor atelectasis. Heart is normal  size. No acute bony abnormality.  IMPRESSION: Small bilateral pleural effusions with bibasilar opacities, favor atelectasis.   Electronically Signed   By: Rolm Baptise M.D.   On: 07/01/2014 18:05    Assessment/Plan Principal Problem:   Hyponatremia Active Problems:   HTN (hypertension), benign   Congestive dilated cardiomyopathy- EF 40-45% 2D 6/13   PVC's (premature ventricular contractions)   UTI (lower urinary tract infection)   Dementia   Penile ulcer   1. Hyponatremia Diastolic dysfunction and cardiomyopathy. Patient presents with complaints of confusion, nausea vomiting, bilateral worsening swelling. He is found to have elevated proBNP that his baseline. He has bilateral swelling and some crackles on his examination. His sodium has been reduced again. He is not taking his Lasix at home. His appetite has improved but his albumin level has not improved. With this patient is currently being admitted in the hospital. We'll continue to monitor his sodium every couple of hours to maintain gradual correction. With elevated proBNP is sodium could be low secondary to hypervolemia and therefore Currently I would put him on a fluid restricted diet. Unfortunately the patient has received 500 cc of normal saline bolus in the ED. We may need to do further workup if there is  no improvement in his serum sodium including serum and urine osmolality, TSH, Fena  2. Blood in the urine Penile necrotic ulcer The family was explained that with his history of prostate cancer and benign necrotic answered he will have imaging via. Although his penile ulcer appears to be infected and therefore the patient will be started on broad-spectrum antibiotics with IV vancomycin and IV Zosyn. We'll continue to monitor him in hospital with cultures and wound care.  3. Dementia. At present does not appear indicated continue to monitor.  4. Possible prostatic cancer. He is a significantly elevated patient has likely this is at his cervical spine. Thus he appears to have possible prostatic cancer. Family has decided not to go for any further workup or treatment. Family is leaning forward to hospice. We may need to contact social services for arrangement of hospice for the patient at home.  DVT Prophylaxis: subcutaneous Heparin, with his history of cancer Nutrition: Full  Code Status: DNR/DNI  Family Communication: [place] daughter was present at bedside, opportunity was given to ask question and all questions were answered satisfactorily at the time of interview. Disposition: Admitted to observation in med-surge unit.  Author: Berle Mull, MD [facility] Pager: [phone] 07/01/2014, 11:33 PM    If 7PM-7AM, please contact night-coverage [email or web address] Password [email or web address]  **Disclaimer: This note may have been dictated with voice recognition software. Similar sounding words can inadvertently be transcribed and this note may contain transcription errors which may not have been corrected upon publication of note.**

## 2014-07-01 NOTE — ED Notes (Addendum)
Admitting MD at bedside.

## 2014-07-01 NOTE — ED Notes (Addendum)
WAS SITTING in  W/chair and was sitting on catheter so it was bent granddaughter noticed blood in urine in bag jsut started this am. Carola Frost states that pt just home from hospital  Last Thursday 7/18

## 2014-07-01 NOTE — Progress Notes (Signed)
ANTIBIOTIC CONSULT NOTE - INITIAL  Pharmacy Consult for Vancomycin Indication: cellulitis  Allergies  Allergen Reactions  . Doxazosin Swelling    Patient Measurements: Height: 5' 8.5" (174 cm) Weight: 185 lb 3 oz (84 kg) IBW/kg (Calculated) : 69.55  Vital Signs: Temp: 97.5 F (36.4 C) (07/20 1503) BP: 134/62 mmHg (07/20 2010) Pulse Rate: 101 (07/20 2010) Intake/Output from previous day:   Intake/Output from this shift:    Labs:  Recent Labs  07/01/14 1556  WBC 12.0*  HGB 11.6*  PLT 400  CREATININE 0.74   Estimated Creatinine Clearance: 62.8 ml/min (by C-G formula based on Cr of 0.74). No results found for this basename: VANCOTROUGH, Corlis Leak, VANCORANDOM, GENTTROUGH, GENTPEAK, GENTRANDOM, TOBRATROUGH, TOBRAPEAK, TOBRARND, AMIKACINPEAK, AMIKACINTROU, AMIKACIN,  in the last 72 hours   Microbiology: Recent Results (from the past 720 hour(s))  URINE CULTURE     Status: None   Collection Time    06/22/14 10:52 PM      Result Value Ref Range Status   Specimen Description URINE, RANDOM   Final   Special Requests NONE   Final   Culture  Setup Time     Final   Value: 06/23/2014 16:55     Performed at [place]     Final   Value: >=100,000 COLONIES/ML     Performed at [facility]   Culture     Final   Value: Multiple bacterial morphotypes present, none predominant. Suggest appropriate recollection if clinically indicated.     Performed at [facility]   Report Status 06/25/2014 FINAL   Final  MRSA PCR SCREENING     Status: None   Collection Time    06/23/14  3:01 PM      Result Value Ref Range Status   MRSA by PCR NEGATIVE  NEGATIVE Final   Comment:            The GeneXpert MRSA Assay (FDA     approved for NASAL specimens     only), is one component of a     comprehensive MRSA colonization     surveillance program. It is not     intended to diagnose MRSA     infection nor to guide or     monitor treatment for   MRSA infections.    Medical History: Past Medical History  Diagnosis Date  . Edema of both legs 06/09/2012    2D Echo - EF 40-45, left ventricle cavity moderately dilated  . HTN (hypertension), benign     On multiple medications  . Osteoarthritis (arthritis due to wear and tear of joints)     S/p L Knee Arthroplasty  . Diastolic dysfunction     Chronic  . Cardiomyopathy     EF 40-45% 2D 6/13  . Prediabetes   . Hyperlipidemia   . Anemia   . Vitamin D deficiency   . Glaucoma     Medications:  See electronic med rec  Assessment: 80 y.o. male presents with hematuria. Pt with recent hospitalization for sepsis/UTI- just discharged 7/18. To begin Vancomycin for cellulitis. Estimated CrCl ~60 ml/min. Wbc elevated. Bld cultures pending.  Goal of Therapy:  Vancomycin trough level 10-15 mcg/ml  Plan:  1. Vancomycin 1500mg  IV q24h 2. Will f/u micro data, pt's clinical condition, renal function 3. Vanc trough prn  Sherlon Handing, PharmD, BCPS Clinical pharmacist, pager [phone] 07/01/2014,8:17 PM

## 2014-07-01 NOTE — ED Provider Notes (Signed)
CSN: [id number]     Arrival date & time 07/01/14  1452 History   First MD Initiated Contact with Patient 07/01/14 1559     Chief Complaint  Patient presents with  . Hematuria      HPI According to patient's granddaughter who takes care of him they were on their way to their primary care doctor's office when he became nauseated and vomited.  He did not vomit up any material.  They had been sitting outside for a couple of hours waiting on the bus and it was very hot.  He was hydrated.  He states his nausea is gone but he still doesn't feel well.  Patient has an appointment with urologist tomorrow to for evaluation of hematuria.  He had Foley catheter placed on July 5 because of hematuria and is still in place.  Granddaughter thinks he may have set on it in the urine in the bag with blood-tinged which she says is new today.  She said urine had been normal yellow color.  Patient has a history of cardiomyopathy hypertension and anemia. Past Medical History  Diagnosis Date  . Edema of both legs 06/09/2012    2D Echo - EF 40-45, left ventricle cavity moderately dilated  . HTN (hypertension), benign     On multiple medications  . Osteoarthritis (arthritis due to wear and tear of joints)     S/p L Knee Arthroplasty  . Diastolic dysfunction     Chronic  . Cardiomyopathy     EF 40-45% 2D 6/13  . Prediabetes   . Hyperlipidemia   . Anemia   . Vitamin D deficiency   . Glaucoma   . Dementia   . TIA (transient ischemic attack)     hx/notes 04/28/2011   Past Surgical History  Procedure Laterality Date  . Total knee arthroplasty Left 09/2003    Archie Endo 04/28/2011  . Transurethral resection of prostate    . Cervical laminectomy    . Tonsillectomy    . Cataract extraction      pt denies this hx on 07/02/2014  . Lumbar spine surgery  1987    Archie Endo 02/20/2011   Family History  Problem Relation Age of Onset  . Breast cancer Mother   . Stroke Brother    History  Substance Use Topics  . Smoking  status: Never Smoker   . Smokeless tobacco: Never Used  . Alcohol Use: No    Review of Systems  All other systems reviewed and are negative  Allergies  Doxazosin  Home Medications   Prior to Admission medications   Medication Sig Start Date End Date Taking? Authorizing Provider  amLODipine (NORVASC) 2.5 MG tablet Take 2.5 mg by mouth daily.   Yes Historical Provider, MD  aspirin EC 325 MG tablet Take 325 mg by mouth at bedtime.   Yes Historical Provider, MD  bimatoprost (LUMIGAN) 0.01 % SOLN Place 1 drop into both eyes at bedtime.   Yes Historical Provider, MD  brinzolamide (AZOPT) 1 % ophthalmic suspension Place 1 drop into both eyes 2 (two) times daily.   Yes Historical Provider, MD  Cholecalciferol (VITAMIN D) 2000 UNITS tablet Take 2,000 Units by mouth at bedtime.   Yes Historical Provider, MD  fexofenadine (ALLEGRA) 180 MG tablet Take 180 mg by mouth at bedtime.    Yes Historical Provider, MD  finasteride (PROSCAR) 5 MG tablet Take 5 mg by mouth at bedtime.   Yes Historical Provider, MD  FLUoxetine (PROZAC) 20 MG capsule Take 20  mg by mouth daily.   Yes Historical Provider, MD  GLUCOSAMINE-CHONDROITIN-MSM PO Take 1 tablet by mouth at bedtime. Glucosamine 1500 mg/ chrondroiton 1200 mg   Yes Historical Provider, MD  HYDROcodone-acetaminophen (NORCO/VICODIN) 5-325 MG per tablet Take 1 tablet by mouth every 6 (six) hours as needed for moderate pain.   Yes Historical Provider, MD  Multiple Vitamin (MULTIVITAMIN WITH MINERALS) TABS tablet Take 1 tablet by mouth at bedtime.   Yes Historical Provider, MD  pantoprazole (PROTONIX) 20 MG tablet Take 20 mg by mouth at bedtime.    Yes Historical Provider, MD  silver sulfADIAZINE (SILVADENE) 1 % cream Apply topically 2 (two) times daily. Apply to penis twice daily 06/27/14  Yes Shanker Kristeen Mans, MD  vitamin B-12 (CYANOCOBALAMIN) 1000 MCG tablet Take 1,000 mcg by mouth at bedtime.   Yes Historical Provider, MD   BP 117/77  Pulse 84  Temp(Src)  98.3 F (36.8 C) (Oral)  Resp 18  Ht 5' 8.5" (1.74 m)  Wt 185 lb 6.4 oz (84.097 kg)  BMI 27.78 kg/m2  SpO2 99% Physical Exam Physical Exam  Nursing note and vitals reviewed. Constitutional: He is oriented to person, place, and time. He appears well-developed and well-nourished. No distress.  Appears as if he doesn't feel well  HENT:  Head: Normocephalic and atraumatic.  Eyes: Pupils are equal, round, and reactive to light.  Neck: Normal range of motion.  Cardiovascular: Tachycardia and intact distal pulses.   Pulmonary/Chest: No respiratory distress.  Abdominal: Normal appearance. He exhibits no distension.  No focal tenderness no rebound tenderness Genitourinary: Foley catheter in place blood-tinged urine noted in bag Musculoskeletal: Normal range of motion.  Neurological: He is alert and oriented to person, place, and time. No cranial nerve deficit.  Skin: Skin is warm and dry. No rash noted.  Psychiatric: He has a normal mood and affect. His behavior is normal.   ED Course  Procedures (including critical care time) Medications  amLODipine (NORVASC) tablet 2.5 mg (2.5 mg Oral Given 07/02/14 1010)  pantoprazole (PROTONIX) EC tablet 20 mg (20 mg Oral Given 07/01/14 2240)  aspirin EC tablet 325 mg (325 mg Oral Given 07/01/14 2238)  brinzolamide (AZOPT) 1 % ophthalmic suspension 1 drop (1 drop Both Eyes Given 07/02/14 1011)  finasteride (PROSCAR) tablet 5 mg (5 mg Oral Given 07/01/14 2239)  FLUoxetine (PROZAC) capsule 20 mg (20 mg Oral Given 07/02/14 1011)  silver sulfADIAZINE (SILVADENE) 1 % cream ( Topical Given 07/02/14 1011)  HYDROcodone-acetaminophen (NORCO/VICODIN) 5-325 MG per tablet 1 tablet (not administered)  enoxaparin (LOVENOX) injection 40 mg (40 mg Subcutaneous Given 07/01/14 2238)  acetaminophen (TYLENOL) tablet 650 mg (650 mg Oral Given 07/02/14 1029)    Or  acetaminophen (TYLENOL) suppository 650 mg ( Rectal See Alternative 07/02/14 1029)  piperacillin-tazobactam (ZOSYN)  IVPB 3.375 g (3.375 g Intravenous Given 07/02/14 0831)  antiseptic oral rinse (BIOTENE) solution 15 mL (15 mLs Mouth Rinse Given 07/02/14 0831)  sodium chloride 0.9 % bolus 500 mL (500 mLs Intravenous New Bag/Given 07/01/14 1643)  acetaminophen (TYLENOL) tablet 650 mg (650 mg Oral Given 07/01/14 1849)  piperacillin-tazobactam (ZOSYN) IVPB 3.375 g (3.375 g Intravenous Given 07/02/14 0325)    Labs Review Labs Reviewed  URINALYSIS, ROUTINE W REFLEX MICROSCOPIC - Abnormal; Notable for the following:    Color, Urine RED (*)    APPearance TURBID (*)    Hgb urine dipstick LARGE (*)    Bilirubin Urine LARGE (*)    Ketones, ur 40 (*)    Protein, ur  100 (*)    Nitrite POSITIVE (*)    Leukocytes, UA LARGE (*)    All other components within normal limits  CBC WITH DIFFERENTIAL - Abnormal; Notable for the following:    WBC 12.0 (*)    RBC 4.06 (*)    Hemoglobin 11.6 (*)    HCT 34.1 (*)    Neutrophils Relative % 79 (*)    Neutro Abs 9.5 (*)    All other components within normal limits  COMPREHENSIVE METABOLIC PANEL - Abnormal; Notable for the following:    Sodium 127 (*)    Potassium 3.4 (*)    Chloride 86 (*)    Glucose, Bld 121 (*)    Calcium 8.1 (*)    Total Protein 5.9 (*)    Albumin 2.2 (*)    Alkaline Phosphatase 296 (*)    GFR calc non Af Amer 78 (*)    Anion gap 17 (*)    All other components within normal limits  PRO B NATRIURETIC PEPTIDE - Abnormal; Notable for the following:    Pro B Natriuretic peptide (BNP) 4603.0 (*)    All other components within normal limits  URINE MICROSCOPIC-ADD ON - Abnormal; Notable for the following:    Squamous Epithelial / LPF FEW (*)    All other components within normal limits  COMPREHENSIVE METABOLIC PANEL - Abnormal; Notable for the following:    Sodium 124 (*)    Chloride 89 (*)    Glucose, Bld 129 (*)    Calcium 8.0 (*)    Total Protein 5.9 (*)    Albumin 2.2 (*)    Alkaline Phosphatase 314 (*)    GFR calc non Af Amer 79 (*)    All  other components within normal limits  BASIC METABOLIC PANEL - Abnormal; Notable for the following:    Sodium 126 (*)    Potassium 3.4 (*)    Chloride 91 (*)    Glucose, Bld 140 (*)    Calcium 7.7 (*)    GFR calc non Af Amer 84 (*)    All other components within normal limits  BASIC METABOLIC PANEL - Abnormal; Notable for the following:    Sodium 126 (*)    Potassium 3.4 (*)    Chloride 91 (*)    Glucose, Bld 157 (*)    Calcium 7.6 (*)    GFR calc non Af Amer 85 (*)    All other components within normal limits  BASIC METABOLIC PANEL - Abnormal; Notable for the following:    Sodium 128 (*)    Potassium 3.4 (*)    Chloride 90 (*)    Glucose, Bld 106 (*)    Calcium 7.5 (*)    GFR calc non Af Amer 84 (*)    All other components within normal limits  CULTURE, BLOOD (ROUTINE X 2)  CULTURE, BLOOD (ROUTINE X 2)  I-STAT TROPOININ, ED    Imaging Review Dg Chest 2 View  07/01/2014   CLINICAL DATA:  Nausea, vomiting.  EXAM: CHEST  2 VIEW  COMPARISON:  06/24/2014  FINDINGS: Small bilateral effusions. Bibasilar opacities, favor atelectasis. Heart is normal size. No acute bony abnormality.  IMPRESSION: Small bilateral pleural effusions with bibasilar opacities, favor atelectasis.   Electronically Signed   By: Rolm Baptise M.D.   On: 07/01/2014 18:05     EKG Interpretation   Date/Time:  Monday July 01 2014 15:25:48 EDT Ventricular Rate:  116 PR Interval:  148 QRS Duration: 110 QT Interval:  338 QTC Calculation: 469 R Axis:   -33 Text Interpretation:  Sinus tachycardia with occasional Premature  ventricular complexes Tachy is new Left axis deviation Abnormal ECG  Confirmed by Konnar Ben  MD, Aniesa Boback ([id number]) on 07/01/2014 4:13:36 PM      MDM   Final diagnoses:  UTI (lower urinary tract infection)  Dementia, without behavioral disturbance  Hyponatremia        Dot Lanes, MD 07/02/14 1542

## 2014-07-01 NOTE — ED Notes (Signed)
Pt noted to have some mild twitching to left side; VS reassessed and EKG performed; pt to go to E44; pt alert to person and place; pt drooling

## 2014-07-02 ENCOUNTER — Encounter (HOSPITAL_COMMUNITY): Payer: Self-pay | Admitting: General Practice

## 2014-07-02 DIAGNOSIS — Z8546 Personal history of malignant neoplasm of prostate: Secondary | ICD-10-CM | POA: Diagnosis not present

## 2014-07-02 DIAGNOSIS — N390 Urinary tract infection, site not specified: Secondary | ICD-10-CM | POA: Diagnosis present

## 2014-07-02 DIAGNOSIS — Z7982 Long term (current) use of aspirin: Secondary | ICD-10-CM | POA: Diagnosis not present

## 2014-07-02 DIAGNOSIS — Z803 Family history of malignant neoplasm of breast: Secondary | ICD-10-CM | POA: Diagnosis not present

## 2014-07-02 DIAGNOSIS — I519 Heart disease, unspecified: Secondary | ICD-10-CM | POA: Diagnosis present

## 2014-07-02 DIAGNOSIS — I10 Essential (primary) hypertension: Secondary | ICD-10-CM | POA: Diagnosis present

## 2014-07-02 DIAGNOSIS — Z791 Long term (current) use of non-steroidal anti-inflammatories (NSAID): Secondary | ICD-10-CM | POA: Diagnosis not present

## 2014-07-02 DIAGNOSIS — E559 Vitamin D deficiency, unspecified: Secondary | ICD-10-CM | POA: Diagnosis present

## 2014-07-02 DIAGNOSIS — N4889 Other specified disorders of penis: Secondary | ICD-10-CM

## 2014-07-02 DIAGNOSIS — T83511A Infection and inflammatory reaction due to indwelling urethral catheter, initial encounter: Secondary | ICD-10-CM | POA: Diagnosis present

## 2014-07-02 DIAGNOSIS — I4949 Other premature depolarization: Secondary | ICD-10-CM | POA: Diagnosis present

## 2014-07-02 DIAGNOSIS — Z96659 Presence of unspecified artificial knee joint: Secondary | ICD-10-CM | POA: Diagnosis not present

## 2014-07-02 DIAGNOSIS — F0390 Unspecified dementia without behavioral disturbance: Secondary | ICD-10-CM | POA: Diagnosis present

## 2014-07-02 DIAGNOSIS — H409 Unspecified glaucoma: Secondary | ICD-10-CM | POA: Diagnosis present

## 2014-07-02 DIAGNOSIS — Z823 Family history of stroke: Secondary | ICD-10-CM | POA: Diagnosis not present

## 2014-07-02 DIAGNOSIS — Z66 Do not resuscitate: Secondary | ICD-10-CM | POA: Diagnosis present

## 2014-07-02 DIAGNOSIS — E871 Hypo-osmolality and hyponatremia: Secondary | ICD-10-CM | POA: Diagnosis present

## 2014-07-02 DIAGNOSIS — R627 Adult failure to thrive: Secondary | ICD-10-CM | POA: Diagnosis present

## 2014-07-02 DIAGNOSIS — E785 Hyperlipidemia, unspecified: Secondary | ICD-10-CM | POA: Diagnosis present

## 2014-07-02 DIAGNOSIS — Z888 Allergy status to other drugs, medicaments and biological substances status: Secondary | ICD-10-CM | POA: Diagnosis not present

## 2014-07-02 DIAGNOSIS — Z6827 Body mass index (BMI) 27.0-27.9, adult: Secondary | ICD-10-CM | POA: Diagnosis not present

## 2014-07-02 DIAGNOSIS — I428 Other cardiomyopathies: Secondary | ICD-10-CM

## 2014-07-02 DIAGNOSIS — Y846 Urinary catheterization as the cause of abnormal reaction of the patient, or of later complication, without mention of misadventure at the time of the procedure: Secondary | ICD-10-CM | POA: Diagnosis present

## 2014-07-02 DIAGNOSIS — M1990 Unspecified osteoarthritis, unspecified site: Secondary | ICD-10-CM | POA: Diagnosis present

## 2014-07-02 DIAGNOSIS — R7309 Other abnormal glucose: Secondary | ICD-10-CM | POA: Diagnosis present

## 2014-07-02 DIAGNOSIS — C7951 Secondary malignant neoplasm of bone: Secondary | ICD-10-CM | POA: Diagnosis present

## 2014-07-02 LAB — BASIC METABOLIC PANEL
Anion gap: 12 (ref 5–15)
Anion gap: 12 (ref 5–15)
Anion gap: 13 (ref 5–15)
BUN: 11 mg/dL (ref 6–23)
BUN: 12 mg/dL (ref 6–23)
BUN: 12 mg/dL (ref 6–23)
CALCIUM: 7.7 mg/dL — AB (ref 8.4–10.5)
CHLORIDE: 90 meq/L — AB (ref 96–112)
CO2: 23 mEq/L (ref 19–32)
CO2: 23 mEq/L (ref 19–32)
CO2: 25 meq/L (ref 19–32)
CREATININE: 0.62 mg/dL (ref 0.50–1.35)
Calcium: 7.6 mg/dL — ABNORMAL LOW (ref 8.4–10.5)
Calcium: 7.5 mg/dL — ABNORMAL LOW (ref 8.4–10.5)
Chloride: 91 mEq/L — ABNORMAL LOW (ref 96–112)
Chloride: 91 mEq/L — ABNORMAL LOW (ref 96–112)
Creatinine, Ser: 0.62 mg/dL (ref 0.50–1.35)
Creatinine, Ser: 0.6 mg/dL (ref 0.50–1.35)
GFR calc Af Amer: >90 mL/min (ref >90)
GFR calc non Af Amer: 85 mL/min — ABNORMAL LOW (ref >90)
GFR calc non Af Amer: 84 mL/min — ABNORMAL LOW (ref >90)
GFR, EST NON AFRICAN AMERICAN: 84 mL/min — AB (ref >90)
Glucose, Bld: 140 mg/dL — ABNORMAL HIGH (ref 70–99)
Glucose, Bld: 157 mg/dL — ABNORMAL HIGH (ref 70–99)
Glucose, Bld: 106 mg/dL — ABNORMAL HIGH (ref 70–99)
POTASSIUM: 3.4 meq/L — AB (ref 3.7–5.3)
Potassium: 3.4 mEq/L — ABNORMAL LOW (ref 3.7–5.3)
Potassium: 3.4 mEq/L — ABNORMAL LOW (ref 3.7–5.3)
SODIUM: 126 meq/L — AB (ref 137–147)
Sodium: 126 mEq/L — ABNORMAL LOW (ref 137–147)
Sodium: 128 mEq/L — ABNORMAL LOW (ref 137–147)

## 2014-07-02 MED ORDER — PIPERACILLIN-TAZOBACTAM 3.375 G IVPB
3.3750 g | Freq: Three times a day (TID) | INTRAVENOUS | Status: DC
  Administered 2014-07-02 – 2014-07-03 (×4): 3.375 g via INTRAVENOUS
  Filled 2014-07-02 (×7): qty 50

## 2014-07-02 MED ORDER — PIPERACILLIN-TAZOBACTAM 3.375 G IVPB 30 MIN
3.3750 g | Freq: Once | INTRAVENOUS | Status: AC
  Administered 2014-07-02: 3.375 g via INTRAVENOUS
  Filled 2014-07-02: qty 50

## 2014-07-02 MED ORDER — BIOTENE DRY MOUTH MT LIQD
15.0000 mL | Freq: Two times a day (BID) | OROMUCOSAL | Status: DC
  Administered 2014-07-02 – 2014-07-03 (×3): 15 mL via OROMUCOSAL

## 2014-07-02 NOTE — Progress Notes (Signed)
NURSING PROGRESS NOTE  Joshua Norton [id number] Admission Data: 07/02/2014 6:01 AM Attending Provider: Berle Mull, MD TRR:[id number],XYBFXOV DAVID, MD Code Status: DNR   Joshua Norton is a 80 y.o. male patient admitted from ED:  -No acute distress noted.  -No complaints of shortness of breath.  -No complaints of chest pain.   Cardiac Monitoring: Box # N/A in place. Cardiac monitor yields:N/A.  Blood pressure 120/71, pulse 72, temperature 97.8 F (36.6 C), temperature source Oral, resp. rate 18, height 5' 8.5" (1.74 m), weight 84.097 kg (185 lb 6.4 oz), SpO2 99.00%.   IV Fluids:  IV in place, occlusive dsg intact without redness, IV cath wrist left, condition patent and no redness none.   Allergies:  Doxazosin  Past Medical History:   has a past medical history of Edema of both legs (06/09/2012); HTN (hypertension), benign; Osteoarthritis (arthritis due to wear and tear of joints); Diastolic dysfunction; Cardiomyopathy; Prediabetes; Hyperlipidemia; Anemia; Vitamin D deficiency; Glaucoma; Dementia; and TIA (transient ischemic attack).  Past Surgical History:   has past surgical history that includes Total knee arthroplasty (Left, 09/2003); Transurethral resection of prostate; Cervical laminectomy; Tonsillectomy; Cataract extraction; and Lumbar spine surgery (1987).  Social History:   reports that he has never smoked. He has never used smokeless tobacco. He reports that he does not drink alcohol or use illicit drugs.  Skin: Stage 2 sacrum;DTI on left heel & stage 1 rt.heel  Patient/Family orientated to room. Information packet given to patient/family. Admission inpatient armband information verified with patient/family to include name and date of birth and placed on patient arm. Side rails up x 2, fall assessment and education completed with patient/family. Patient/family able to verbalize understanding of risk associated with falls and verbalized understanding to call for assistance before  getting out of bed. Call light within reach. Patient/family able to voice and demonstrate understanding of unit orientation instructions.    Will continue to evaluate and treat per MD orders.

## 2014-07-02 NOTE — Care Management Note (Signed)
    Page 1 of 1   07/03/2014     1:04:36 PM CARE MANAGEMENT NOTE 07/03/2014  Patient:  Joshua Norton, Joshua Norton   Account Number:  [id number]  Date Initiated:  07/02/2014  Documentation initiated by:  Tomi Bamberger  Subjective/Objective Assessment:   dx ffr, probable prosthetic with prob mets, ca,n/v,hyponatremia, n/v, ams  admit- lives with grand daughter Joshua Norton 19 33.     Action/Plan:   Home with Hospice.   Anticipated DC Date:  07/03/2014   Anticipated DC Plan:  [facility]  In-house referral  Clinical Social Worker      DC Planning Services  CM consult      [facility]   Choice offered to / List presented to:  C-4 Adult Children           Status of service:  Completed, signed off Medicare Important Message given?  NA - LOS <3 / Initial given by admissions (If response is "NO", the following Medicare IM given date fields will be blank) Date Medicare IM given:   Medicare IM given by:   Date Additional Medicare IM given:   Additional Medicare IM given by:    Discharge Disposition:  [facility]  Per UR Regulation:  Reviewed for med. necessity/level of care/duration of stay  If discussed at [place] of Stay Meetings, dates discussed:    Comments:  07/03/14 [place], BSN [phone] patient for dc home with [facility], Margie aware, grand daughter would like patient to be there after 2 pm by ambulance, CSW aware.  07/02/14 [place], BSN [phone] patient lives with granddaughter Joshua Norton , MD spoke with her and she has chosen home hospice for patient, NCM spoke with granddaughter and she chose [facility], referral made to [facility] with [place].  Patient has a hospital bed from [employer]).  Patient also has a w/chair at home.  Patient will need an ambualnce via transport. NCM was notified by physical therapy that grandson stated they also need a hoyer lift and an aide.

## 2014-07-02 NOTE — Progress Notes (Addendum)
Patient ID: [name], male   DOB: 10/09/1919, 80 y.o.   MRN: [id number]         PATIENT DETAILS Name: Joshua Norton Age: 80 y.o. Sex: male Date of Birth: 02-05-1919 Admit Date: 07/01/2014 Admitting Physician Berle Mull, MD LPF:[id number] DAVID, MD  Brief History: Patient is a 80 year-old man with a PMH of HTN, HF, cardiomyopathy, hyperlipidemia and prediabetes who was recently discharged s/p hyponatremia and ulcerated necrotic penis who was readmitted due to reoccurring hyponatremia, nausea, vomiting and UTI.   Subjective: Patient has no new complaints at this time. He denies nausea, vomiting, chest pain and shortness of breath. History and examination are difficult to obtain due to patient's sleepiness and dementia.   Assessment/Plan: Hyponatremia: -Appears to be resolving- sodium trending upward, most recent reading 128.  -Suspected etiology-is dehydration-from being out in the sun, and vomiting -Given IVF-now stopped-monitor off IVF, looks almost euvolemic. See if he has good oral intake-if not-restart maintenance IVF, follow electrolytes. -Regular Diet  UTI with hematuria-due to indwelling foley catheter -Continue Zosyn to treat urinary tract infection. Hematuria has resolved -Suspect complication of chronic indwelling foley and urethral meatus ulcer -follow cultures  Urethral Meatus Ulcer  - Appears to have worsened since previous discharge. - Continue to apply silver sulfadiazine 1% cream twice daily  - Consult urology for outpatient follow up-Dr Sloan Leiter spoke with grand-daughter over the phone-apparently was scheduled for urology follow up today, she will reschedule in a week  HTN, benign - Well controlled on current dose of Norvasc, most recent reading 120/71.  Congestive dilated cardiomyopathy with EF 40-45% -No lasix given recurrent hyponatremia -clinically compensated  Dementia -Stable, no delirium, continue Prozac -Upon discharge, patient needs  outpatient home hospice or SNF.   Ethics -DNR -spoke with Grand-daughter-suspect patient will continue to deteriorate, has failure to thrive syndrome, stage 4 malignancy-likely prostate. Will benefit from initiation of hospice services.Have asked case management to offer hospice choices. Family electing at this time to take pt home with hospice, rather than SNF.  Disposition: -Remain inpatient. Upon discharge, patient needs outpatient home hospice or SNF.   DVT Prophylaxis: Prophylactic Lovenox   Code Status: DNR   Family Communication Grand-daughter over the phone   Procedures:  None at this time  CONSULTS:  urology  Time spent 40 minutes-which includes 50% of the time with face-to-face with patient/ family and coordinating care related to the above assessment and plan.  MEDICATIONS: Scheduled Meds: . amLODipine  2.5 mg Oral Daily  . antiseptic oral rinse  15 mL Mouth Rinse BID  . aspirin EC  325 mg Oral QHS  . brinzolamide  1 drop Both Eyes BID  . enoxaparin (LOVENOX) injection  40 mg Subcutaneous Q24H  . finasteride  5 mg Oral QHS  . FLUoxetine  20 mg Oral Daily  . pantoprazole  20 mg Oral QHS  . piperacillin-tazobactam (ZOSYN)  IV  3.375 g Intravenous Q8H  . silver sulfADIAZINE   Topical BID   Continuous Infusions:  PRN Meds:.acetaminophen, acetaminophen, HYDROcodone-acetaminophen  Antibiotics: Anti-infectives   Start     Dose/Rate Route Frequency Ordered Stop   07/02/14 0800  piperacillin-tazobactam (ZOSYN) IVPB 3.375 g     3.375 g 12.5 mL/hr over 240 Minutes Intravenous Every 8 hours 07/02/14 0220     07/02/14 0230  piperacillin-tazobactam (ZOSYN) IVPB 3.375 g     3.375 g 100 mL/hr over 30 Minutes Intravenous  Once 07/02/14 0220 07/02/14 0355   07/01/14 2100  vancomycin (VANCOCIN) 1,500 mg in sodium  chloride 0.9 % 500 mL IVPB  Status:  Discontinued     1,500 mg 250 mL/hr over 120 Minutes Intravenous Every 24 hours 07/01/14 2023 07/02/14 0925        PHYSICAL EXAM: Vital signs in last 24 hours: Filed Vitals:   07/01/14 2030 07/01/14 2105 07/01/14 2349 07/02/14 0507  BP: 119/58 122/66 120/60 120/71  Pulse:  92  72  Temp:  97.7 F (36.5 C)  97.8 F (36.6 C)  TempSrc:  Oral  Oral  Resp: $Remo'20 15  18  'KsoPI$ Height:      Weight:    84.097 kg (185 lb 6.4 oz)  SpO2:  100%  99%    Weight change:  Filed Weights   07/01/14 2010 07/02/14 0507  Weight: 84 kg (185 lb 3 oz) 84.097 kg (185 lb 6.4 oz)   Body mass index is 27.78 kg/(m^2).   Gen Exam: Awakens when spoken to, but difficult to converse with. Speech is understandable but not articulate.   Neck: Supple, No JVD.   Chest: B/L Clear.   CVS: S1 S2 Regular, no murmurs.  Abdomen: soft, BS +, non tender, non distended.  Extremities: mild edema, lower extremities warm to touch. Neurologic: Non Focal.   Skin: No Rash.   Wounds: ulceration at glans of penis.   Intake/Output from previous day:  Intake/Output Summary (Last 24 hours) at 07/02/14 1033 Last data filed at 07/02/14 0932  Gross per 24 hour  Intake    120 ml  Output   1075 ml  Net   -955 ml     LAB RESULTS: CBC  Recent Labs Lab 06/26/14 0642 07/01/14 1556  WBC 14.0* 12.0*  HGB 9.9* 11.6*  HCT 28.7* 34.1*  PLT 358 400  MCV 84.2 84.0  MCH 29.0 28.6  MCHC 34.5 34.0  RDW 15.7* 15.4  LYMPHSABS  --  1.6  MONOABS  --  0.9  EOSABS  --  0.0  BASOSABS  --  0.0    Chemistries   Recent Labs Lab 07/01/14 1556 07/01/14 2027 07/02/14 0022 07/02/14 0133 07/02/14 0445  NA 127* 124* 126* 126* 128*  K 3.4* 3.9 3.4* 3.4* 3.4*  CL 86* 89* 91* 91* 90*  CO2 $Re'24 24 23 23 25  'eWD$ GLUCOSE 121* 129* 140* 157* 106*  BUN $Re'13 12 12 12 11  'jRk$ CREATININE 0.74 0.72 0.62 0.60 0.62  CALCIUM 8.1* 8.0* 7.7* 7.6* 7.5*    CBG: No results found for this basename: GLUCAP,  in the last 168 hours  GFR Estimated Creatinine Clearance: 62.8 ml/min (by C-G formula based on Cr of 0.62).  Coagulation profile No results found for this  basename: INR, PROTIME,  in the last 168 hours  Cardiac Enzymes No results found for this basename: CK, CKMB, TROPONINI, MYOGLOBIN,  in the last 168 hours  No components found with this basename: POCBNP,  No results found for this basename: DDIMER,  in the last 72 hours No results found for this basename: HGBA1C,  in the last 72 hours No results found for this basename: CHOL, HDL, LDLCALC, TRIG, CHOLHDL, LDLDIRECT,  in the last 72 hours No results found for this basename: TSH, T4TOTAL, FREET3, T3FREE, THYROIDAB,  in the last 72 hours No results found for this basename: VITAMINB12, FOLATE, FERRITIN, TIBC, IRON, RETICCTPCT,  in the last 72 hours No results found for this basename: LIPASE, AMYLASE,  in the last 72 hours  Urine Studies No results found for this basename: UACOL, UAPR, USPG, UPH, UTP, UGL,  UKET, UBIL, UHGB, UNIT, UROB, ULEU, UEPI, UWBC, URBC, UBAC, CAST, CRYS, UCOM, BILUA,  in the last 72 hours  MICROBIOLOGY: Recent Results (from the past 240 hour(s))  URINE CULTURE     Status: None   Collection Time    06/22/14 10:52 PM      Result Value Ref Range Status   Specimen Description URINE, RANDOM   Final   Special Requests NONE   Final   Culture  Setup Time     Final   Value: 06/23/2014 16:55     Performed at [place] [facility]     Final   Value: >=100,000 COLONIES/ML     Performed at [facility]   Culture     Final   Value: Multiple bacterial morphotypes present, none predominant. Suggest appropriate recollection if clinically indicated.     Performed at [facility]   Report Status 06/25/2014 FINAL   Final  MRSA PCR SCREENING     Status: None   Collection Time    06/23/14  3:01 PM      Result Value Ref Range Status   MRSA by PCR NEGATIVE  NEGATIVE Final   Comment:            The GeneXpert MRSA Assay (FDA     approved for NASAL specimens     only), is one component of a     comprehensive MRSA colonization     surveillance program.  It is not     intended to diagnose MRSA     infection nor to guide or     monitor treatment for     MRSA infections.    RADIOLOGY STUDIES/RESULTS: Dg Chest 2 View  07/01/2014   CLINICAL DATA:  Nausea, vomiting.  EXAM: CHEST  2 VIEW  COMPARISON:  06/24/2014  FINDINGS: Small bilateral effusions. Bibasilar opacities, favor atelectasis. Heart is normal size. No acute bony abnormality.  IMPRESSION: Small bilateral pleural effusions with bibasilar opacities, favor atelectasis.   Electronically Signed   By: Rolm Baptise M.D.   On: 07/01/2014 18:05   Dg Chest 2 View  06/22/2014   CLINICAL DATA:  Shoulder pain.  Cardiomyopathy.  EXAM: CHEST  2 VIEW  COMPARISON:  05/16/2014  FINDINGS: Stable cardiomegaly and mediastinal contours. Lateral view limited by the patient's arm thickening rays. Pulmonary vascularity is normal. The lungs are normally expanded and clear. No airspace disease or pleural effusion. Negative for pneumothorax. 3 mm radiopaque foreign body projects over the soft tissues of the left neck on the frontal view. No acute osseous abnormality identified. Please note that the majority of the right shoulder is excluded from this image.  IMPRESSION: Stable mild cardiomegaly.  No acute findings in the chest.   Electronically Signed   By: Curlene Dolphin M.D.   On: 06/22/2014 22:01   Dg Shoulder Right  06/22/2014   CLINICAL DATA:  Right shoulder pain and limited range of motion after a fall.  EXAM: RIGHT SHOULDER - 2+ VIEW  COMPARISON:  03/23/2014  FINDINGS: Degenerative changes in the glenohumeral and acromioclavicular joints. Decreased subacromial space consistent with chronic rotator cuff arthropathy. No evidence of acute fracture or dislocation of the right shoulder.  IMPRESSION: Degenerative changes in the right shoulder with probable chronic rotator cuff arthropathy. No acute bony abnormalities identified 3   Electronically Signed   By: [name] M.D.   On: 06/22/2014 22:02   Ct Head Wo  Contrast  06/22/2014   CLINICAL  DATA:  Neck pain after a fall.  EXAM: CT HEAD WITHOUT CONTRAST  CT CERVICAL SPINE WITHOUT CONTRAST  TECHNIQUE: Multidetector CT imaging of the head and cervical spine was performed following the standard protocol without intravenous contrast. Multiplanar CT image reconstructions of the cervical spine were also generated.  COMPARISON:  None.  FINDINGS: CT HEAD FINDINGS  Prominent diffuse cerebral atrophy. Ventricular dilatation is likely due to central atrophy. Low-attenuation changes in the deep white matter consistent with small vessel ischemia. No mass effect or midline shift. No abnormal extra-axial fluid collections. Gray-white matter junctions are distinct. Basal cisterns are not effaced. No evidence of acute intracranial hemorrhage. No depressed skull fractures. Visualized paranasal sinuses and mastoid air cells are not opacified.  CT CERVICAL SPINE FINDINGS  Straightening of the usual cervical lordosis which may be due to patient positioning but ligamentous injury or muscle spasm can also have this appearance. There is diffuse degenerative change throughout the cervical spine with diffuse narrowing of cervical interspaces and associated endplate hypertrophic changes. Prominent disc osteophyte complexes at C3-4, C4-5, C5-6, and C6-7 levels. C1-2 articulation appears intact. There is diffuse heterogeneous sclerosis and lucency demonstrated throughout the cervical spine and in the visualized upper thoracic vertebra. This is likely to indicate diffuse bone metastasis. Renal osteodystrophy, metabolic abnormalities, or severe osteoporosis could also potentially have this appearance. Correlation with PSA is recommended.  IMPRESSION: No acute intracranial abnormalities. Chronic atrophy and small vessel ischemic changes.  Nonspecific straightening of the usual cervical lordosis. Diffuse degenerative change throughout the cervical spine. Heterogeneous lytic and sclerotic changes  throughout the visualized spine worrisome for bone metastasis.   Electronically Signed   By: [name] M.D.   On: 06/22/2014 22:19   Ct Cervical Spine Wo Contrast  06/22/2014   CLINICAL DATA:  Neck pain after a fall.  EXAM: CT HEAD WITHOUT CONTRAST  CT CERVICAL SPINE WITHOUT CONTRAST  TECHNIQUE: Multidetector CT imaging of the head and cervical spine was performed following the standard protocol without intravenous contrast. Multiplanar CT image reconstructions of the cervical spine were also generated.  COMPARISON:  None.  FINDINGS: CT HEAD FINDINGS  Prominent diffuse cerebral atrophy. Ventricular dilatation is likely due to central atrophy. Low-attenuation changes in the deep white matter consistent with small vessel ischemia. No mass effect or midline shift. No abnormal extra-axial fluid collections. Gray-white matter junctions are distinct. Basal cisterns are not effaced. No evidence of acute intracranial hemorrhage. No depressed skull fractures. Visualized paranasal sinuses and mastoid air cells are not opacified.  CT CERVICAL SPINE FINDINGS  Straightening of the usual cervical lordosis which may be due to patient positioning but ligamentous injury or muscle spasm can also have this appearance. There is diffuse degenerative change throughout the cervical spine with diffuse narrowing of cervical interspaces and associated endplate hypertrophic changes. Prominent disc osteophyte complexes at C3-4, C4-5, C5-6, and C6-7 levels. C1-2 articulation appears intact. There is diffuse heterogeneous sclerosis and lucency demonstrated throughout the cervical spine and in the visualized upper thoracic vertebra. This is likely to indicate diffuse bone metastasis. Renal osteodystrophy, metabolic abnormalities, or severe osteoporosis could also potentially have this appearance. Correlation with PSA is recommended.  IMPRESSION: No acute intracranial abnormalities. Chronic atrophy and small vessel ischemic changes.   Nonspecific straightening of the usual cervical lordosis. Diffuse degenerative change throughout the cervical spine. Heterogeneous lytic and sclerotic changes throughout the visualized spine worrisome for bone metastasis.   Electronically Signed   By: [name] M.D.   On: 06/22/2014 22:19  Dg Chest Port 1 View  06/24/2014   CLINICAL DATA:  Cardiomyopathy  EXAM: PORTABLE CHEST - 1 VIEW  COMPARISON:  06/22/2014  FINDINGS: Cardiac shadow is again mildly enlarged. The lungs are well aerated bilaterally. No focal confluent infiltrate is seen.  IMPRESSION: Given some technical variations in the image, no significant interval change is noted.   Electronically Signed   By: Inez Catalina M.D.   On: 06/24/2014 07:50   Dg Bone Survey Met  06/25/2014   CLINICAL DATA:  Lytic lesions in cervical spine  EXAM: METASTATIC BONE SURVEY  COMPARISON:  CT cervical spine 06/22/2014  FINDINGS: Osseous demineralization.  Prominent RIGHT hilum.  Lytic lesions are identified in the C4 and C5 vertebral bodies, better visualized on prior CT.  Inferior cervical spine is obscured by the patient shoulders, better visualized on CT.  No definite calvarial lucencies.  Multilevel degenerative disc and facet disease changes cervical spine.  Patchy areas attenuation identified to thoracic and lumbar vertebrae, suspect areas of sclerosis and lucency at multiple levels, most prominent being sclerosis on LEFT at T7, L1, and L5.  Thoracic and lumbar vertebral body heights appear maintained.  Sclerosis at medial aspect of posterior LEFT tenth rib.  Patchy areas of sclerosis in pelvis.  LEFT knee prosthesis noted.  No focal long bone sclerosis or lytic lesions identified.  IMPRESSION: Lytic foci within the C4 and C5 vertebral bodies, could be seen with multiple myeloma or lytic metastases.  Numerous sclerotic foci identified within thoracic and lumbar vertebrae, posterior LEFT tenth rib, and throughout pelvis highly suspicious for sclerotic  metastases such as from prostate cancer; correlation with digital rectal exam and PSA recommended.  It is difficult to it exclude additional lytic foci within the thoracolumbar spine versus areas of normal mineralization adjacent to sclerotic lesions.  Osseous demineralization without definite long bone abnormalities.   Electronically Signed   By: Lavonia Dana M.D.   On: 06/25/2014 18:55   Dg Humerus Right  06/22/2014   CLINICAL DATA:  Right shoulder pain and limited range of motion after a fall.  EXAM: RIGHT HUMERUS - 2+ VIEW  COMPARISON:  Right shoulder 03/23/2014  FINDINGS: Degenerative changes in the right shoulder. No evidence of acute fracture or dislocation of the humerus. Soft tissues are unremarkable.  IMPRESSION: No acute bony abnormalities.   Electronically Signed   By: [name] M.D.   On: 06/22/2014 22:11    Audelia Acton, PA-S  [facility] Pager:[phone]  If 7PM-7AM, please contact night-coverage [email or web address] Password [email or web address] 07/02/2014, 10:33 AM   LOS: 1 day   **Disclaimer: This note may have been dictated with voice recognition software. Similar sounding words can inadvertently be transcribed and this note may contain transcription errors which may not have been corrected upon publication of note.**  Attending Patient was seen, examined,treatment plan was discussed with the Physician extender. I have directly reviewed the clinical findings, lab, imaging studies and management of this patient in detail. I have made the necessary changes to the above noted documentation, and agree with the documentation, as recorded by the Physician extender.  Nena Alexander MD [facility].

## 2014-07-02 NOTE — Evaluation (Signed)
Physical Therapy Evaluation Patient Details Name: Joshua Norton MRN: [id number] DOB: 1919/08/18 Today's Date: 07/02/2014   History of Present Illness  Dylen Mcelhannon is a 80 y.o. male with Past medical history of diastolic dysfunction, hypertension, possible prostate cancer with cervical lytic lesions, penile ulcer with necrosis, chronic indwelling Foley, dementia.Admitted from home with N/V , hematuria and confusion  Clinical Impression  Pt pleasant but very limited ability to recall PLOF, assist with mobility or follow commands. Pt currently max-total assist of 1-2 people for just sit<>supine which per chart and grandson is a continued decline since around July 4. Family would benefit from SNF for continued therapy and assist with ADLs however family currently refusing and requesting home with Hospice. Pt will benefit of acute trial of therapy to attempt to maximize strength and mobility to improve quality of life and decrease caregiver burden .  Recommend frequent in bed mobility with nursing and maximove for OOb.     Follow Up Recommendations SNF;Supervision/Assistance - 24 hour (pt reports they will deny SNF and take pt home)    Equipment Recommendations  Other (comment) (hoyer lift, HHaide)    Recommendations for Other Services       Precautions / Restrictions Precautions Precautions: Fall Precaution Comments: stage III wound left heel Restrictions Weight Bearing Restrictions: No      Mobility  Bed Mobility Overal bed mobility: Needs Assistance;+2 for physical assistance Bed Mobility: Supine to Sit;Sit to Supine     Supine to sit: Max assist;HOB elevated Sit to supine: Total assist   General bed mobility comments: max assist to pivot legs to EOB and elevate trunk. pt with significant posterior lean, not tolerating assist at shoulder for upright trunk and unable to maintain upright without assist. Total assist to pivot pt back to bed and 2 person assist to scoot to [facility]. Max VC  for all techniques and sequence. EOB only 3 min before pt unable to tolerate further  Transfers                    Ambulation/Gait                Stairs            Wheelchair Mobility    Modified Rankin (Stroke Patients Only)       Balance Overall balance assessment: Needs assistance   Sitting balance-Leahy Scale: Zero                                       Pertinent Vitals/Pain Pt reports pain with all movement  PAINAD= 4    Home Living Family/patient expects to be discharged to:: Private residence   Available Help at Discharge: Family;Available 24 hours/day Type of Home: House Home Access: Ramped entrance     Home Layout: One level Home Equipment: Hospital bed;Wheelchair - Insurance claims handler - 2 wheels;Toilet riser;Bedside commode Additional Comments: PLOF per grandson via phone as pt not able to provide clear PLOF    Prior Function Level of Independence: Needs assistance   Gait / Transfers Assistance Needed: non-amb since 7/4, per grandson at [facility] prior to 7/11 was transferre[facility] to [facility] on his own and walking limited distance, pt fell and has not transferred on his own since then. pt home since last admission 3 days and not OOB in that time  ADL's / Homemaking Assistance Needed: total assist for homemaking, meals, max assist bathing  and dressing, pt able to self - feed  Comments: FAmily intends for pt to return home as they were not please with prior SNF stay     Hand Dominance        Extremity/Trunk Assessment   Upper Extremity Assessment: Generalized weakness RUE Deficits / Details: did not fully assess due to lack of participation and pt stating pain right shoulder with all touch         Lower Extremity Assessment: Generalized weakness;RLE deficits/detail RLE Deficits / Details: max assist to mobilize legs with tigh hip adductors and limited knee flexion grossl 40degrees and unable to further  assess due to pt report of pain       Communication   Communication: HOH  Cognition Arousal/Alertness: Awake/alert Behavior During Therapy: Flat affect Overall Cognitive Status: Impaired/Different from baseline Area of Impairment: Orientation;Memory;Safety/judgement Orientation Level: Time   Memory: Decreased short-term memory              General Comments      Exercises        Assessment/Plan    PT Assessment Patient needs continued PT services  PT Diagnosis Generalized weakness   PT Problem List Decreased strength;Decreased cognition;Decreased range of motion;Decreased activity tolerance;Decreased balance;Decreased safety awareness;Decreased skin integrity;Pain;Decreased mobility  PT Treatment Interventions Functional mobility training;Therapeutic activities;Therapeutic exercise;Patient/family education;Balance training   PT Goals (Current goals can be found in the Care Plan section) Acute Rehab PT Goals Patient Stated Goal: none stated PT Goal Formulation: With family Time For Goal Achievement: 07/16/14 Potential to Achieve Goals: Fair    Frequency Min 2X/week   Barriers to discharge        Co-evaluation               End of Session   Activity Tolerance: Patient limited by fatigue Patient left: in bed;with call bell/phone within reach;with bed alarm set;with nursing/sitter in room Nurse Communication: Mobility status;Need for lift equipment    Functional Assessment Tool Used: clinical judgement Functional Limitation: Mobility: Walking and moving around Mobility: Walking and Moving Around Current Status [phone]): At least 80 percent but less than 100 percent impaired, limited or restricted Mobility: Walking and Moving Around Goal Status [phone]): At least 60 percent but less than 80 percent impaired, limited or restricted    Time: 1054-1103 PT Time Calculation (min): 9 min   Charges:   PT Evaluation $Initial PT Evaluation Tier I: 1 Procedure PT  Treatments $Therapeutic Activity: 8-22 mins   PT G Codes:   Functional Assessment Tool Used: clinical judgement Functional Limitation: Mobility: Walking and moving around    [name] 07/02/2014, 11:24 AM Elwyn Reach, [place]

## 2014-07-02 NOTE — Progress Notes (Signed)
80yo male to broaden ABX for cellulitis.  Will start Zosyn 3.375g IV Q8H for CrCl ~60 ml/min and monitor CBC and Cx.  Wynona Neat, PharmD, BCPS 07/02/2014 2:21 AM

## 2014-07-03 NOTE — Clinical Social Work Note (Signed)
CSW has left several messages for patient's granddaughter and grandson in attempts to inform them that patient would DC today and to inform that ambulance transport will be set up for patient. RNCM informed CSW that patient will be requiring ambulance transport home at discharge. CSW has not received call back from family. CSW has placed DC packet on patient's chart with ambulance transport number.   Liz Beach MSW, [place], [place], [phone]

## 2014-07-03 NOTE — Clinical Documentation Improvement (Signed)
  Nursing Progress note dated 07/01/14 at 8:30 pm ([place], RN) notes the following ulcer sites present on admission:   - Stage 2 Sacrum   - Deep Tissue Injury Left Heel   - Stage 1 Right Heel  No WOC nurse consult, yet this admission.   Please document the presence of the ulcers, including TYPE, STAGE, LOCATION and PRESENT ON ADMISSION in the progress notes and discharge summary.   Thank You, Erling Conte ,RN BSN CCDS Certified Clinical Documentation Specialist:  [facility]

## 2014-07-03 NOTE — Progress Notes (Signed)
Lonell Face discharged Home with family per MD order.  Discharge instructions reviewed and discussed with the patient granddaughter over the phone,  all questions and concerns answered. Copy of instructions and care note for new diagnosis sent with patient.    Medication List         amLODipine 2.5 MG tablet  Commonly known as:  NORVASC  Take 2.5 mg by mouth daily.     aspirin EC 325 MG tablet  Take 325 mg by mouth at bedtime.     bimatoprost 0.01 % Soln  Commonly known as:  LUMIGAN  Place 1 drop into both eyes at bedtime.     brinzolamide 1 % ophthalmic suspension  Commonly known as:  AZOPT  Place 1 drop into both eyes 2 (two) times daily.     fexofenadine 180 MG tablet  Commonly known as:  ALLEGRA  Take 180 mg by mouth at bedtime.     finasteride 5 MG tablet  Commonly known as:  PROSCAR  Take 5 mg by mouth at bedtime.     FLUoxetine 20 MG capsule  Commonly known as:  PROZAC  Take 20 mg by mouth daily.     GLUCOSAMINE-CHONDROITIN-MSM PO  Take 1 tablet by mouth at bedtime. Glucosamine 1500 mg/ chrondroiton 1200 mg     HYDROcodone-acetaminophen 5-325 MG per tablet  Commonly known as:  NORCO/VICODIN  Take 1 tablet by mouth every 6 (six) hours as needed for moderate pain.     multivitamin with minerals Tabs tablet  Take 1 tablet by mouth at bedtime.     pantoprazole 20 MG tablet  Commonly known as:  PROTONIX  Take 20 mg by mouth at bedtime.     silver sulfADIAZINE 1 % cream  Commonly known as:  SILVADENE  Apply topically 2 (two) times daily. Apply to penis twice daily     vitamin B-12 1000 MCG tablet  Commonly known as:  CYANOCOBALAMIN  Take 1,000 mcg by mouth at bedtime.     Vitamin D 2000 UNITS tablet  Take 2,000 Units by mouth at bedtime.        Patients skin is clean, dry and intact, with DTI to left heel, stage 1 to right heel which is blanchable and stage 2 to sacrum.  Informed granddaughter of skin problems which pt. Had PTA, all dressings changed at  discharge. IV site discontinued and catheter remains intact. Site without signs and symptoms of complications. Dressing and pressure applied.  Patient will be transferred home by [facility],  no distress noted upon discharge.  Wynetta Emery, Kaemon Barnett C 07/03/2014 4:15 PM

## 2014-07-03 NOTE — Clinical Social Work Note (Signed)
CSW confirmed address with granddaughter and has requested ambulance transport. CSW signing off at this time.  Liz Beach MSW, [place], [place], [phone]

## 2014-07-03 NOTE — Plan of Care (Signed)
Problem: Phase II Progression Outcomes Goal: Obtain order to discontinue catheter if appropriate Outcome: Not Met (add Reason) Pt. Has chronic foley  Problem: Phase III Progression Outcomes Goal: Voiding independently Outcome: Not Met (add Reason) Pt. Has chronic foley Goal: Foley discontinued Outcome: Not Met (add Reason) Pt. Has chronic foley

## 2014-07-03 NOTE — Discharge Summary (Signed)
Physician Discharge Summary  Joshua Norton NFA:[id number] DOB: 04-29-1919 DOA: 07/01/2014  PCP: Nadean Corwin, MD  Admit date: 07/01/2014 Discharge date: 07/03/2014  Time spent: 35 minutes  Recommendations for Outpatient Follow-up:  1. Follow up with Urology as scheduled 2. Follow up with PCP in 1-2 weeks  Discharge Diagnoses:  Principal Problem:   Hyponatremia Active Problems:   HTN (hypertension), benign   Congestive dilated cardiomyopathy- EF 40-45% 2D 6/13   PVC's (premature ventricular contractions)   UTI (lower urinary tract infection)   Dementia   Penile ulcer   Discharge Condition: Improved  Diet recommendation: Regular  Filed Weights   07/01/14 2010 07/02/14 0507  Weight: 185 lb 3 oz (84 kg) 185 lb 6.4 oz (84.097 kg)    History of present illness:  See h and p from 7/20 for details. Briefly, the patient was recently discharged for hyponatremia.  Hospital Course:  Hyponatremia:  -Appears to be resolving- sodium trending upward, most recent reading 128.  -Suspected etiology-is dehydration-from being out in the sun, and vomiting  -Initially given IVF-now stopped  -Regular Diet  UTI with hematuria-due to indwelling foley catheter  -Continue Zosyn to treat urinary tract infection. Hematuria has resolved  -Suspect complication of chronic indwelling foley and urethral meatus ulcer  -follow cultures  Urethral Meatus Ulcer  - Appears stable with no active drainage - Continue to apply silver sulfadiazine 1% cream twice daily  - Discussed with Dr. Vernie Ammons regarding management of penile ulcer. Urology recommendations were for no further antibiotics and for close outpatient follow up. HTN, benign  - Well controlled on current dose of Norvasc, most recent reading 120/71.  Congestive dilated cardiomyopathy with EF 40-45%  -No lasix given recurrent hyponatremia  -clinically compensated  Dementia  -Stable, no delirium, continue Prozac  -Upon discharge, patient  needs outpatient home hospice or SNF.  Ethics  -DNR  -spoke with Grand-daughter-suspect patient will continue to deteriorate, has failure to thrive syndrome, stage 4 malignancy-likely prostate. Will benefit from initiation of hospice services.Have asked case management to offer hospice choices. Family electing at this time to take pt home with hospice, rather than SNF.  Consultations:  Urology over phone  Discharge Exam: Filed Vitals:   07/02/14 1346 07/02/14 2026 07/03/14 0605 07/03/14 1000  BP: 117/77 152/80 128/71 106/61  Pulse: 84 93 71   Temp: 98.3 F (36.8 C) 97.7 F (36.5 C) 97.4 F (36.3 C)   TempSrc: Oral Oral Oral   Resp: 18 18 18    Height:      Weight:      SpO2: 99% 93% 100%     General: Awake, in nad Cardiovascular: regular, s1, s2 Respiratory: normal resp effort, no wheezing  Discharge Instructions     Medication List         amLODipine 2.5 MG tablet  Commonly known as:  NORVASC  Take 2.5 mg by mouth daily.     aspirin EC 325 MG tablet  Take 325 mg by mouth at bedtime.     bimatoprost 0.01 % Soln  Commonly known as:  LUMIGAN  Place 1 drop into both eyes at bedtime.     brinzolamide 1 % ophthalmic suspension  Commonly known as:  AZOPT  Place 1 drop into both eyes 2 (two) times daily.     fexofenadine 180 MG tablet  Commonly known as:  ALLEGRA  Take 180 mg by mouth at bedtime.     finasteride 5 MG tablet  Commonly known as:  PROSCAR  Take 5 mg  by mouth at bedtime.     FLUoxetine 20 MG capsule  Commonly known as:  PROZAC  Take 20 mg by mouth daily.     GLUCOSAMINE-CHONDROITIN-MSM PO  Take 1 tablet by mouth at bedtime. Glucosamine 1500 mg/ chrondroiton 1200 mg     HYDROcodone-acetaminophen 5-325 MG per tablet  Commonly known as:  NORCO/VICODIN  Take 1 tablet by mouth every 6 (six) hours as needed for moderate pain.     multivitamin with minerals Tabs tablet  Take 1 tablet by mouth at bedtime.     pantoprazole 20 MG tablet  Commonly  known as:  PROTONIX  Take 20 mg by mouth at bedtime.     silver sulfADIAZINE 1 % cream  Commonly known as:  SILVADENE  Apply topically 2 (two) times daily. Apply to penis twice daily     vitamin B-12 1000 MCG tablet  Commonly known as:  CYANOCOBALAMIN  Take 1,000 mcg by mouth at bedtime.     Vitamin D 2000 UNITS tablet  Take 2,000 Units by mouth at bedtime.       Allergies  Allergen Reactions  . Doxazosin Swelling   Follow-up Information   Follow up with MCKEOWN,WILLIAM DAVID, MD. Schedule an appointment as soon as possible for a visit in 1 week.   Specialty:  Internal Medicine   Contact information:   [address] [place] [address] [phone]       Schedule an appointment as soon as possible for a visit with Garnett Farm, MD.   Specialty:  Urology   Contact information:   [address] [place] [address] [phone]        The results of significant diagnostics from this hospitalization (including imaging, microbiology, ancillary and laboratory) are listed below for reference.    Significant Diagnostic Studies: Dg Chest 2 View  07/01/2014   CLINICAL DATA:  Nausea, vomiting.  EXAM: CHEST  2 VIEW  COMPARISON:  06/24/2014  FINDINGS: Small bilateral effusions. Bibasilar opacities, favor atelectasis. Heart is normal size. No acute bony abnormality.  IMPRESSION: Small bilateral pleural effusions with bibasilar opacities, favor atelectasis.   Electronically Signed   By: Charlett Nose M.D.   On: 07/01/2014 18:05   Dg Chest 2 View  06/22/2014   CLINICAL DATA:  Shoulder pain.  Cardiomyopathy.  EXAM: CHEST  2 VIEW  COMPARISON:  05/16/2014  FINDINGS: Stable cardiomegaly and mediastinal contours. Lateral view limited by the patient's arm thickening rays. Pulmonary vascularity is normal. The lungs are normally expanded and clear. No airspace disease or pleural effusion. Negative for pneumothorax. 3 mm radiopaque foreign body projects over the soft tissues  of the left neck on the frontal view. No acute osseous abnormality identified. Please note that the majority of the right shoulder is excluded from this image.  IMPRESSION: Stable mild cardiomegaly.  No acute findings in the chest.   Electronically Signed   By: Britta Mccreedy M.D.   On: 06/22/2014 22:01   Dg Shoulder Right  06/22/2014   CLINICAL DATA:  Right shoulder pain and limited range of motion after a fall.  EXAM: RIGHT SHOULDER - 2+ VIEW  COMPARISON:  03/23/2014  FINDINGS: Degenerative changes in the glenohumeral and acromioclavicular joints. Decreased subacromial space consistent with chronic rotator cuff arthropathy. No evidence of acute fracture or dislocation of the right shoulder.  IMPRESSION: Degenerative changes in the right shoulder with probable chronic rotator cuff arthropathy. No acute bony abnormalities identified 3   Electronically Signed   By:  Burman Nieves M.D.   On: 06/22/2014 22:02   Ct Head Wo Contrast  06/22/2014   CLINICAL DATA:  Neck pain after a fall.  EXAM: CT HEAD WITHOUT CONTRAST  CT CERVICAL SPINE WITHOUT CONTRAST  TECHNIQUE: Multidetector CT imaging of the head and cervical spine was performed following the standard protocol without intravenous contrast. Multiplanar CT image reconstructions of the cervical spine were also generated.  COMPARISON:  None.  FINDINGS: CT HEAD FINDINGS  Prominent diffuse cerebral atrophy. Ventricular dilatation is likely due to central atrophy. Low-attenuation changes in the deep white matter consistent with small vessel ischemia. No mass effect or midline shift. No abnormal extra-axial fluid collections. Gray-white matter junctions are distinct. Basal cisterns are not effaced. No evidence of acute intracranial hemorrhage. No depressed skull fractures. Visualized paranasal sinuses and mastoid air cells are not opacified.  CT CERVICAL SPINE FINDINGS  Straightening of the usual cervical lordosis which may be due to patient positioning but  ligamentous injury or muscle spasm can also have this appearance. There is diffuse degenerative change throughout the cervical spine with diffuse narrowing of cervical interspaces and associated endplate hypertrophic changes. Prominent disc osteophyte complexes at C3-4, C4-5, C5-6, and C6-7 levels. C1-2 articulation appears intact. There is diffuse heterogeneous sclerosis and lucency demonstrated throughout the cervical spine and in the visualized upper thoracic vertebra. This is likely to indicate diffuse bone metastasis. Renal osteodystrophy, metabolic abnormalities, or severe osteoporosis could also potentially have this appearance. Correlation with PSA is recommended.  IMPRESSION: No acute intracranial abnormalities. Chronic atrophy and small vessel ischemic changes.  Nonspecific straightening of the usual cervical lordosis. Diffuse degenerative change throughout the cervical spine. Heterogeneous lytic and sclerotic changes throughout the visualized spine worrisome for bone metastasis.   Electronically Signed   By: Burman Nieves M.D.   On: 06/22/2014 22:19   Ct Cervical Spine Wo Contrast  06/22/2014   CLINICAL DATA:  Neck pain after a fall.  EXAM: CT HEAD WITHOUT CONTRAST  CT CERVICAL SPINE WITHOUT CONTRAST  TECHNIQUE: Multidetector CT imaging of the head and cervical spine was performed following the standard protocol without intravenous contrast. Multiplanar CT image reconstructions of the cervical spine were also generated.  COMPARISON:  None.  FINDINGS: CT HEAD FINDINGS  Prominent diffuse cerebral atrophy. Ventricular dilatation is likely due to central atrophy. Low-attenuation changes in the deep white matter consistent with small vessel ischemia. No mass effect or midline shift. No abnormal extra-axial fluid collections. Gray-white matter junctions are distinct. Basal cisterns are not effaced. No evidence of acute intracranial hemorrhage. No depressed skull fractures. Visualized paranasal sinuses and  mastoid air cells are not opacified.  CT CERVICAL SPINE FINDINGS  Straightening of the usual cervical lordosis which may be due to patient positioning but ligamentous injury or muscle spasm can also have this appearance. There is diffuse degenerative change throughout the cervical spine with diffuse narrowing of cervical interspaces and associated endplate hypertrophic changes. Prominent disc osteophyte complexes at C3-4, C4-5, C5-6, and C6-7 levels. C1-2 articulation appears intact. There is diffuse heterogeneous sclerosis and lucency demonstrated throughout the cervical spine and in the visualized upper thoracic vertebra. This is likely to indicate diffuse bone metastasis. Renal osteodystrophy, metabolic abnormalities, or severe osteoporosis could also potentially have this appearance. Correlation with PSA is recommended.  IMPRESSION: No acute intracranial abnormalities. Chronic atrophy and small vessel ischemic changes.  Nonspecific straightening of the usual cervical lordosis. Diffuse degenerative change throughout the cervical spine. Heterogeneous lytic and sclerotic changes throughout the visualized spine worrisome for  bone metastasis.   Electronically Signed   By: Burman Nieves M.D.   On: 06/22/2014 22:19   Dg Chest Port 1 View  06/24/2014   CLINICAL DATA:  Cardiomyopathy  EXAM: PORTABLE CHEST - 1 VIEW  COMPARISON:  06/22/2014  FINDINGS: Cardiac shadow is again mildly enlarged. The lungs are well aerated bilaterally. No focal confluent infiltrate is seen.  IMPRESSION: Given some technical variations in the image, no significant interval change is noted.   Electronically Signed   By: Alcide Clever M.D.   On: 06/24/2014 07:50   Dg Bone Survey Met  06/25/2014   CLINICAL DATA:  Lytic lesions in cervical spine  EXAM: METASTATIC BONE SURVEY  COMPARISON:  CT cervical spine 06/22/2014  FINDINGS: Osseous demineralization.  Prominent RIGHT hilum.  Lytic lesions are identified in the C4 and C5 vertebral bodies,  better visualized on prior CT.  Inferior cervical spine is obscured by the patient shoulders, better visualized on CT.  No definite calvarial lucencies.  Multilevel degenerative disc and facet disease changes cervical spine.  Patchy areas attenuation identified to thoracic and lumbar vertebrae, suspect areas of sclerosis and lucency at multiple levels, most prominent being sclerosis on LEFT at T7, L1, and L5.  Thoracic and lumbar vertebral body heights appear maintained.  Sclerosis at medial aspect of posterior LEFT tenth rib.  Patchy areas of sclerosis in pelvis.  LEFT knee prosthesis noted.  No focal long bone sclerosis or lytic lesions identified.  IMPRESSION: Lytic foci within the C4 and C5 vertebral bodies, could be seen with multiple myeloma or lytic metastases.  Numerous sclerotic foci identified within thoracic and lumbar vertebrae, posterior LEFT tenth rib, and throughout pelvis highly suspicious for sclerotic metastases such as from prostate cancer; correlation with digital rectal exam and PSA recommended.  It is difficult to it exclude additional lytic foci within the thoracolumbar spine versus areas of normal mineralization adjacent to sclerotic lesions.  Osseous demineralization without definite long bone abnormalities.   Electronically Signed   By: Ulyses Southward M.D.   On: 06/25/2014 18:55   Dg Humerus Right  06/22/2014   CLINICAL DATA:  Right shoulder pain and limited range of motion after a fall.  EXAM: RIGHT HUMERUS - 2+ VIEW  COMPARISON:  Right shoulder 03/23/2014  FINDINGS: Degenerative changes in the right shoulder. No evidence of acute fracture or dislocation of the humerus. Soft tissues are unremarkable.  IMPRESSION: No acute bony abnormalities.   Electronically Signed   By: Burman Nieves M.D.   On: 06/22/2014 22:11    Microbiology: Recent Results (from the past 240 hour(s))  MRSA PCR SCREENING     Status: None   Collection Time    06/23/14  3:01 PM      Result Value Ref Range  Status   MRSA by PCR NEGATIVE  NEGATIVE Final   Comment:            The GeneXpert MRSA Assay (FDA     approved for NASAL specimens     only), is one component of a     comprehensive MRSA colonization     surveillance program. It is not     intended to diagnose MRSA     infection nor to guide or     monitor treatment for     MRSA infections.  CULTURE, BLOOD (ROUTINE X 2)     Status: None   Collection Time    07/01/14  7:11 PM      Result Value Ref Range  Status   Specimen Description BLOOD HAND RIGHT   Final   Special Requests BOTTLES DRAWN AEROBIC ONLY 3CC   Final   Culture  Setup Time     Final   Value: 07/02/2014 00:24     Performed at [facility]   Culture     Final   Value:        BLOOD CULTURE RECEIVED NO GROWTH TO DATE CULTURE WILL BE HELD FOR 5 DAYS BEFORE ISSUING A FINAL NEGATIVE REPORT     Performed at [facility]   Report Status PENDING   Incomplete  CULTURE, BLOOD (ROUTINE X 2)     Status: None   Collection Time    07/01/14  7:16 PM      Result Value Ref Range Status   Specimen Description BLOOD ARM LEFT   Final   Special Requests BOTTLES DRAWN AEROBIC AND ANAEROBIC 7CC   Final   Culture  Setup Time     Final   Value: 07/02/2014 00:25     Performed at [facility]   Culture     Final   Value:        BLOOD CULTURE RECEIVED NO GROWTH TO DATE CULTURE WILL BE HELD FOR 5 DAYS BEFORE ISSUING A FINAL NEGATIVE REPORT     Performed at [facility]   Report Status PENDING   Incomplete     Labs: Basic Metabolic Panel:  Recent Labs Lab 07/01/14 1556 07/01/14 2027 07/02/14 0022 07/02/14 0133 07/02/14 0445  NA 127* 124* 126* 126* 128*  K 3.4* 3.9 3.4* 3.4* 3.4*  CL 86* 89* 91* 91* 90*  CO2 24 24 23 23 25   GLUCOSE 121* 129* 140* 157* 106*  BUN 13 12 12 12 11   CREATININE 0.74 0.72 0.62 0.60 0.62  CALCIUM 8.1* 8.0* 7.7* 7.6* 7.5*   Liver Function Tests:  Recent Labs Lab 07/01/14 1556 07/01/14 2027  AST 24 22  ALT 17 17   ALKPHOS 296* 314*  BILITOT 0.7 0.7  PROT 5.9* 5.9*  ALBUMIN 2.2* 2.2*   No results found for this basename: LIPASE, AMYLASE,  in the last 168 hours No results found for this basename: AMMONIA,  in the last 168 hours CBC:  Recent Labs Lab 07/01/14 1556  WBC 12.0*  NEUTROABS 9.5*  HGB 11.6*  HCT 34.1*  MCV 84.0  PLT 400   Cardiac Enzymes: No results found for this basename: CKTOTAL, CKMB, CKMBINDEX, TROPONINI,  in the last 168 hours BNP: BNP (last 3 results)  Recent Labs  07/01/14 1556  PROBNP 4603.0*   CBG: No results found for this basename: GLUCAP,  in the last 168 hours  Signed:  Madeline Pho K  [facility] 07/03/2014, 12:01 PM

## 2014-07-03 NOTE — Progress Notes (Addendum)
Notified by Ardelle Lesches, patient and family request services of [facility]) after discharge.  Patient information reviewed with Dr Suszanne Conners and hospice eligibility confirmed. -Spoke late yesterday afternoon and again this morning with granddaughter Orley Lawry [phone] to  initiate education related to hospice services, philosophy and team approach to care; she voiced good understanding of information provided. [name] indicated she understands her grandfather has metastatic disease and it could be prostate cancer; she stated that she does not want to put her grandfather through any aggressive treatments that 'she feels it is in God's hands'; she stated she has been caring for her Jon Gills 'for years' as all his children are out of state; she wants him to be as comfortable as possible and to have as many 'good days' as he can at home. She voiced appreciation for hospice being involved in his care and feels this is the best option at this time.  She requests Foley catheter be left in at discharge for comfort. Pt seen at bedside yesterday, awake, aware he was in the hospital able to tell writer he lived with his granddaughter and agreeable to Probation officer contacting her. Per discussion with granddaughter and primary team plan is to d/c home today via non-emergent transport. Please send completed GOLD DNR form home with patient.  DME needs discussed with granddaughter- she informs pt currently has hospital bed, BSC, w/c, through [place], she is agreeable to switching out equipment 'if needed', however, she would like to wait until [facility] assessment RN sees pt as she has paperwork that she feels indicates she owns this equipment  Initial paperwork faxed to [facility]  Please notify [facility] when patient is ready to leave unit at d/c call [phone] (or [phone] if after 5 pm);  [facility] information and contact numbers also given to granddaughter [name] during  phone discussion this morning.   Above information shared with Neoma Laming [facility] Please call with any questions or concerns   Danton Sewer, RN 07/03/2014, 10:10 AM [facility] Liaison [phone]

## 2014-07-03 NOTE — Clinical Social Work Note (Signed)
Patient/family has chosen home with hospice for patient. CSW will assist with EMS transport when appropriate.  Liz Beach MSW, [place], [place], [phone]

## 2014-07-08 LAB — CULTURE, BLOOD (ROUTINE X 2)
CULTURE: NO GROWTH
Culture: NO GROWTH

## 2014-07-23 ENCOUNTER — Other Ambulatory Visit: Payer: Self-pay | Admitting: Internal Medicine

## 2014-07-23 ENCOUNTER — Other Ambulatory Visit: Payer: Self-pay

## 2014-07-23 MED ORDER — FUROSEMIDE 20 MG PO TABS: 20.0000 mg | ORAL_TABLET | Freq: Every day | ORAL | Status: AC

## 2014-07-23 MED ORDER — METOLAZONE 2.5 MG PO TABS: 2.5000 mg | ORAL_TABLET | Freq: Every day | ORAL | Status: AC

## 2014-07-23 MED ORDER — FINASTERIDE 5 MG PO TABS: 5.0000 mg | ORAL_TABLET | Freq: Every day | ORAL | Status: AC

## 2014-08-13 DEATH — deceased

## 2014-11-22 ENCOUNTER — Encounter: Payer: Self-pay | Admitting: Internal Medicine

## 2015-09-23 IMAGING — CR DG SHOULDER 2+V*R*
2 series · 2 of 2 positions shown · non-contrast
Comparison: None.

CLINICAL DATA: Shoulder pain

EXAM:
RIGHT SHOULDER - 2+ VIEW

[w shoulder ap internal righ]
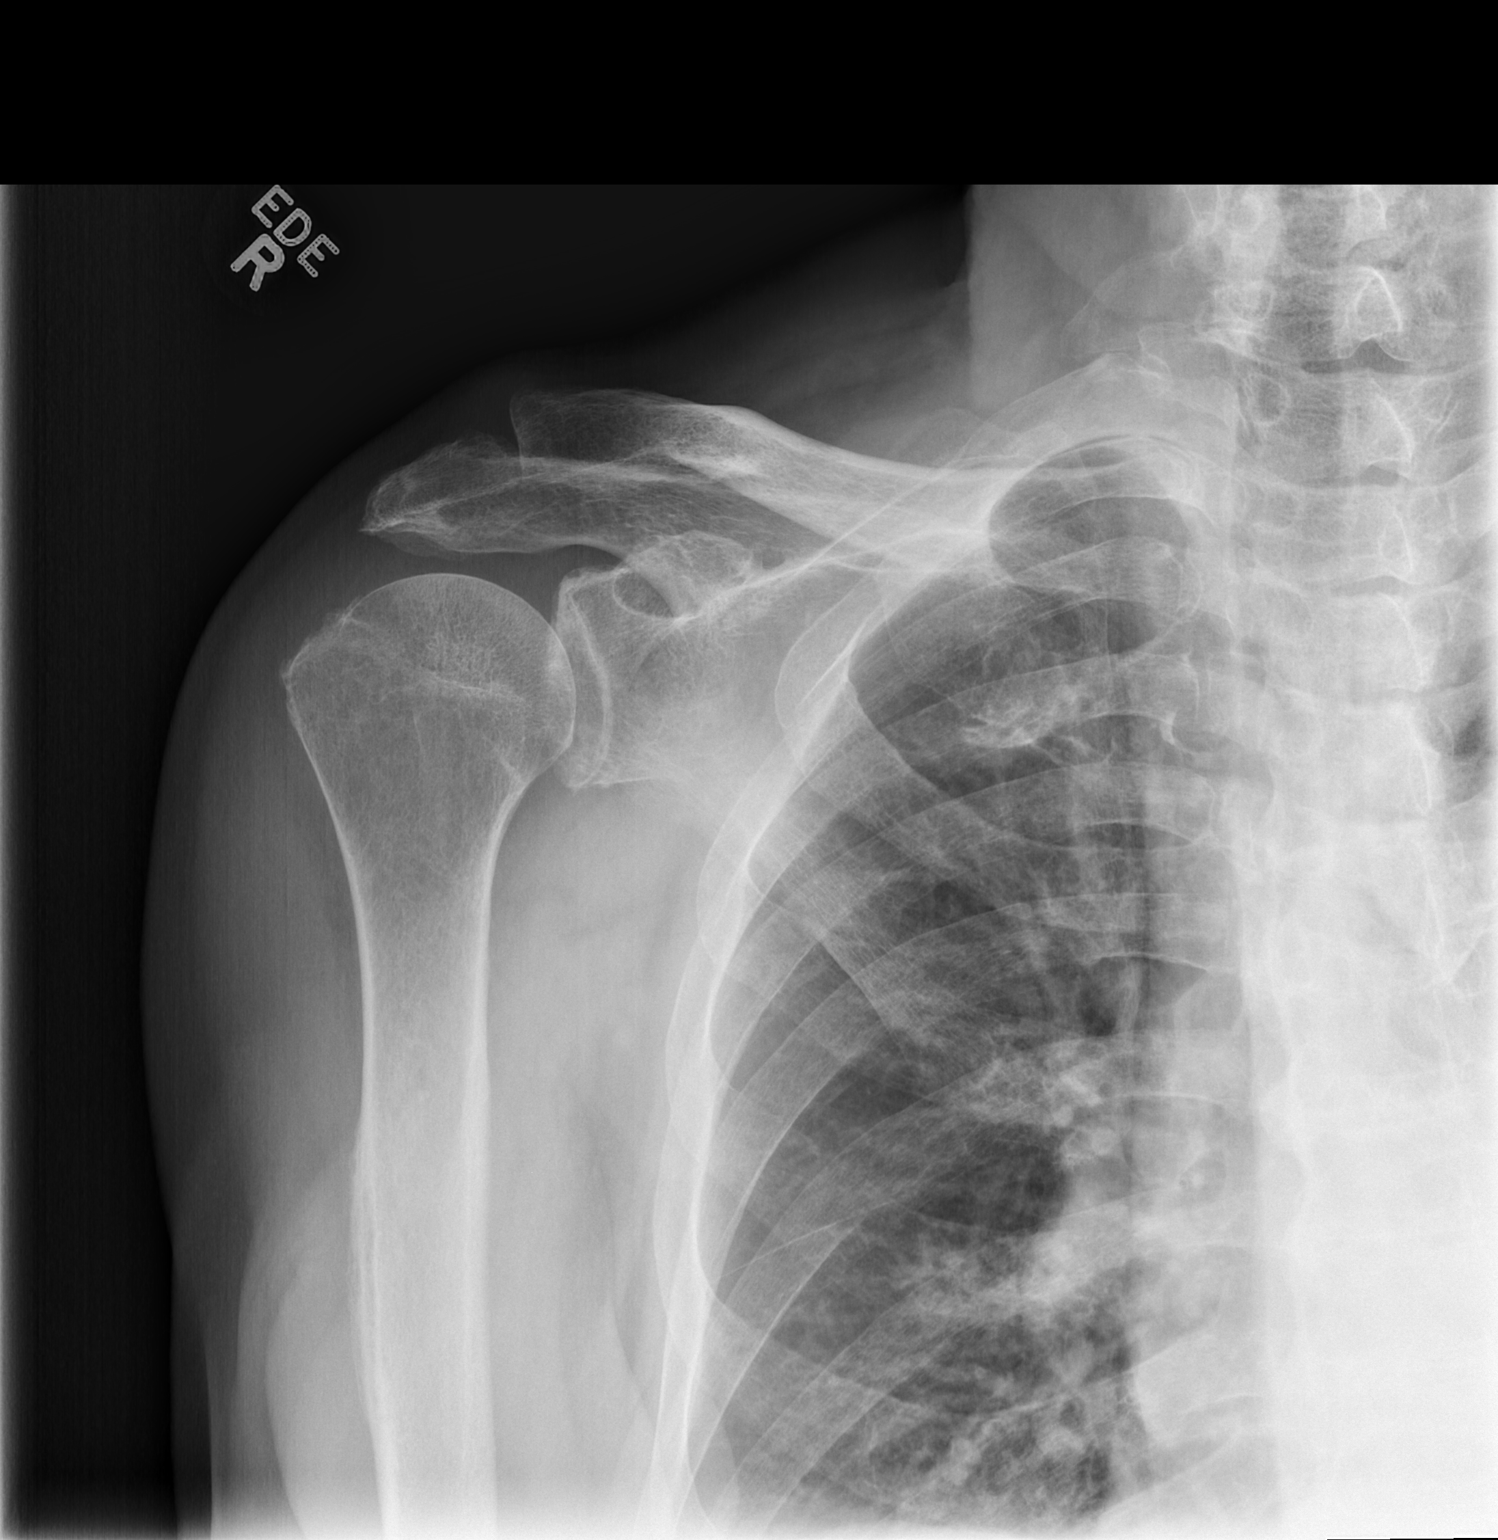

[w shoulder y view right]
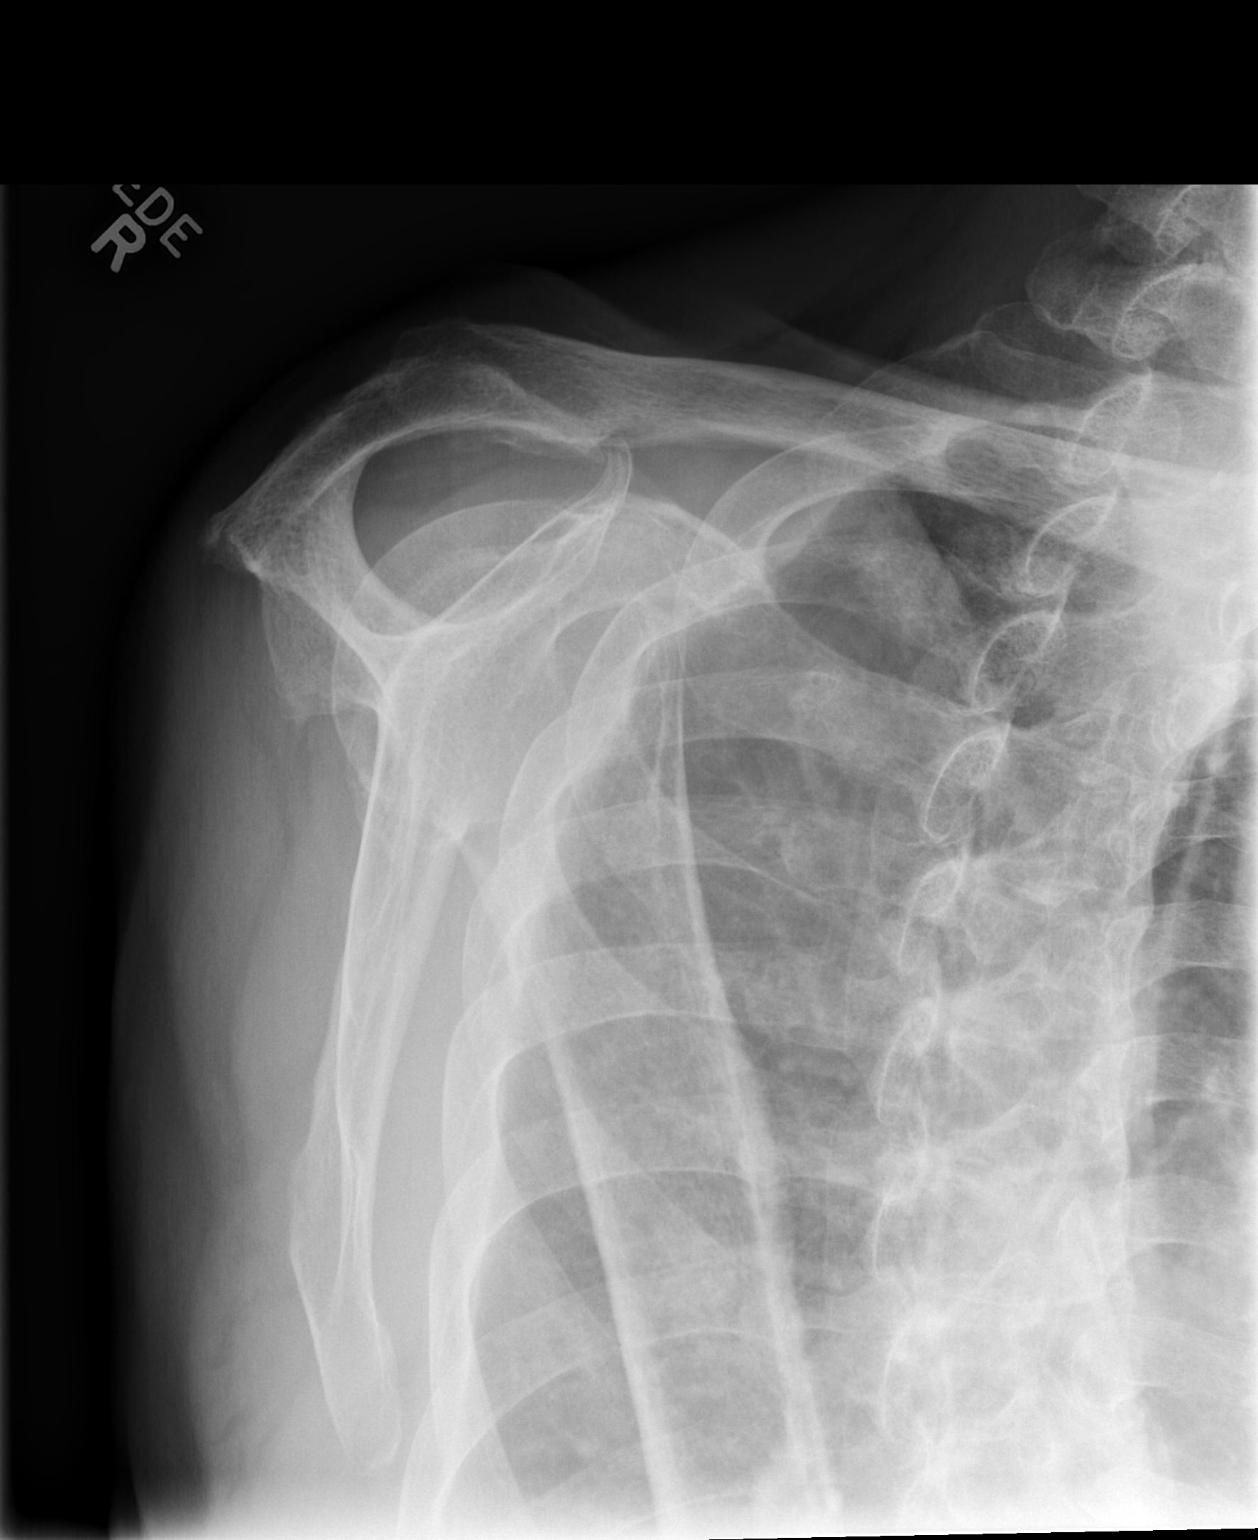

[2 of 2 positions shown; findings below may reference images not displayed]

FINDINGS: Two views of the right shoulder submitted. No acute fracture or
subluxation. Mild spurring of acromion.
IMPRESSION: No acute fracture or subluxation.  Mild degenerative changes.

## 2016-01-26 IMAGING — CR DG TIBIA/FIBULA 2V*R*
4 series · 4 of 4 positions shown · non-contrast
Comparison: None.

CLINICAL DATA: Right tib-fib pain

EXAM:
RIGHT TIBIA AND FIBULA - 2 VIEW

[x tib-fib ap right (1 of 2)]
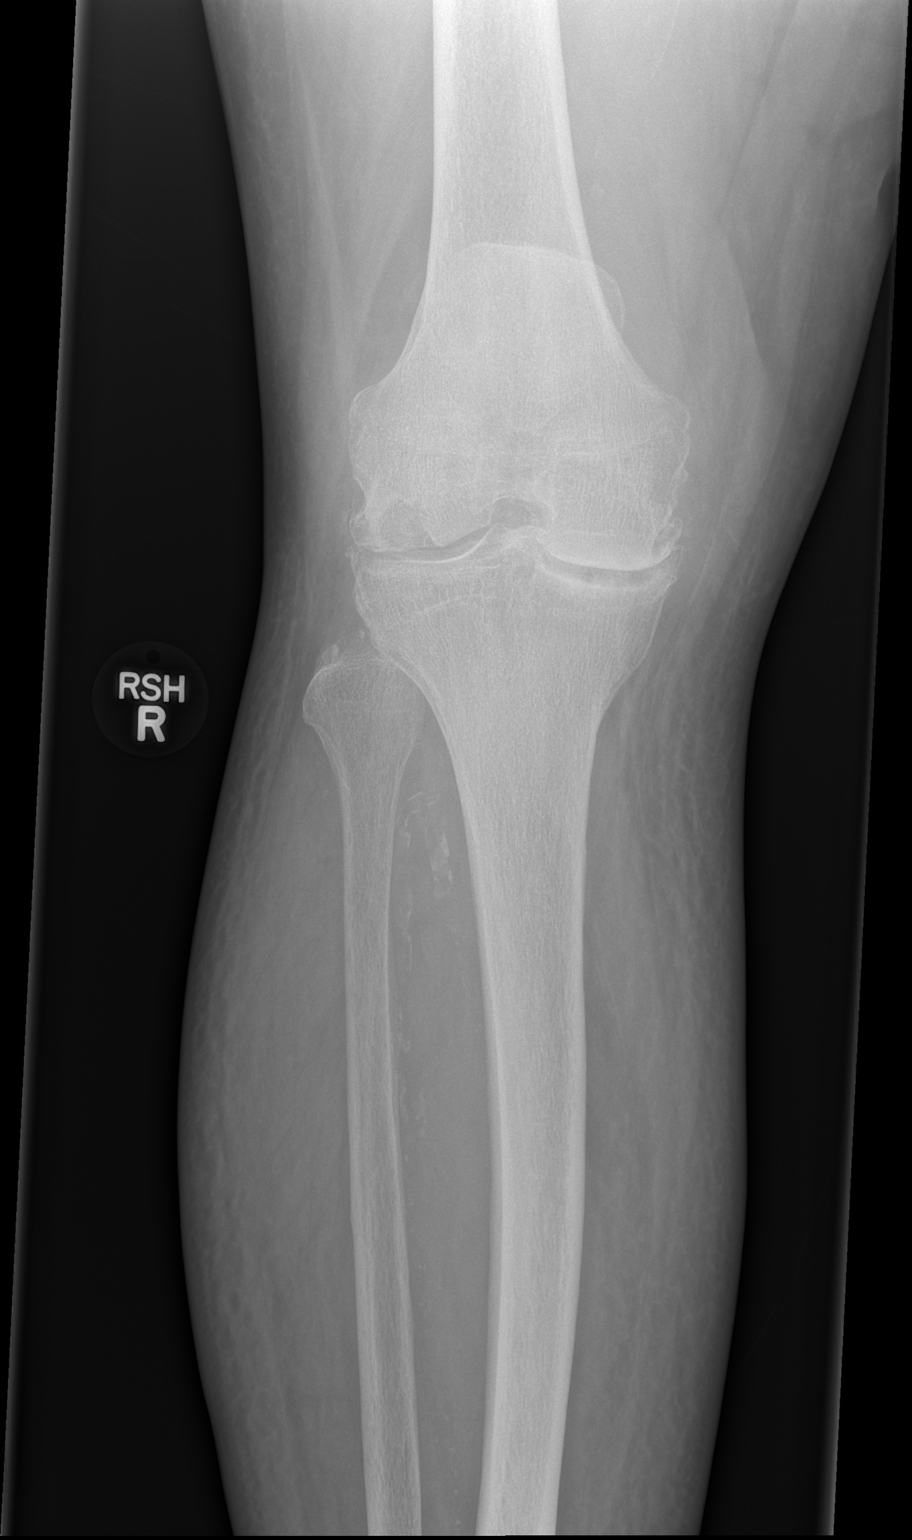

[x tib-fib ap right (2 of 2)]
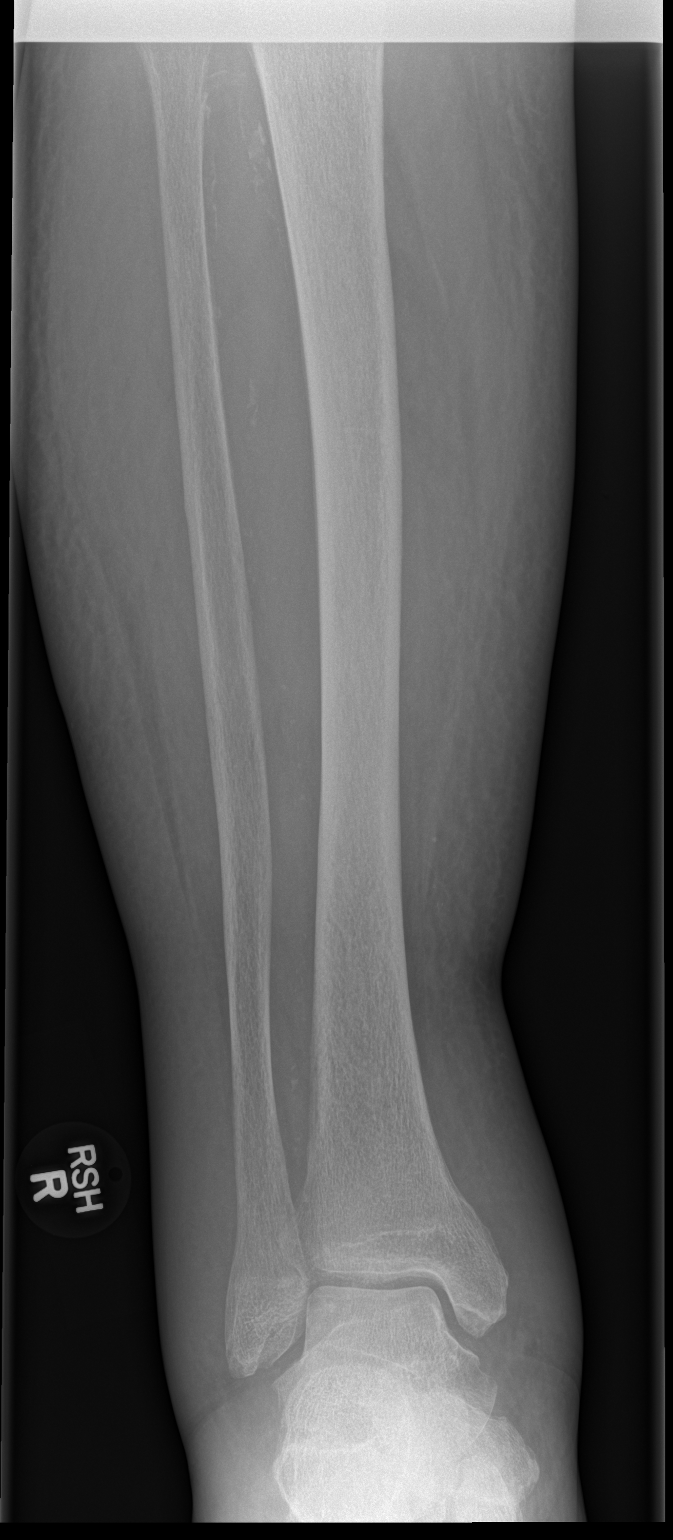

[x tib-fib lat right (1 of 2)]
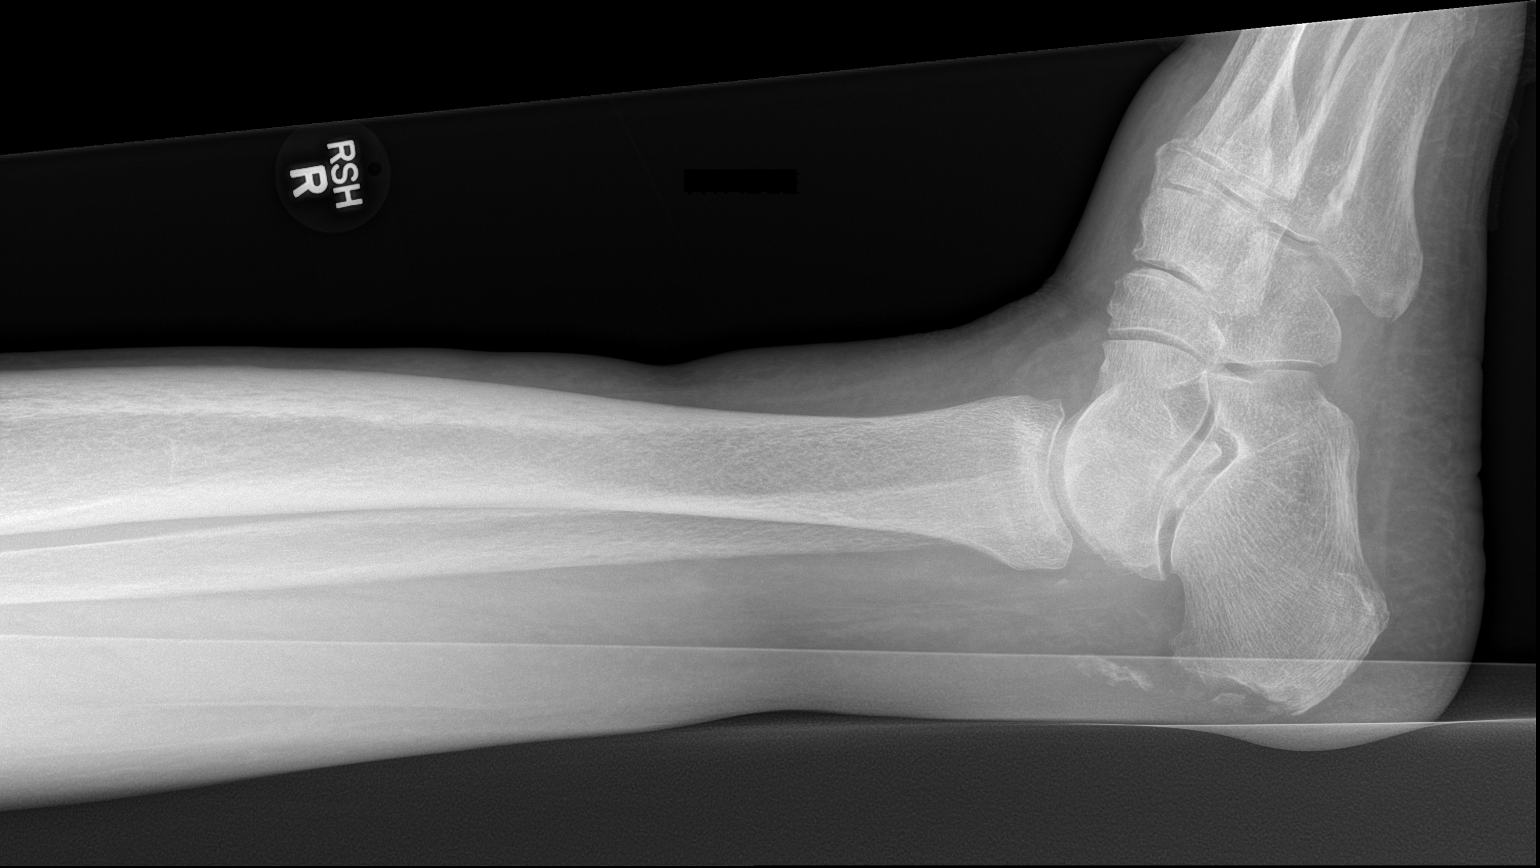

[x tib-fib lat right (2 of 2)]
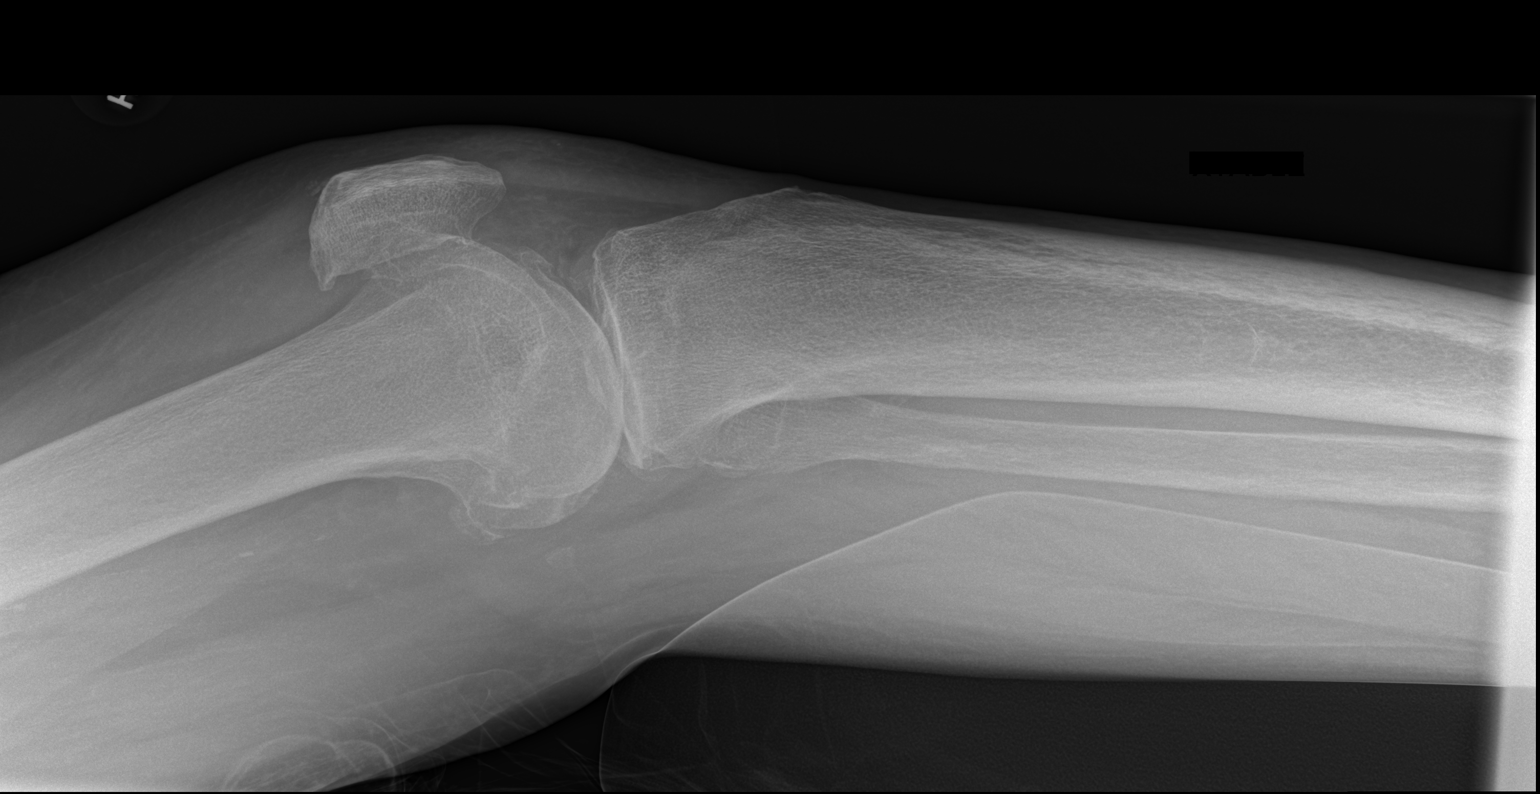

[4 of 4 positions shown; findings below may reference images not displayed]

FINDINGS: Moderate severe osteoarthritis involves the right knee. No acute
fracture or subluxation identified. There is mild diffuse soft
tissue swelling noted.
IMPRESSION: 1. No acute findings.
2. Osteoarthritis involves the right knee.

## 2016-03-19 IMAGING — CR DG CHEST 1V PORT
1 series · 1 of 1 positions shown · non-contrast
Comparison: Portable chest dated 03/23/2014.

CLINICAL DATA: weakness

EXAM:
PORTABLE CHEST - 1 VIEW

[AP]
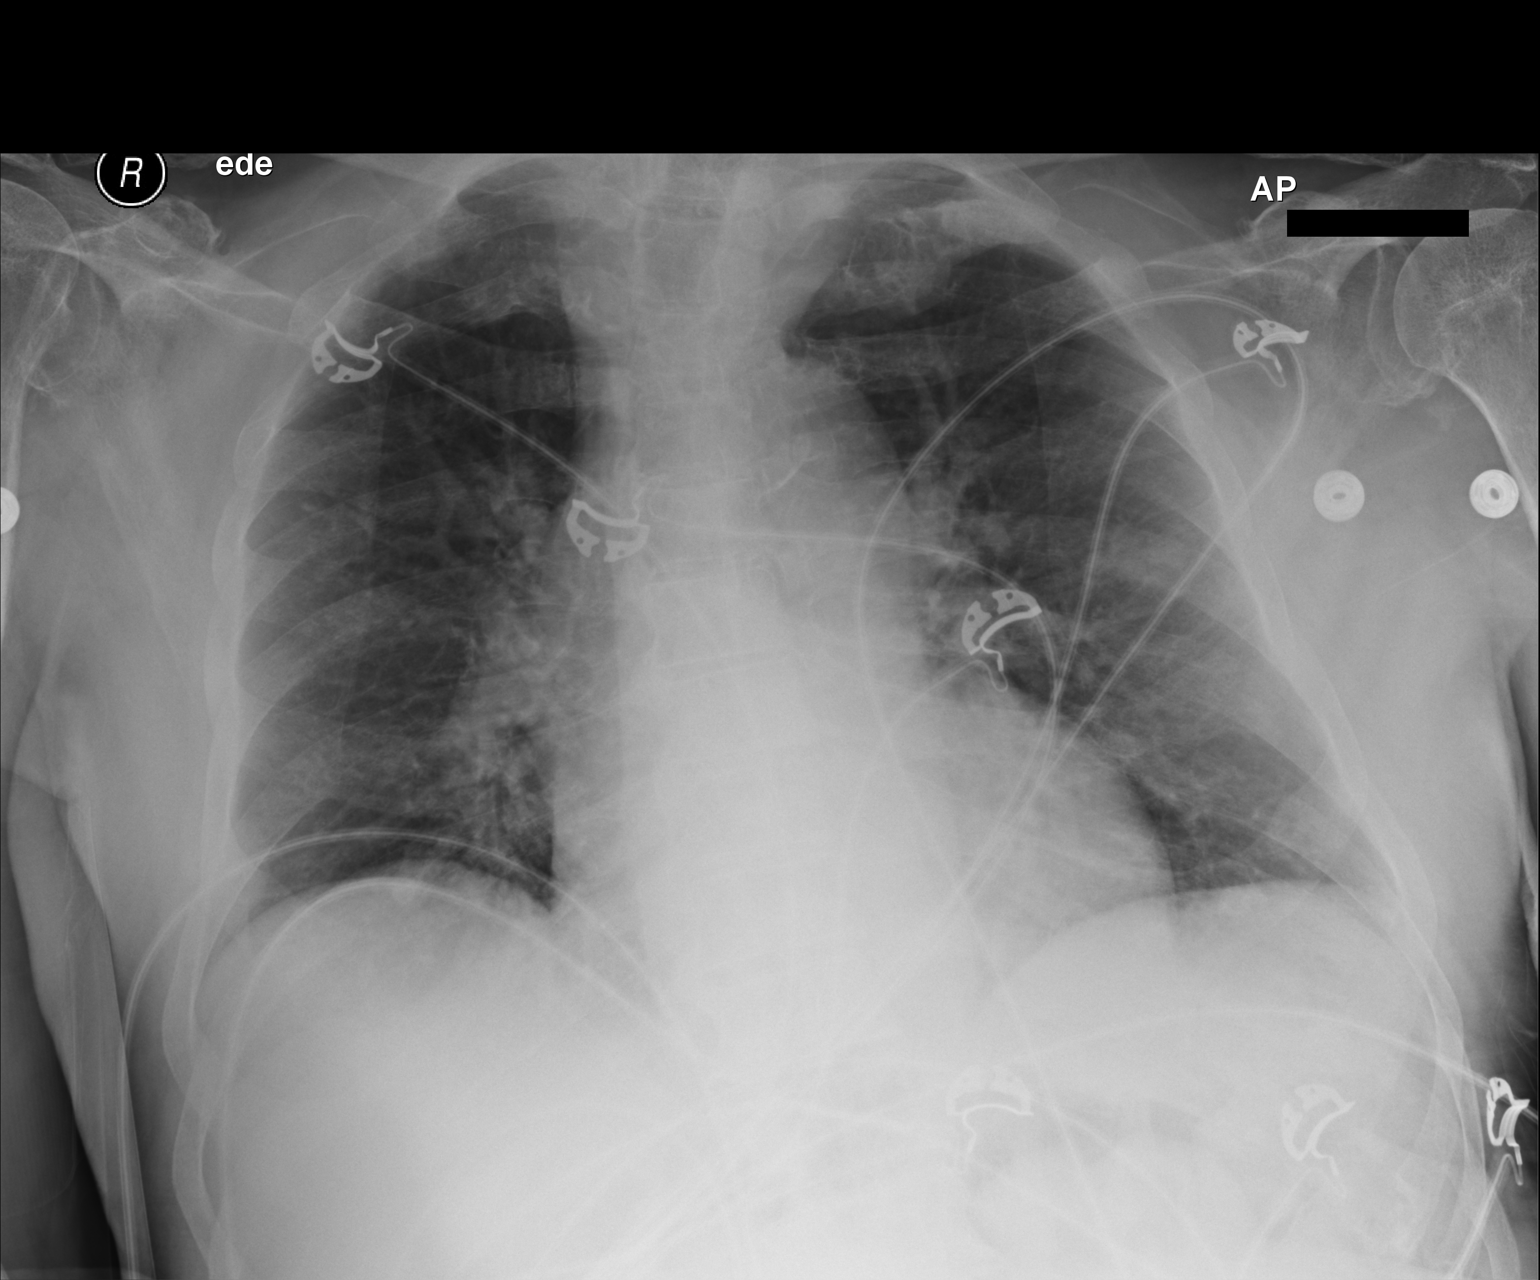

[1 of 1 positions shown; findings below may reference images not displayed]

FINDINGS: Low lung volumes. Cardiac silhouette is enlarged. Lungs are clear.
Degenerative changes within the shoulders. No acute osseus
abnormalities. Atherosclerotic calcifications in the aorta.
IMPRESSION: No active disease.

## 2016-03-19 IMAGING — US US ABDOMEN COMPLETE
1 series · 14 of 25 positions shown · non-contrast
Comparison: None.

CLINICAL DATA: Epigastric pain.

EXAM:
ULTRASOUND ABDOMEN COMPLETE

[Series 1: us abdomen complete · 0.26mm/px · 14 of 37 slices shown]
[im 1/37]
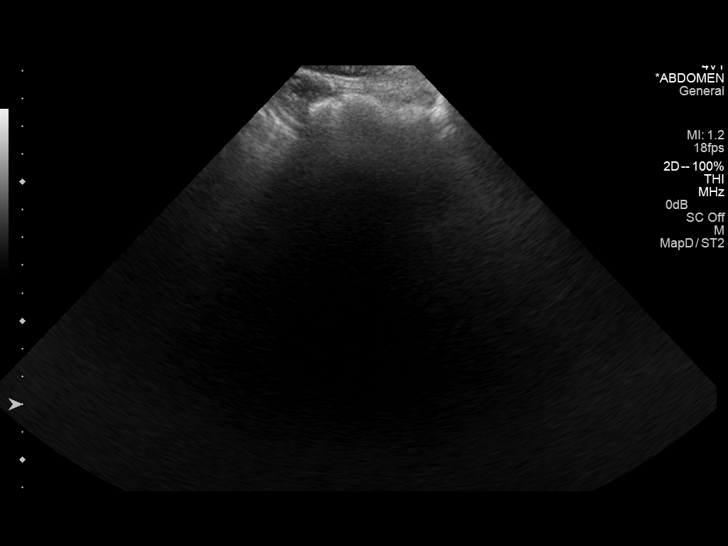
[im 4/37]
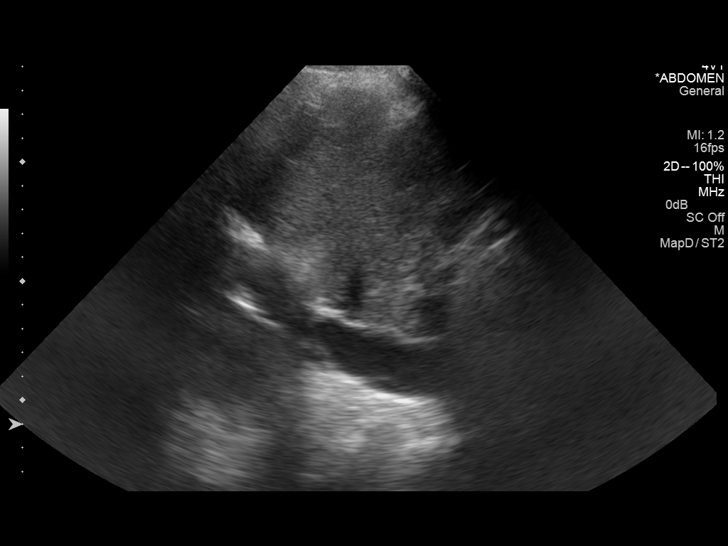
[im 7/37]
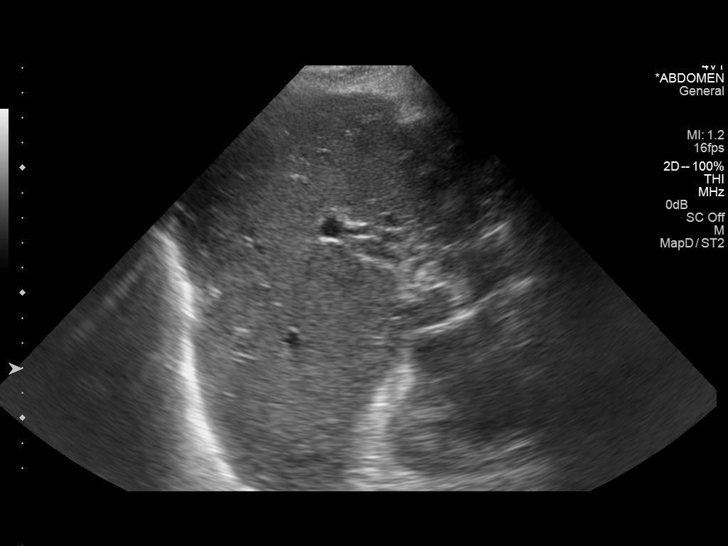
[im 10/37]
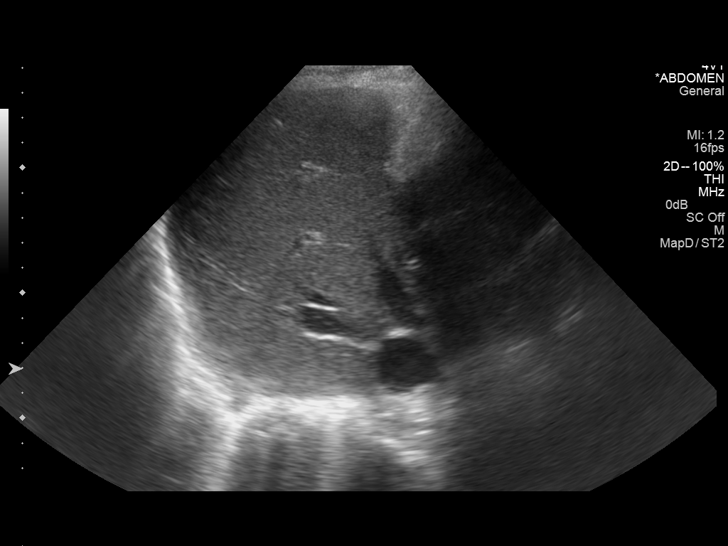
[im 13/37]
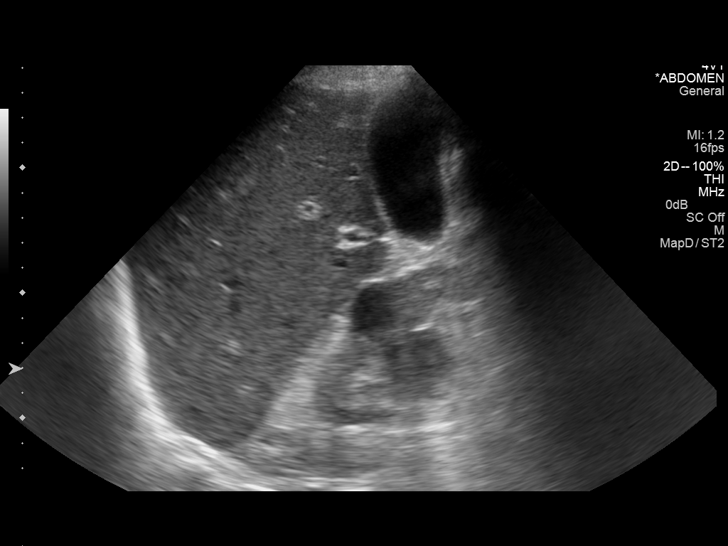
[im 14/37]
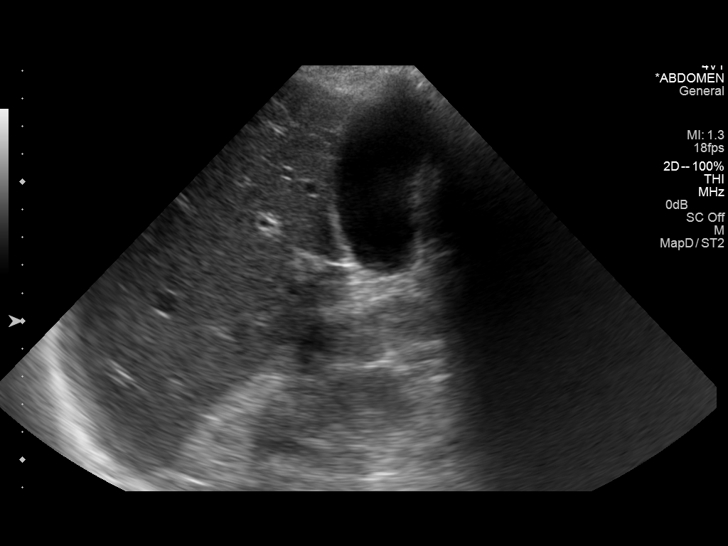
[im 17/37]
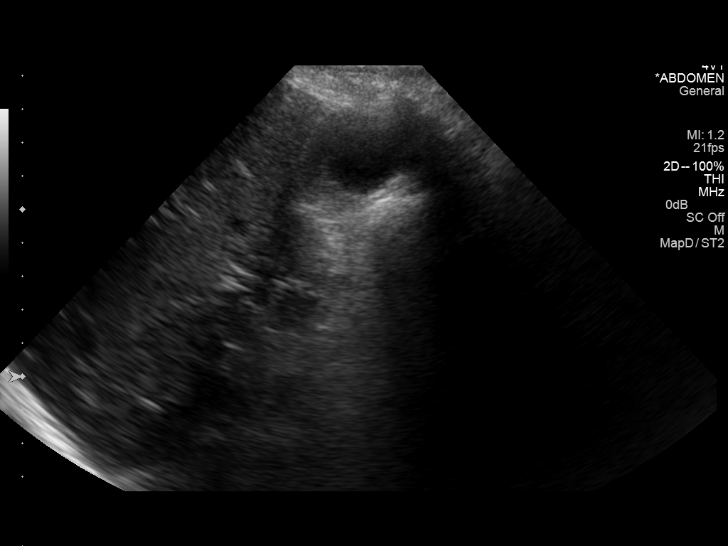
[im 20/37]
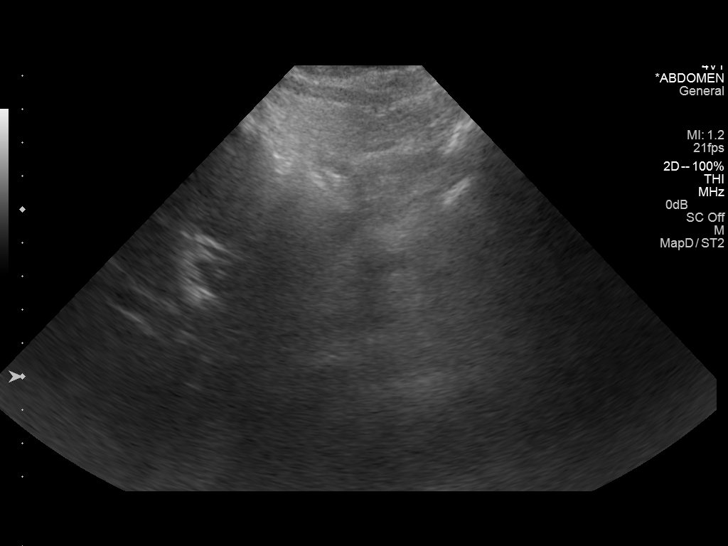
[im 23/37]
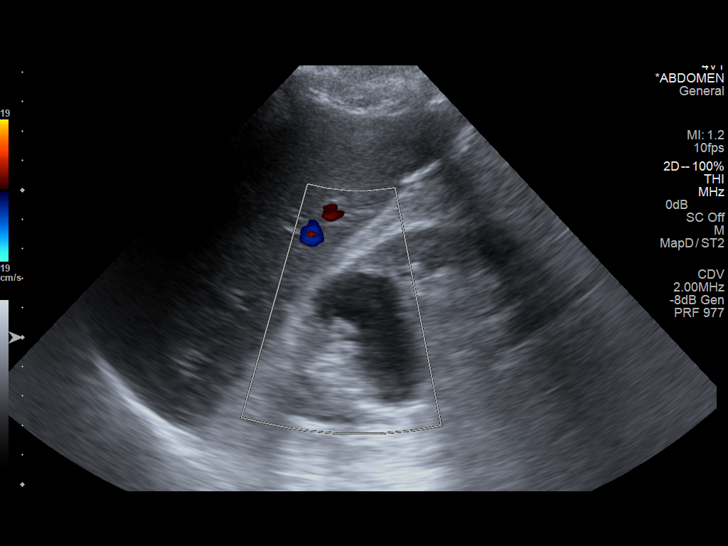
[im 25/37]
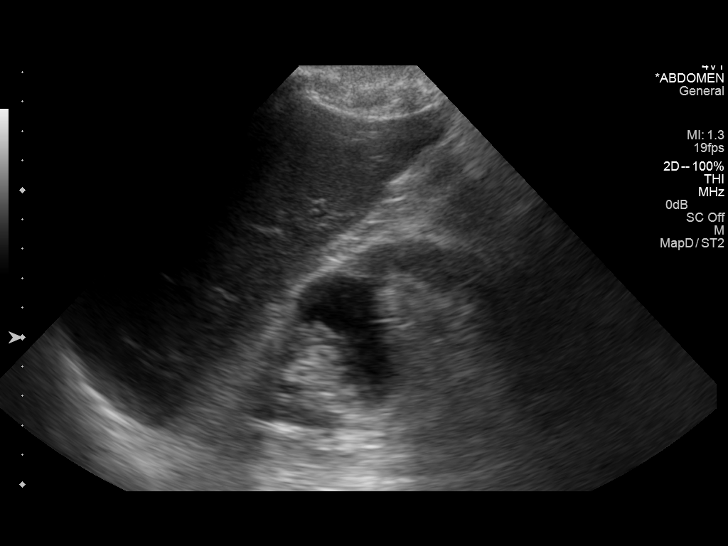
[im 28/37]
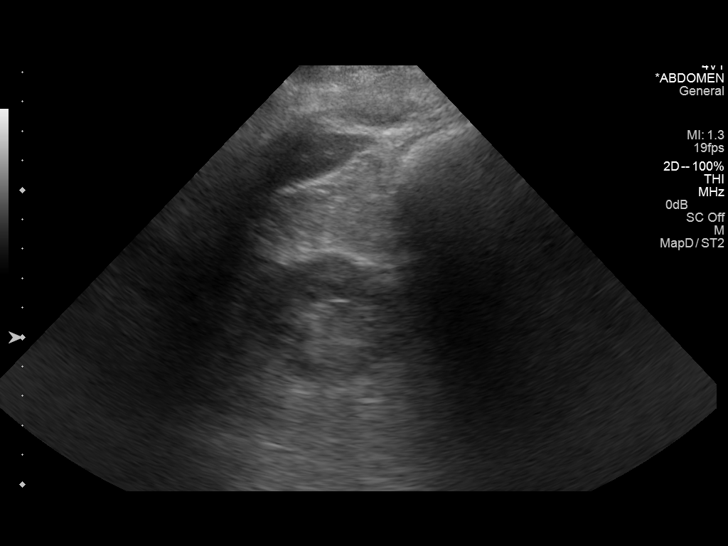
[im 31/37]
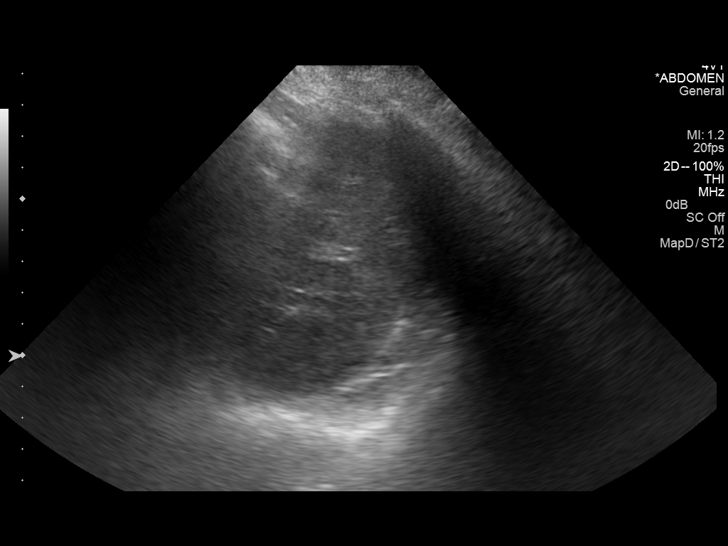
[im 34/37]
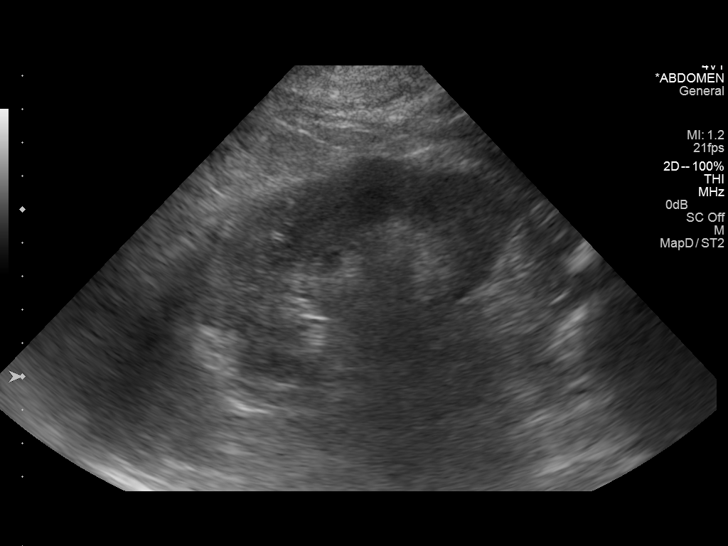
[im 37/37]
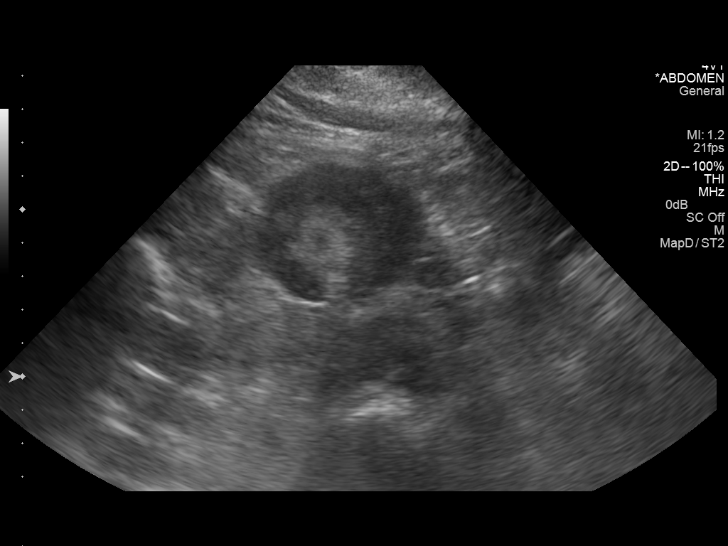

[14 of 25 positions shown; findings below may reference images not displayed]

FINDINGS: Gallbladder:

No gallstones or wall thickening visualized. No sonographic Murphy
sign noted.

Common bile duct:

Diameter: Measures 5.1 mm which is within normal limits.

Liver:

No focal lesion identified. Within normal limits in parenchymal
echogenicity.

IVC:

No abnormality visualized.

Pancreas:

Not visualized due to overlying bowel gas.

Spleen:

Size and appearance within normal limits.

Right Kidney:

Length: 11.2 cm. Large cyst is seen extending from parapelvic region
into cortex. Echogenicity within normal limits. No mass or
hydronephrosis visualized.

Left Kidney:

Length: 11.2 cm. Echogenicity within normal limits. No mass or
hydronephrosis visualized.

Abdominal aorta:

No aneurysm visualized.

Other findings:

None.
IMPRESSION: Right renal cyst. Pancreas not visualized due to overlying bowel
gas. No other definite abnormality seen in the abdomen.
